# Patient Record
Sex: Male | Born: 1995 | Race: White | Hispanic: No | Marital: Single | Smoking: Never smoker
Health system: Southern US, Community
[De-identification: ages and names within clinical notes are randomized; demographics above are authoritative.]

## PROBLEM LIST (undated history)

## (undated) DIAGNOSIS — J45909 Unspecified asthma, uncomplicated: Secondary | ICD-10-CM

---
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
## 2013-03-28 ENCOUNTER — Emergency Department: Payer: Self-pay | Admitting: Emergency Medicine

## 2013-11-18 ENCOUNTER — Emergency Department: Payer: Self-pay | Admitting: Internal Medicine

## 2013-11-27 ENCOUNTER — Emergency Department: Payer: Self-pay | Admitting: Emergency Medicine

## 2014-01-03 ENCOUNTER — Emergency Department: Payer: Self-pay | Admitting: Emergency Medicine

## 2014-01-03 LAB — ETHANOL
Ethanol %: 0.29 % — ABNORMAL HIGH (ref 0.000–0.080)
Ethanol: 290 mg/dL

## 2015-01-18 ENCOUNTER — Emergency Department: Payer: Self-pay | Admitting: Emergency Medicine

## 2015-02-28 ENCOUNTER — Emergency Department: Payer: Self-pay | Admitting: Emergency Medicine

## 2015-03-19 ENCOUNTER — Emergency Department
Admit: 2015-03-19 | Disposition: A | Discharge status: Home / Self Care | Payer: Self-pay | Admitting: Emergency Medicine

## 2015-06-21 ENCOUNTER — Emergency Department: Payer: Medicaid Other

## 2015-06-21 ENCOUNTER — Emergency Department
Admission: EM | Admit: 2015-06-21 | Discharge: 2015-06-21 | Disposition: A | Discharge status: Home / Self Care | Payer: Medicaid Other | Attending: Emergency Medicine | Admitting: Emergency Medicine

## 2015-06-21 DIAGNOSIS — F10120 Alcohol abuse with intoxication, uncomplicated: Secondary | ICD-10-CM | POA: Insufficient documentation

## 2015-06-21 DIAGNOSIS — Y998 Other external cause status: Secondary | ICD-10-CM | POA: Insufficient documentation

## 2015-06-21 DIAGNOSIS — Y9389 Activity, other specified: Secondary | ICD-10-CM | POA: Diagnosis not present

## 2015-06-21 DIAGNOSIS — Y9289 Other specified places as the place of occurrence of the external cause: Secondary | ICD-10-CM | POA: Diagnosis not present

## 2015-06-21 DIAGNOSIS — Z880 Allergy status to penicillin: Secondary | ICD-10-CM | POA: Insufficient documentation

## 2015-06-21 DIAGNOSIS — S0083XA Contusion of other part of head, initial encounter: Secondary | ICD-10-CM | POA: Diagnosis not present

## 2015-06-21 DIAGNOSIS — F10129 Alcohol abuse with intoxication, unspecified: Secondary | ICD-10-CM | POA: Diagnosis present

## 2015-06-21 DIAGNOSIS — F10920 Alcohol use, unspecified with intoxication, uncomplicated: Secondary | ICD-10-CM

## 2015-06-21 HISTORY — DX: Unspecified asthma, uncomplicated: J45.909

## 2015-06-21 LAB — CBC
HEMATOCRIT: 48.1 % (ref 40.0–52.0)
HEMOGLOBIN: 16.7 g/dL (ref 13.0–18.0)
MCH: 32.2 pg (ref 26.0–34.0)
MCHC: 34.8 g/dL (ref 32.0–36.0)
MCV: 92.5 fL (ref 80.0–100.0)
Platelets: 246 10*3/uL (ref 150–440)
RBC: 5.2 MIL/uL (ref 4.40–5.90)
RDW: 12.5 % (ref 11.5–14.5)
WBC: 9.4 10*3/uL (ref 3.8–10.6)

## 2015-06-21 LAB — COMPREHENSIVE METABOLIC PANEL
ALK PHOS: 90 U/L (ref 38–126)
ALT: 58 U/L (ref 17–63)
AST: 41 U/L (ref 15–41)
Albumin: 4.7 g/dL (ref 3.5–5.0)
Anion gap: 11 (ref 5–15)
BUN: 8 mg/dL (ref 6–20)
CHLORIDE: 101 mmol/L (ref 101–111)
CO2: 26 mmol/L (ref 22–32)
Calcium: 8.7 mg/dL — ABNORMAL LOW (ref 8.9–10.3)
Creatinine, Ser: 0.85 mg/dL (ref 0.61–1.24)
GFR calc Af Amer: >60 mL/min (ref >60)
GLUCOSE: 102 mg/dL — AB (ref 65–99)
Potassium: 3.6 mmol/L (ref 3.5–5.1)
SODIUM: 138 mmol/L (ref 135–145)
Total Bilirubin: 0.5 mg/dL (ref 0.3–1.2)
Total Protein: 8.4 g/dL — ABNORMAL HIGH (ref 6.5–8.1)

## 2015-06-21 LAB — ACETAMINOPHEN LEVEL: Acetaminophen (Tylenol), Serum: <10 ug/mL — ABNORMAL LOW (ref 10–30)

## 2015-06-21 LAB — SALICYLATE LEVEL: Salicylate Lvl: <4 mg/dL (ref 2.8–30.0)

## 2015-06-21 LAB — ETHANOL: Alcohol, Ethyl (B): 381 mg/dL (ref <5)

## 2015-06-21 MED ORDER — SODIUM CHLORIDE 0.9 % IV BOLUS (SEPSIS)
1000.0000 mL | Freq: Once | INTRAVENOUS | Status: AC
  Administered 2015-06-21: 1000 mL via INTRAVENOUS

## 2015-06-21 MED ORDER — IBUPROFEN 800 MG PO TABS
800.0000 mg | ORAL_TABLET | ORAL | Status: AC
  Administered 2015-06-21: 800 mg via ORAL

## 2015-06-21 MED ORDER — IBUPROFEN 800 MG PO TABS
ORAL_TABLET | ORAL | Status: AC
  Administered 2015-06-21: 800 mg via ORAL
  Filled 2015-06-21: qty 1

## 2015-06-21 MED ORDER — SODIUM CHLORIDE 0.9 % IV BOLUS (SEPSIS): 1000.0000 mL | Freq: Once | INTRAVENOUS | Status: DC
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
## 2015-06-21 NOTE — ED Notes (Signed)
Pt's mother at bedside and was able to provide pt's medical history.

## 2015-06-21 NOTE — ED Provider Notes (Signed)
-----------------------------------------   9:01 AM on 06/21/2015 -----------------------------------------  Patient is fully awake and alert ambulating in room without difficulty or distress. Presently exhibits no signs of intoxication. He has clear speech, normal gait, and stable vital signs. I did discuss with the patient my concerns that he may be using alcohol to excess and abusing it which can be dangerous and/or deadly. Patient agrees he does travel using alcohol, and he is aware of the risks of using alcohol to excess.  He denies being homicidal or suicidal. He states he was drinking recreationally. He states he is a little sore across the bridge the nose but otherwise no distress concerns.  Patient calling his mother for ride home.  Discharge once sober ride arrives.  Sharyn CreamerMark Gera Inboden, MD 06/21/15 [phone]
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
## 2016-03-02 ENCOUNTER — Emergency Department: Payer: Medicaid Other

## 2016-03-02 ENCOUNTER — Emergency Department
Admission: EM | Admit: 2016-03-02 | Discharge: 2016-03-02 | Disposition: A | Discharge status: Home / Self Care | Payer: Medicaid Other | Attending: Emergency Medicine | Admitting: Emergency Medicine

## 2016-03-02 DIAGNOSIS — S62002A Unspecified fracture of navicular [scaphoid] bone of left wrist, initial encounter for closed fracture: Secondary | ICD-10-CM

## 2016-03-02 DIAGNOSIS — S0990XA Unspecified injury of head, initial encounter: Secondary | ICD-10-CM | POA: Diagnosis present

## 2016-03-02 DIAGNOSIS — J45909 Unspecified asthma, uncomplicated: Secondary | ICD-10-CM | POA: Insufficient documentation

## 2016-03-02 DIAGNOSIS — Y998 Other external cause status: Secondary | ICD-10-CM | POA: Insufficient documentation

## 2016-03-02 DIAGNOSIS — S0083XA Contusion of other part of head, initial encounter: Secondary | ICD-10-CM | POA: Diagnosis not present

## 2016-03-02 DIAGNOSIS — S0093XA Contusion of unspecified part of head, initial encounter: Secondary | ICD-10-CM

## 2016-03-02 DIAGNOSIS — Y929 Unspecified place or not applicable: Secondary | ICD-10-CM | POA: Diagnosis not present

## 2016-03-02 DIAGNOSIS — Y9389 Activity, other specified: Secondary | ICD-10-CM | POA: Diagnosis not present

## 2016-03-02 DIAGNOSIS — S62032A Displaced fracture of proximal third of navicular [scaphoid] bone of left wrist, initial encounter for closed fracture: Secondary | ICD-10-CM | POA: Insufficient documentation

## 2016-03-02 MED ORDER — IBUPROFEN 800 MG PO TABS: 800.0000 mg | ORAL_TABLET | Freq: Three times a day (TID) | ORAL | Status: DC | PRN
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
## 2016-03-02 NOTE — Discharge Instructions (Signed)
Contusion A contusion is a deep bruise. Contusions are the result of a blunt injury to tissues and muscle fibers under the skin. The injury causes bleeding under the skin. The skin overlying the contusion may turn blue, purple, or yellow. Minor injuries will give you a painless contusion, but more severe contusions may stay painful and swollen for a few weeks.  CAUSES  This condition is usually caused by a blow, trauma, or direct force to an area of the body. SYMPTOMS  Symptoms of this condition include:  Swelling of the injured area.  Pain and tenderness in the injured area.  Discoloration. The area may have redness and then turn blue, purple, or yellow. DIAGNOSIS  This condition is diagnosed based on a physical exam and medical history. An X-ray, CT scan, or MRI may be needed to determine if there are any associated injuries, such as broken bones (fractures). TREATMENT  Specific treatment for this condition depends on what area of the body was injured. In general, the best treatment for a contusion is resting, icing, applying pressure to (compression), and elevating the injured area. This is often called the RICE strategy. Over-the-counter anti-inflammatory medicines may also be recommended for pain control.  HOME CARE INSTRUCTIONS   Rest the injured area.  If directed, apply ice to the injured area:  Put ice in a plastic bag.  Place a towel between your skin and the bag.  Leave the ice on for 20 minutes, 2-3 times per day.  If directed, apply light compression to the injured area using an elastic bandage. Make sure the bandage is not wrapped too tightly. Remove and reapply the bandage as directed by your health care provider.  If possible, raise (elevate) the injured area above the level of your heart while you are sitting or lying down.  Take over-the-counter and prescription medicines only as told by your health care provider. SEEK MEDICAL CARE IF:  Your symptoms do not  improve after several days of treatment.  Your symptoms get worse.  You have difficulty moving the injured area. SEEK IMMEDIATE MEDICAL CARE IF:   You have severe pain.  You have numbness in a hand or foot.  Your hand or foot turns pale or cold.   This information is not intended to replace advice given to you by your health care provider. Make sure you discuss any questions you have with your health care provider.   Document Released: 09/08/2005 Document Revised: 08/20/2015 Document Reviewed: 04/16/2015 Elsevier Interactive Patient Education 2016 [employer]  Cast or Splint Care Casts and splints support injured limbs and keep bones from moving while they heal.  HOME CARE  Keep the cast or splint uncovered during the drying period.  A plaster cast can take 24 to 48 hours to dry.  A fiberglass cast will dry in less than 1 hour.  Do not rest the cast on anything harder than a pillow for 24 hours.  Do not put weight on your injured limb. Do not put pressure on the cast. Wait for your doctor's approval.  Keep the cast or splint dry.  Cover the cast or splint with a plastic bag during baths or wet weather.  If you have a cast over your chest and belly (trunk), take sponge baths until the cast is taken off.  If your cast gets wet, dry it with a towel or blow dryer. Use the cool setting on the blow dryer.  Keep your cast or splint clean. Wash a dirty cast  with a damp cloth.  Do not put any objects under your cast or splint.  Do not scratch the skin under the cast with an object. If itching is a problem, use a blow dryer on a cool setting over the itchy area.  Do not trim or cut your cast.  Do not take out the padding from inside your cast.  Exercise your joints near the cast as told by your doctor.  Raise (elevate) your injured limb on 1 or 2 pillows for the first 1 to 3 days. GET HELP IF:  Your cast or splint cracks.  Your cast or splint is too tight or too  loose.  You itch badly under the cast.  Your cast gets wet or has a soft spot.  You have a bad smell coming from the cast.  You get an object stuck under the cast.  Your skin around the cast becomes red or sore.  You have new or more pain after the cast is put on. GET HELP RIGHT AWAY IF:  You have fluid leaking through the cast.  You cannot move your fingers or toes.  Your fingers or toes turn blue or white or are cool, painful, or puffy (swollen).  You have tingling or lose feeling (numbness) around the injured area.  You have bad pain or pressure under the cast.  You have trouble breathing or have shortness of breath.  You have chest pain.   This information is not intended to replace advice given to you by your health care provider. Make sure you discuss any questions you have with your health care provider.   Document Released: 03/31/2011 Document Revised: 08/01/2013 Document Reviewed: 06/07/2013 Elsevier Interactive Patient Education 2016 [employer]  Facial or Scalp Contusion A facial or scalp contusion is a deep bruise on the face or head. Injuries to the face and head generally cause a lot of swelling, especially around the eyes. Contusions are the result of an injury that caused bleeding under the skin. The contusion may turn blue, purple, or yellow. Minor injuries will give you a painless contusion, but more severe contusions may stay painful and swollen for a few weeks.  CAUSES  A facial or scalp contusion is caused by a blunt injury or trauma to the face or head area.  SIGNS AND SYMPTOMS   Swelling of the injured area.   Discoloration of the injured area.   Tenderness, soreness, or pain in the injured area.  DIAGNOSIS  The diagnosis can be made by taking a medical history and doing a physical exam. An X-ray exam, CT scan, or MRI may be needed to determine if there are any associated injuries, such as broken bones (fractures). TREATMENT  Often, the  best treatment for a facial or scalp contusion is applying cold compresses to the injured area. Over-the-counter medicines may also be recommended for pain control.  HOME CARE INSTRUCTIONS   Only take over-the-counter or prescription medicines as directed by your health care provider.   Apply ice to the injured area.   Put ice in a plastic bag.   Place a towel between your skin and the bag.   Leave the ice on for 20 minutes, 2-3 times a day.  SEEK MEDICAL CARE IF:  You have bite problems.   You have pain with chewing.   You are concerned about facial defects. SEEK IMMEDIATE MEDICAL CARE IF:  You have severe pain or a headache that is not relieved by medicine.   You  have unusual sleepiness, confusion, or personality changes.   You throw up (vomit).   You have a persistent nosebleed.   You have double vision or blurred vision.   You have fluid drainage from your nose or ear.   You have difficulty walking or using your arms or legs.  MAKE SURE YOU:   Understand these instructions.  Will watch your condition.  Will get help right away if you are not doing well or get worse.   This information is not intended to replace advice given to you by your health care provider. Make sure you discuss any questions you have with your health care provider.   Document Released: 01/06/2005 Document Revised: 12/20/2014 Document Reviewed: 07/12/2013 Elsevier Interactive Patient Education 2016 [employer]  Cryotherapy Cryotherapy means treatment with cold. Ice or gel packs can be used to reduce both pain and swelling. Ice is the most helpful within the first 24 to 48 hours after an injury or flare-up from overusing a muscle or joint. Sprains, strains, spasms, burning pain, shooting pain, and aches can all be eased with ice. Ice can also be used when recovering from surgery. Ice is effective, has very few side effects, and is safe for most people to use. PRECAUTIONS  Ice  is not a safe treatment option for people with:  Raynaud phenomenon. This is a condition affecting small blood vessels in the extremities. Exposure to cold may cause your problems to return.  Cold hypersensitivity. There are many forms of cold hypersensitivity, including:  Cold urticaria. Red, itchy hives appear on the skin when the tissues begin to warm after being iced.  Cold erythema. This is a red, itchy rash caused by exposure to cold.  Cold hemoglobinuria. Red blood cells break down when the tissues begin to warm after being iced. The hemoglobin that carry oxygen are passed into the urine because they cannot combine with blood proteins fast enough.  Numbness or altered sensitivity in the area being iced. If you have any of the following conditions, do not use ice until you have discussed cryotherapy with your caregiver:  Heart conditions, such as arrhythmia, angina, or chronic heart disease.  High blood pressure.  Healing wounds or open skin in the area being iced.  Current infections.  Rheumatoid arthritis.  Poor circulation.  Diabetes. Ice slows the blood flow in the region it is applied. This is beneficial when trying to stop inflamed tissues from spreading irritating chemicals to surrounding tissues. However, if you expose your skin to cold temperatures for too long or without the proper protection, you can damage your skin or nerves. Watch for signs of skin damage due to cold. HOME CARE INSTRUCTIONS Follow these tips to use ice and cold packs safely.  Place a dry or damp towel between the ice and skin. A damp towel will cool the skin more quickly, so you may need to shorten the time that the ice is used.  For a more rapid response, add gentle compression to the ice.  Ice for no more than 10 to 20 minutes at a time. The bonier the area you are icing, the less time it will take to get the benefits of ice.  Check your skin after 5 minutes to make sure there are no signs  of a poor response to cold or skin damage.  Rest 20 minutes or more between uses.  Once your skin is numb, you can end your treatment. You can test numbness by very lightly touching your skin.  The touch should be so light that you do not see the skin dimple from the pressure of your fingertip. When using ice, most people will feel these normal sensations in this order: cold, burning, aching, and numbness.  Do not use ice on someone who cannot communicate their responses to pain, such as small children or people with dementia. HOW TO MAKE AN ICE PACK Ice packs are the most common way to use ice therapy. Other methods include ice massage, ice baths, and cryosprays. Muscle creams that cause a cold, tingly feeling do not offer the same benefits that ice offers and should not be used as a substitute unless recommended by your caregiver. To make an ice pack, do one of the following:  Place crushed ice or a bag of frozen vegetables in a sealable plastic bag. Squeeze out the excess air. Place this bag inside another plastic bag. Slide the bag into a pillowcase or place a damp towel between your skin and the bag.  Mix 3 parts water with 1 part rubbing alcohol. Freeze the mixture in a sealable plastic bag. When you remove the mixture from the freezer, it will be slushy. Squeeze out the excess air. Place this bag inside another plastic bag. Slide the bag into a pillowcase or place a damp towel between your skin and the bag. SEEK MEDICAL CARE IF:  You develop white spots on your skin. This may give the skin a blotchy (mottled) appearance.  Your skin turns blue or pale.  Your skin becomes waxy or hard.  Your swelling gets worse. MAKE SURE YOU:   Understand these instructions.  Will watch your condition.  Will get help right away if you are not doing well or get worse.   This information is not intended to replace advice given to you by your health care provider. Make sure you discuss any questions  you have with your health care provider.   Document Released: 07/26/2011 Document Revised: 12/20/2014 Document Reviewed: 07/26/2011 Elsevier Interactive Patient Education 2016 [employer]  Scaphoid Fracture, Wrist A fracture is a break in the bone. The bone you have broken often does not show up as a fracture on x-ray until later on in the healing phase. This bone is called the scaphoid bone. With this bone, your caregiver will often cast or splint your wrist as though it is fractured, even if a fracture is not seen on the x-ray. This is often done with wrist injuries in which there is tenderness at the base of the thumb. An x-ray at 1-3 weeks after your injury may confirm this fracture. A cast or splint is used to protect and keep your injured bone in good position for healing. The cast or splint will be on generally for about 6 to 16 weeks, depending on your health, age, the fracture location and how quickly you heal. Another name for the scaphoid bone is the navicular bone. HOME CARE INSTRUCTIONS   To lessen the swelling and pain, keep the injured part elevated above your heart while sitting or lying down.  Apply ice to the injury for 15-20 minutes, 03-04 times per day while awake, for 2 days. Put the ice in a plastic bag and place a thin towel between the bag of ice and your cast.  If you have a plaster or fiberglass cast or splint:  Do not try to scratch the skin under the cast using sharp or pointed objects.  Check the skin around the cast every day. You may  put lotion on any red or sore areas.  Keep your cast or splint dry and clean.  If you have a plaster splint:  Wear the splint as directed.  You may loosen the elastic bandage around the splint if your fingers become numb, tingle, or turn cold or blue.  If you have been put in a removable splint, wear and use as directed.  Do not put pressure on any part of your cast or splint; it may deform or break. Rest your cast or splint  only on a pillow the first 24 hours until it is fully hardened.  Your cast or splint can be protected during bathing with a plastic bag. Do not lower the cast or splint into water.  Only take over-the-counter or prescription medicines for pain, discomfort, or fever as directed by your caregiver.  If your caregiver has given you a follow up appointment, it is very important to keep that appointment. Not keeping the appointment could result in chronic pain and decreased function. If there is any problem keeping the appointment, you must call back to this facility for assistance. SEEK IMMEDIATE MEDICAL CARE IF:   Your cast gets damaged, wet or breaks.  You have continued severe pain or more swelling than you did before the cast or splint was put on.  Your skin or nails below the injury turn blue or gray, or feel cold or numb.  You have tingling or burning pain in your fingers or increasing pain with movement of your fingers   This information is not intended to replace advice given to you by your health care provider. Make sure you discuss any questions you have with your health care provider.   Document Released: 11/19/2002 Document Revised: 02/21/2012 Document Reviewed: 06/11/2015 Elsevier Interactive Patient Education [employer]
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
## 2016-06-27 ENCOUNTER — Encounter: Payer: Self-pay | Admitting: Emergency Medicine

## 2016-06-27 DIAGNOSIS — R1110 Vomiting, unspecified: Secondary | ICD-10-CM | POA: Diagnosis not present

## 2016-06-27 DIAGNOSIS — F17290 Nicotine dependence, other tobacco product, uncomplicated: Secondary | ICD-10-CM | POA: Insufficient documentation

## 2016-06-27 DIAGNOSIS — E860 Dehydration: Secondary | ICD-10-CM | POA: Insufficient documentation

## 2016-06-27 DIAGNOSIS — J45909 Unspecified asthma, uncomplicated: Secondary | ICD-10-CM | POA: Diagnosis not present

## 2016-06-27 DIAGNOSIS — R1084 Generalized abdominal pain: Secondary | ICD-10-CM | POA: Diagnosis present

## 2016-06-27 DIAGNOSIS — M6282 Rhabdomyolysis: Secondary | ICD-10-CM | POA: Insufficient documentation

## 2016-06-27 LAB — CBC
HCT: 53.2 % — ABNORMAL HIGH (ref 40.0–52.0)
Hemoglobin: 18.7 g/dL — ABNORMAL HIGH (ref 13.0–18.0)
MCH: 31.9 pg (ref 26.0–34.0)
MCHC: 35.2 g/dL (ref 32.0–36.0)
MCV: 90.5 fL (ref 80.0–100.0)
PLATELETS: 296 10*3/uL (ref 150–440)
RBC: 5.88 MIL/uL (ref 4.40–5.90)
RDW: 12.7 % (ref 11.5–14.5)
WBC: 14.8 10*3/uL — AB (ref 3.8–10.6)

## 2016-06-27 LAB — COMPREHENSIVE METABOLIC PANEL
ALT: 80 U/L — AB (ref 17–63)
AST: 60 U/L — ABNORMAL HIGH (ref 15–41)
Albumin: 5.9 g/dL — ABNORMAL HIGH (ref 3.5–5.0)
Alkaline Phosphatase: 98 U/L (ref 38–126)
Anion gap: 15 (ref 5–15)
BILIRUBIN TOTAL: 1.2 mg/dL (ref 0.3–1.2)
BUN: 35 mg/dL — ABNORMAL HIGH (ref 6–20)
CHLORIDE: 92 mmol/L — AB (ref 101–111)
CO2: 26 mmol/L (ref 22–32)
CREATININE: 2.11 mg/dL — AB (ref 0.61–1.24)
Calcium: 10.3 mg/dL (ref 8.9–10.3)
GFR, EST AFRICAN AMERICAN: 50 mL/min — AB (ref >60)
GFR, EST NON AFRICAN AMERICAN: 43 mL/min — AB (ref >60)
Glucose, Bld: 144 mg/dL — ABNORMAL HIGH (ref 65–99)
Potassium: 4.3 mmol/L (ref 3.5–5.1)
Sodium: 133 mmol/L — ABNORMAL LOW (ref 135–145)
TOTAL PROTEIN: 9.6 g/dL — AB (ref 6.5–8.1)

## 2016-06-27 LAB — TROPONIN I

## 2016-06-27 NOTE — ED Notes (Signed)
Pt states vomiting since wendsday. Pt states has generalized abdominal pain. Pt states yesterday had "purple blood in my vomit". Pt with history of etoh abuse, has not had etoh in one week. Pt denies diarrhea, last bowel movement per pt was brown.

## 2016-06-28 ENCOUNTER — Emergency Department
Admission: EM | Admit: 2016-06-28 | Discharge: 2016-06-28 | Disposition: A | Discharge status: Home / Self Care | Payer: Medicaid Other | Attending: Emergency Medicine | Admitting: Emergency Medicine

## 2016-06-28 ENCOUNTER — Emergency Department: Payer: Medicaid Other

## 2016-06-28 DIAGNOSIS — M6282 Rhabdomyolysis: Secondary | ICD-10-CM

## 2016-06-28 DIAGNOSIS — E860 Dehydration: Secondary | ICD-10-CM

## 2016-06-28 LAB — LIPASE, BLOOD: LIPASE: 14 U/L (ref 11–51)

## 2016-06-28 LAB — BASIC METABOLIC PANEL
Anion gap: 7 (ref 5–15)
BUN: 28 mg/dL — AB (ref 6–20)
CALCIUM: 8.7 mg/dL — AB (ref 8.9–10.3)
CHLORIDE: 96 mmol/L — AB (ref 101–111)
CO2: 30 mmol/L (ref 22–32)
CREATININE: 1.33 mg/dL — AB (ref 0.61–1.24)
Glucose, Bld: 99 mg/dL (ref 65–99)
Potassium: 3.7 mmol/L (ref 3.5–5.1)
SODIUM: 133 mmol/L — AB (ref 135–145)

## 2016-06-28 LAB — TYPE AND SCREEN
ABO/RH(D): A POS
ANTIBODY SCREEN: NEGATIVE

## 2016-06-28 LAB — CK
CK TOTAL: 748 U/L — AB (ref 49–397)
CK TOTAL: 544 U/L — AB (ref 49–397)

## 2016-06-28 LAB — ETHANOL

## 2016-06-28 MED ORDER — DIATRIZOATE MEGLUMINE & SODIUM 66-10 % PO SOLN
15.0000 mL | ORAL | Status: AC
  Administered 2016-06-28 (×2): 15 mL via ORAL

## 2016-06-28 MED ORDER — SODIUM CHLORIDE 0.9 % IV BOLUS (SEPSIS)
1000.0000 mL | Freq: Once | INTRAVENOUS | Status: AC
  Administered 2016-06-28: 1000 mL via INTRAVENOUS

## 2016-06-28 MED ORDER — ONDANSETRON HCL 4 MG/2ML IJ SOLN
4.0000 mg | Freq: Once | INTRAMUSCULAR | Status: AC
  Administered 2016-06-28: 4 mg via INTRAVENOUS
  Filled 2016-06-28: qty 2

## 2016-06-28 NOTE — ED Notes (Signed)
Patient transported to CT 

## 2016-06-28 NOTE — ED Provider Notes (Signed)
Patient signed out to me by Dr. Manson PasseyBrown, he asked me to follow-up on lipase and discharge if normal. Lipase is normal. Vitals normal okay for discharge  Jene Everyobert Marika Mahaffy, MD 06/28/16 [phone]

## 2016-06-28 NOTE — Discharge Instructions (Signed)

## 2016-06-28 NOTE — ED Notes (Signed)
Pt is alert and oriented. NAD. Mother at bedside. Respirations unlabored. Skin warm dry and pink.

## 2016-06-28 NOTE — ED Notes (Signed)
Given water, ok per dr Cyril Loosenkinner

## 2016-06-28 NOTE — ED Provider Notes (Signed)
[facility] Emergency Department Provider Note  ____________________________________________  Time seen: 2:00 AM  I have reviewed the triage vital signs and the nursing notes.   HISTORY  Chief Complaint GI Bleeding     HPI Lawrence A Wyvonnia DuskyDuckworth Jr. is a 20 y.o. male presents with generalized abdominal discomfortcurrently 5 out of 10 accompanied by vomiting 4 days. Patient admits to history of heavy alcohol intake daily however states that he has not drank in approximately one week. 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     Past Medical History  Diagnosis Date  . Asthma     There are no active problems to display for this patient.   No past surgical history on file.  Current Outpatient Rx  Name  Route  Sig  Dispense  Refill  . ibuprofen (ADVIL,MOTRIN) 800 MG tablet   Oral   Take 1 tablet (800 mg total) by mouth every 8 (eight) hours as needed. Patient not taking: Reported on 06/28/2016   30 tablet   0     Allergies Penicillins  No family history on file.  Social History Social History  Substance Use Topics  . Smoking status: Never Smoker   . Smokeless tobacco: Current User  . Alcohol Use: Yes    Review of Systems  Constitutional: Negative for fever. Eyes: Negative for visual changes. ENT: Negative for sore throat. Cardiovascular: Negative for chest pain. Respiratory: Negative for shortness of breath. Gastrointestinal: Positive for abdominal pain and vomiting Genitourinary: Negative for dysuria. Musculoskeletal: Negative for back pain. Skin: Negative for rash. Neurological: Negative for headaches, focal weakness or numbness.   10-point ROS otherwise negative.  ____________________________________________   PHYSICAL EXAM:  VITAL SIGNS: ED Triage Vitals  Enc Vitals Group     BP 06/27/16 2135 128/77 mmHg     Pulse Rate 06/27/16 2135 100     Resp 06/27/16 2135  16     Temp 06/27/16 2135 98.7 F (37.1 C)     Temp src --      SpO2 06/27/16 2135 100 %     Weight 06/27/16 2135 200 lb (90.719 kg)     Height 06/27/16 2135 5\' 7"  (1.702 m)     Head Cir --      Peak Flow --      Pain Score 06/27/16 2136 6     Pain Loc --      Pain Edu? --      Excl. in GC? --     Constitutional: Alert and oriented. Well appearing and in no distress. Eyes: Conjunctivae are normal. PERRL. Normal extraocular movements. ENT   Head: Normocephalic and atraumatic.   Nose: No congestion/rhinnorhea.   Mouth/Throat: Mucous membranes are moist.   Neck: No stridor. Hematological/Lymphatic/Immunilogical: No cervical lymphadenopathy. Cardiovascular: Normal rate, regular rhythm. Normal and symmetric distal pulses are present in all extremities. No murmurs, rubs, or gallops. Respiratory: Normal respiratory effort without tachypnea nor retractions. Breath sounds are clear and equal bilaterally. No wheezes/rales/rhonchi. Gastrointestinal: Soft and nontender. No distention. There is no CVA tenderness. Genitourinary: deferred Musculoskeletal: Nontender with normal range of motion in all extremities. No joint effusions.  No lower extremity tenderness nor edema. Neurologic:  Normal speech and language. No gross focal neurologic deficits are appreciated. Speech is normal.  Skin:  Skin is warm, dry and intact. No rash noted. Psychiatric: Mood and affect are normal. Speech and behavior are normal. Patient exhibits appropriate insight  and judgment.  ____________________________________________    LABS (pertinent positives/negatives)  Labs Reviewed  COMPREHENSIVE METABOLIC PANEL - Abnormal; Notable for the following:    Sodium 133 (*)    Chloride 92 (*)    Glucose, Bld 144 (*)    BUN 35 (*)    Creatinine, Ser 2.11 (*)    Total Protein 9.6 (*)    Albumin 5.9 (*)    AST 60 (*)    ALT 80 (*)    GFR calc non Af Amer 43 (*)    GFR calc Af Amer 50 (*)    All other  components within normal limits  CBC - Abnormal; Notable for the following:    WBC 14.8 (*)    Hemoglobin 18.7 (*)    HCT 53.2 (*)    All other components within normal limits  CK - Abnormal; Notable for the following:    Total CK 748 (*)    All other components within normal limits  TROPONIN I  ETHANOL  BASIC METABOLIC PANEL  CK  LIPASE, BLOOD  POC OCCULT BLOOD, ED  TYPE AND SCREEN     ____________________________________________   EKG  ED ECG REPORT I, Lawrence Griffith, the attending physician, personally viewed and interpreted this ECG.   Date: 06/28/2016  EKG Time: 9:42PM  Rate: 112  Rhythm: Sinus tachycardia  Axis: Normal  Intervals: Normal  ST&T Change: None   ____________________________________________    RADIOLOGY   CT Abdomen Pelvis Wo Contrast (Final result) Result time: 06/28/16 05:08:04   Final result by Rad Results In Interface (06/28/16 05:08:04)   Narrative:   CLINICAL DATA: Right lower quadrant and left lower quadrant abdominal pain.  EXAM: CT ABDOMEN AND PELVIS WITHOUT CONTRAST  TECHNIQUE: Multidetector CT imaging of the abdomen and pelvis was performed following the standard protocol without IV contrast.  COMPARISON: None.  FINDINGS: Lower chest and abdominal wall: No contributory findings.  Hepatobiliary: No focal liver abnormality.No evidence of biliary obstruction or stone.  Pancreas: Unremarkable.  Spleen: Unremarkable.  Adrenals/Urinary Tract: Negative adrenals. No hydronephrosis or stone. Unremarkable bladder.  Stomach/Bowel: No obstruction or inflammation. No appendicitis.  Reproductive:No pathologic findings.  Vascular/Lymphatic: No acute vascular abnormality. No mass or adenopathy.  Other: No ascites or pneumoperitoneum.  Musculoskeletal: Ossification contiguous with the anterior subtrochanteric left femur which does not have medullary continuity and is new since 2007 radiography. This has the  appearance of heterotopic ossification. No associated mass lesion or aggressive periosteal change.  IMPRESSION: 1. No acute finding. No explanation for abdominal pain. 2. 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   Electronically Signed By: Marnee Spring M.D. On: 06/28/2016 05:08            Procedures     INITIAL IMPRESSION / ASSESSMENT AND PLAN / ED COURSE  Pertinent labs & imaging results that were available during my care of the patient were reviewed by me and considered in my medical decision making (see chart for details).  Patient received 2 L IV normal saline with improvement of creatinine from 2.1 1.33 CK total improved from 740 07/18/1945. Lipase pending patient's care transferred to Dr. Cyril Loosen  ____________________________________________   FINAL CLINICAL IMPRESSION(S) / ED DIAGNOSES  Final diagnoses:  Non-traumatic rhabdomyolysis      Darci Current, MD 06/28/16 [phone]

## 2017-05-25 ENCOUNTER — Emergency Department: Payer: Medicaid Other

## 2017-05-25 ENCOUNTER — Emergency Department
Admission: EM | Admit: 2017-05-25 | Discharge: 2017-05-25 | Disposition: A | Discharge status: Home / Self Care | Payer: Medicaid Other | Attending: Emergency Medicine | Admitting: Emergency Medicine

## 2017-05-25 ENCOUNTER — Encounter: Payer: Self-pay | Admitting: Emergency Medicine

## 2017-05-25 DIAGNOSIS — Y9269 Other specified industrial and construction area as the place of occurrence of the external cause: Secondary | ICD-10-CM | POA: Diagnosis not present

## 2017-05-25 DIAGNOSIS — Y93H3 Activity, building and construction: Secondary | ICD-10-CM | POA: Insufficient documentation

## 2017-05-25 DIAGNOSIS — J45909 Unspecified asthma, uncomplicated: Secondary | ICD-10-CM | POA: Diagnosis not present

## 2017-05-25 DIAGNOSIS — Y33XXXA Other specified events, undetermined intent, initial encounter: Secondary | ICD-10-CM | POA: Insufficient documentation

## 2017-05-25 DIAGNOSIS — S62032K Displaced fracture of proximal third of navicular [scaphoid] bone of left wrist, subsequent encounter for fracture with nonunion: Secondary | ICD-10-CM | POA: Insufficient documentation

## 2017-05-25 DIAGNOSIS — S6992XA Unspecified injury of left wrist, hand and finger(s), initial encounter: Secondary | ICD-10-CM | POA: Diagnosis present

## 2017-05-25 DIAGNOSIS — Y990 Civilian activity done for income or pay: Secondary | ICD-10-CM | POA: Insufficient documentation

## 2017-05-25 MED ORDER — PREDNISONE 10 MG PO TABS: ORAL_TABLET | ORAL | 0 refills | Status: DC

## 2017-05-25 NOTE — Discharge Instructions (Signed)
Wear a wrist brace for added support. Begin taking prednisone as directed beginning with 6 tablets today and tapering down. You  may use ice to the wrist as needed for pain. You will need to follow up with Dr. Rosita KeaMenz for your nonhealing fracture

## 2017-05-25 NOTE — ED Triage Notes (Addendum)
Pt reports left wrist pain x1 week. Reports he works in [employer]. Denies known injury.

## 2017-05-25 NOTE — ED Provider Notes (Signed)
[facility] Emergency Department Provider Note ____________________________________________  Time seen: 10:32 AM  I have reviewed the triage vital signs and the nursing notes.  HISTORY  Chief Complaint  Wrist Pain   HPI Lawrence Griffith. is a 21 y.o. male is here complaining of left wrist pain for 1 week. Patient states that he is in [employer] work and does not recall an injury. He has been taking over-the-counter medication without any relief of his pain. He states that he continues to have pain even when he is trying to sleep. He rates his pain as 9/10.    Past Medical History:  Diagnosis Date  . Asthma     There are no active problems to display for this patient.   No past surgical history on file.  Prior to Admission medications   Medication Sig Start Date End Date Taking? Authorizing Provider  predniSONE (DELTASONE) 10 MG tablet Take 6 tablets  today, on day 2 take 5 tablets, day 3 take 4 tablets, day 4 take 3 tablets, day 5 take  2 tablets and 1 tablet the last day 05/25/17   Tommi Rumps, PA-C    Allergies Penicillins  No family history on file.  Social History Social History  Substance Use Topics  . Smoking status: Never Smoker  . Smokeless tobacco: Current User  . Alcohol use Yes    Review of Systems  Constitutional: Negative for fever. Cardiovascular: Negative for chest pain. Respiratory: Negative for shortness of breath. Musculoskeletal: Positive for left wrist pain. Skin: Negative for rash. Neurological: Negative for  focal weakness or numbness. ____________________________________________  PHYSICAL EXAM:  VITAL SIGNS: ED Triage Vitals [05/25/17 1027]  Enc Vitals Group     BP      Pulse      Resp      Temp      Temp src      SpO2      Weight      Height      Head Circumference      Peak Flow      Pain Score 9     Pain Loc      Pain Edu?      Excl. in GC?     Constitutional: Alert and oriented.  Well appearing and in no distress. Head: Normocephalic and atraumatic. Eyes: Conjunctivae are normal.  Neck: No stridor Cardiovascular: Normal rate, regular rhythm. Normal distal pulses. Respiratory: Normal respiratory effort. No wheezes/rales/rhonchi. Musculoskeletal: Examination of the left wrist there is no gross deformity and no soft tissue swelling present. There is no obvious signs of injury. Patient is able to flex and extend without restriction. He states it is point tender to palpation especially on the distal radial aspect. Motor sensory function to all digits is within normal limits. Good muscle strength and tone. Neurologic:  Normal gait without ataxia. Normal speech and language. No gross focal neurologic deficits are appreciated. Skin:  Skin is warm, dry and intact. No erythema, ecchymosis or abrasions seen. Psychiatric: Mood and affect are normal. Patient exhibits appropriate insight and judgment.    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 No acute abnormality identified.   I, Tommi Rumps, personally viewed and evaluated these images (plain radiographs) as part of my medical decision making, as well as reviewing the written report by the radiologist.   INITIAL IMPRESSION / ASSESSMENT AND PLAN / ED COURSE  Patient states he is unaware  of any past history of injury to his left ribs which would've caused a fracture. Patient was given a prescription for prednisone 60 mg 6 day taper for his inflammation and pain. He is also placed in a Velcro cockup wrist splint. We discussed following up with Dr. Rosita KeaMenz who is on-call for orthopedics. He is encouraged to call make an appointment. He is also wetting take Tylenol with this medication.    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

## 2017-05-25 NOTE — ED Notes (Signed)
See triage note  States he works [employer]  Developed pain to left wrist several weeks ago  But pain has increased this week  Denies any specific injury  Positive pulses

## 2017-09-07 ENCOUNTER — Emergency Department: Payer: Medicaid Other

## 2017-09-07 ENCOUNTER — Encounter: Payer: Self-pay | Admitting: Emergency Medicine

## 2017-09-07 ENCOUNTER — Emergency Department
Admission: EM | Admit: 2017-09-07 | Discharge: 2017-09-07 | Disposition: A | Discharge status: Home / Self Care | Payer: Medicaid Other | Attending: Emergency Medicine | Admitting: Emergency Medicine

## 2017-09-07 DIAGNOSIS — M25511 Pain in right shoulder: Secondary | ICD-10-CM

## 2017-09-07 DIAGNOSIS — J45909 Unspecified asthma, uncomplicated: Secondary | ICD-10-CM | POA: Diagnosis not present

## 2017-09-07 MED ORDER — KETOROLAC TROMETHAMINE 60 MG/2ML IM SOLN
60.0000 mg | Freq: Once | INTRAMUSCULAR | Status: AC
  Administered 2017-09-07: 60 mg via INTRAMUSCULAR
  Filled 2017-09-07: qty 2
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
## 2017-09-07 NOTE — Discharge Instructions (Signed)
Please follow up with your primary care physician.

## 2017-09-28 ENCOUNTER — Emergency Department
Admission: EM | Admit: 2017-09-28 | Discharge: 2017-09-29 | Disposition: A | Discharge status: Home / Self Care | Payer: Medicaid Other | Attending: Emergency Medicine | Admitting: Emergency Medicine

## 2017-09-28 ENCOUNTER — Emergency Department: Payer: Medicaid Other

## 2017-09-28 ENCOUNTER — Encounter: Payer: Self-pay | Admitting: *Deleted

## 2017-09-28 DIAGNOSIS — M25531 Pain in right wrist: Secondary | ICD-10-CM | POA: Diagnosis not present

## 2017-09-28 DIAGNOSIS — M25511 Pain in right shoulder: Secondary | ICD-10-CM | POA: Insufficient documentation

## 2017-09-28 DIAGNOSIS — J45909 Unspecified asthma, uncomplicated: Secondary | ICD-10-CM | POA: Diagnosis not present

## 2017-09-28 MED ORDER — KETOROLAC TROMETHAMINE 30 MG/ML IJ SOLN
INTRAMUSCULAR | Status: AC
  Filled 2017-09-28: qty 1

## 2017-09-28 MED ORDER — KETOROLAC TROMETHAMINE 30 MG/ML IJ SOLN
60.0000 mg | Freq: Once | INTRAMUSCULAR | Status: AC
Start: 2017-09-28 — End: 2017-09-28
  Administered 2017-09-28: 60 mg via INTRAMUSCULAR
  Filled 2017-09-28: qty 2
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
## 2017-09-28 NOTE — ED Notes (Signed)
ED Provider at bedside. 

## 2017-09-29 ENCOUNTER — Emergency Department: Payer: Medicaid Other

## 2017-09-29 MED ORDER — IBUPROFEN 800 MG PO TABS: 800.0000 mg | ORAL_TABLET | Freq: Three times a day (TID) | ORAL | 0 refills | Status: DC | PRN

## 2017-09-29 NOTE — Discharge Instructions (Signed)
You may continue to use the sling that you already have. You may take Motrin as needed for pain. Return to the ER for worsening symptoms, persistent vomiting, difficulty breathing or other concerns.

## 2017-10-20 ENCOUNTER — Emergency Department
Admission: EM | Admit: 2017-10-20 | Discharge: 2017-10-20 | Disposition: A | Discharge status: Home / Self Care | Payer: Medicaid Other | Attending: Emergency Medicine | Admitting: Emergency Medicine

## 2017-10-20 ENCOUNTER — Encounter: Payer: Self-pay | Admitting: Emergency Medicine

## 2017-10-20 DIAGNOSIS — F17220 Nicotine dependence, chewing tobacco, uncomplicated: Secondary | ICD-10-CM | POA: Insufficient documentation

## 2017-10-20 DIAGNOSIS — Y929 Unspecified place or not applicable: Secondary | ICD-10-CM | POA: Diagnosis not present

## 2017-10-20 DIAGNOSIS — Y939 Activity, unspecified: Secondary | ICD-10-CM | POA: Diagnosis not present

## 2017-10-20 DIAGNOSIS — S0181XA Laceration without foreign body of other part of head, initial encounter: Secondary | ICD-10-CM

## 2017-10-20 DIAGNOSIS — S01112A Laceration without foreign body of left eyelid and periocular area, initial encounter: Secondary | ICD-10-CM | POA: Diagnosis not present

## 2017-10-20 DIAGNOSIS — J45909 Unspecified asthma, uncomplicated: Secondary | ICD-10-CM | POA: Diagnosis not present

## 2017-10-20 DIAGNOSIS — Y999 Unspecified external cause status: Secondary | ICD-10-CM | POA: Diagnosis not present

## 2017-10-20 DIAGNOSIS — S0993XA Unspecified injury of face, initial encounter: Secondary | ICD-10-CM | POA: Diagnosis present

## 2017-10-20 MED ORDER — LIDOCAINE HCL (PF) 1 % IJ SOLN
INTRAMUSCULAR | Status: AC
  Administered 2017-10-20: 06:00:00
  Filled 2017-10-20: qty 5

## 2017-10-20 MED ORDER — HYDROCODONE-ACETAMINOPHEN 5-325 MG PO TABS
1.0000 | ORAL_TABLET | Freq: Once | ORAL | Status: AC
  Administered 2017-10-20: 1 via ORAL
  Filled 2017-10-20: qty 1
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
## 2017-12-15 ENCOUNTER — Emergency Department
Admission: EM | Admit: 2017-12-15 | Discharge: 2017-12-15 | Disposition: A | Discharge status: Home / Self Care | Payer: Medicaid Other | Attending: Emergency Medicine | Admitting: Emergency Medicine

## 2017-12-15 ENCOUNTER — Other Ambulatory Visit: Payer: Self-pay

## 2017-12-15 ENCOUNTER — Encounter: Payer: Self-pay | Admitting: Emergency Medicine

## 2017-12-15 DIAGNOSIS — Y33XXXA Other specified events, undetermined intent, initial encounter: Secondary | ICD-10-CM | POA: Insufficient documentation

## 2017-12-15 DIAGNOSIS — Y939 Activity, unspecified: Secondary | ICD-10-CM | POA: Diagnosis not present

## 2017-12-15 DIAGNOSIS — J45909 Unspecified asthma, uncomplicated: Secondary | ICD-10-CM | POA: Diagnosis not present

## 2017-12-15 DIAGNOSIS — Y929 Unspecified place or not applicable: Secondary | ICD-10-CM | POA: Insufficient documentation

## 2017-12-15 DIAGNOSIS — Y999 Unspecified external cause status: Secondary | ICD-10-CM | POA: Insufficient documentation

## 2017-12-15 DIAGNOSIS — S0501XA Injury of conjunctiva and corneal abrasion without foreign body, right eye, initial encounter: Secondary | ICD-10-CM | POA: Insufficient documentation

## 2017-12-15 DIAGNOSIS — S0591XA Unspecified injury of right eye and orbit, initial encounter: Secondary | ICD-10-CM | POA: Diagnosis present

## 2017-12-15 DIAGNOSIS — H1131 Conjunctival hemorrhage, right eye: Secondary | ICD-10-CM

## 2017-12-15 MED ORDER — FLUORESCEIN SODIUM 1 MG OP STRP
1.0000 | ORAL_STRIP | Freq: Once | OPHTHALMIC | Status: DC
  Filled 2017-12-15: qty 1

## 2017-12-15 MED ORDER — TETRACAINE HCL 0.5 % OP SOLN
2.0000 [drp] | Freq: Once | OPHTHALMIC | Status: DC
  Filled 2017-12-15: qty 4

## 2017-12-15 MED ORDER — ERYTHROMYCIN 5 MG/GM OP OINT: TOPICAL_OINTMENT | Freq: Three times a day (TID) | OPHTHALMIC | 0 refills | Status: AC
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
## 2018-01-13 ENCOUNTER — Other Ambulatory Visit: Payer: Self-pay

## 2018-01-13 DIAGNOSIS — R51 Headache: Secondary | ICD-10-CM | POA: Diagnosis present

## 2018-01-13 DIAGNOSIS — F17220 Nicotine dependence, chewing tobacco, uncomplicated: Secondary | ICD-10-CM | POA: Insufficient documentation

## 2018-01-13 DIAGNOSIS — J45909 Unspecified asthma, uncomplicated: Secondary | ICD-10-CM | POA: Diagnosis not present

## 2018-01-13 DIAGNOSIS — Z23 Encounter for immunization: Secondary | ICD-10-CM | POA: Diagnosis not present

## 2018-01-13 DIAGNOSIS — H1132 Conjunctival hemorrhage, left eye: Secondary | ICD-10-CM | POA: Insufficient documentation

## 2018-01-13 DIAGNOSIS — F10920 Alcohol use, unspecified with intoxication, uncomplicated: Secondary | ICD-10-CM | POA: Diagnosis not present
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
## 2018-01-14 ENCOUNTER — Emergency Department
Admission: EM | Admit: 2018-01-14 | Discharge: 2018-01-14 | Disposition: A | Discharge status: Home / Self Care | Payer: Medicaid Other | Attending: Emergency Medicine | Admitting: Emergency Medicine

## 2018-01-14 ENCOUNTER — Emergency Department: Payer: Medicaid Other

## 2018-01-14 DIAGNOSIS — H1132 Conjunctival hemorrhage, left eye: Secondary | ICD-10-CM

## 2018-01-14 DIAGNOSIS — F10920 Alcohol use, unspecified with intoxication, uncomplicated: Secondary | ICD-10-CM

## 2018-01-14 MED ORDER — TETANUS-DIPHTH-ACELL PERTUSSIS 5-2.5-18.5 LF-MCG/0.5 IM SUSP
0.5000 mL | Freq: Once | INTRAMUSCULAR | Status: AC
  Administered 2018-01-14: 0.5 mL via INTRAMUSCULAR
  Filled 2018-01-14: qty 0.5
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
## 2018-01-14 NOTE — ED Notes (Signed)
Patient transported to CT 
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
## 2018-01-14 NOTE — ED Notes (Signed)
ED Provider at bedside. 

## 2018-01-14 NOTE — Discharge Instructions (Signed)
Fortunately today your CT scans were reassuring.  Please follow-up with primary care as needed and return to the emergency department for any concerns.  It was a pleasure to take care of you today, and thank you for coming to our emergency department.  If you have any questions or concerns before leaving please ask the nurse to grab me and I'm more than happy to go through your aftercare instructions again.  If you were prescribed any opioid pain medication today such as Norco, Vicodin, Percocet, morphine, hydrocodone, or oxycodone please make sure you do not drive when you are taking this medication as it can alter your ability to drive safely.  If you have any concerns once you are home that you are not improving or are in fact getting worse before you can make it to your follow-up appointment, please do not hesitate to call [phone] and come back for further evaluation.  Merrily Brittle, MD  Results for orders placed or performed during the hospital encounter of 06/28/16  Comprehensive metabolic panel  Result Value Ref Range   Sodium 133 (L) 135 - 145 mmol/L   Potassium 4.3 3.5 - 5.1 mmol/L   Chloride 92 (L) 101 - 111 mmol/L   CO2 26 22 - 32 mmol/L   Glucose, Bld 144 (H) 65 - 99 mg/dL   BUN 35 (H) 6 - 20 mg/dL   Creatinine, Ser 1.61 (H) 0.61 - 1.24 mg/dL   Calcium 09.6 8.9 - 04.5 mg/dL   Total Protein 9.6 (H) 6.5 - 8.1 g/dL   Albumin 5.9 (H) 3.5 - 5.0 g/dL   AST 60 (H) 15 - 41 U/L   ALT 80 (H) 17 - 63 U/L   Alkaline Phosphatase 98 38 - 126 U/L   Total Bilirubin 1.2 0.3 - 1.2 mg/dL   GFR calc non Af Amer 43 (L) >60 mL/min   GFR calc Af Amer 50 (L) >60 mL/min   Anion gap 15 5 - 15  CBC  Result Value Ref Range   WBC 14.8 (H) 3.8 - 10.6 K/uL   RBC 5.88 4.40 - 5.90 MIL/uL   Hemoglobin 18.7 (H) 13.0 - 18.0 g/dL   HCT 40.9 (H) 81.1 - 91.4 %   MCV 90.5 80.0 - 100.0 fL   MCH 31.9 26.0 - 34.0 pg   MCHC 35.2 32.0 - 36.0 g/dL   RDW 78.2 95.6 - 21.3 %   Platelets 296 150 - 440 K/uL  Troponin  I  Result Value Ref Range   Troponin I <0.03 <0.03 ng/mL  Ethanol  Result Value Ref Range   Alcohol, Ethyl (B) <5 <5 mg/dL  CK  Result Value Ref Range   Total CK 748 (H) 49 - 397 U/L  Basic metabolic panel  Result Value Ref Range   Sodium 133 (L) 135 - 145 mmol/L   Potassium 3.7 3.5 - 5.1 mmol/L   Chloride 96 (L) 101 - 111 mmol/L   CO2 30 22 - 32 mmol/L   Glucose, Bld 99 65 - 99 mg/dL   BUN 28 (H) 6 - 20 mg/dL   Creatinine, Ser 0.86 (H) 0.61 - 1.24 mg/dL   Calcium 8.7 (L) 8.9 - 10.3 mg/dL   GFR calc non Af Amer >60 >60 mL/min   GFR calc Af Amer >60 >60 mL/min   Anion gap 7 5 - 15  CK  Result Value Ref Range   Total CK 544 (H) 49 - 397 U/L  Lipase, blood  Result Value Ref Range  Lipase 14 11 - 51 U/L  Type and screen [facility]  Result Value Ref Range   ABO/RH(D) A POS    Antibody Screen NEG    Sample Expiration 06/30/2016    Ct Head Wo Contrast  Result Date: 01/14/2018 CLINICAL DATA:  Altercation, requesting alcohol detox. EXAM: CT HEAD WITHOUT CONTRAST CT MAXILLOFACIAL WITHOUT CONTRAST TECHNIQUE: Multidetector CT imaging of the head and maxillofacial structures were performed using the standard protocol without intravenous contrast. Multiplanar CT image reconstructions of the maxillofacial structures were also generated. COMPARISON:  CT HEAD March 02, 2016 FINDINGS: CT HEAD FINDINGS BRAIN: No intraparenchymal hemorrhage, mass effect nor midline shift. The ventricles and sulci are normal. No acute large vascular territory infarcts. No abnormal extra-axial fluid collections. Basal cisterns are patent. VASCULAR: Unremarkable. SKULL/SOFT TISSUES: No skull fracture. Large LEFT scalp hematoma with radiopaque linear foreign bodies. Small RIGHT frontal scalp hematoma. OTHER: Soft tissue within LEFT external auditory canal most compatible with cerumen. CT MAXILLOFACIAL FINDINGS OSSEOUS: The mandible is intact, the condyles are located. No acute facial fracture. No  destructive bony lesions. Age indeterminate fractured tooth 8. ORBITS: Ocular globes and orbital contents are normal. SINUSES: Minimal paranasal sinus mucosal thickening without air-fluid levels. Nasal septum is midline. Included mastoid aircells are well aerated. SOFT TISSUES: LEFT temporal, periorbital and premalar soft tissue swelling with subcutaneous fat stranding. No subcutaneous gas or radiopaque foreign bodies. New IMPRESSION: CT HEAD: 1. No acute intracranial process. Large LEFT scalp hematoma with radiopaque foreign bodies. No skull fracture. 2. Otherwise negative noncontrast CT HEAD. CT MAXILLOFACIAL: 1. No acute facial fracture. 2. LEFT periorbital soft tissue swelling without postseptal hematoma. Electronically Signed   By: Awilda Metroourtnay  Bloomer M.D.   On: 01/14/2018 05:31   Ct Maxillofacial Wo Contrast  Result Date: 01/14/2018 CLINICAL DATA:  Altercation, requesting alcohol detox. EXAM: CT HEAD WITHOUT CONTRAST CT MAXILLOFACIAL WITHOUT CONTRAST TECHNIQUE: Multidetector CT imaging of the head and maxillofacial structures were performed using the standard protocol without intravenous contrast. Multiplanar CT image reconstructions of the maxillofacial structures were also generated. COMPARISON:  CT HEAD March 02, 2016 FINDINGS: CT HEAD FINDINGS BRAIN: No intraparenchymal hemorrhage, mass effect nor midline shift. The ventricles and sulci are normal. No acute large vascular territory infarcts. No abnormal extra-axial fluid collections. Basal cisterns are patent. VASCULAR: Unremarkable. SKULL/SOFT TISSUES: No skull fracture. Large LEFT scalp hematoma with radiopaque linear foreign bodies. Small RIGHT frontal scalp hematoma. OTHER: Soft tissue within LEFT external auditory canal most compatible with cerumen. CT MAXILLOFACIAL FINDINGS OSSEOUS: The mandible is intact, the condyles are located. No acute facial fracture. No destructive bony lesions. Age indeterminate fractured tooth 8. ORBITS: Ocular globes and  orbital contents are normal. SINUSES: Minimal paranasal sinus mucosal thickening without air-fluid levels. Nasal septum is midline. Included mastoid aircells are well aerated. SOFT TISSUES: LEFT temporal, periorbital and premalar soft tissue swelling with subcutaneous fat stranding. No subcutaneous gas or radiopaque foreign bodies. New IMPRESSION: CT HEAD: 1. No acute intracranial process. Large LEFT scalp hematoma with radiopaque foreign bodies. No skull fracture. 2. Otherwise negative noncontrast CT HEAD. CT MAXILLOFACIAL: 1. No acute facial fracture. 2. LEFT periorbital soft tissue swelling without postseptal hematoma. Electronically Signed   By: Awilda Metroourtnay  Bloomer M.D.   On: 01/14/2018 05:31

## 2018-01-14 NOTE — ED Notes (Signed)
Patient observed walking around waiting room. 

## 2018-10-19 IMAGING — DX DG WRIST COMPLETE 3+V*L*
5 series · 5 of 5 positions shown · non-contrast
Comparison: 03/02/2016 left hand radiographs

CLINICAL DATA: Lateral left wrist pain for 1 week. No known injury.

EXAM:
LEFT WRIST - COMPLETE 3+ VIEW

[wrist ap (1 of 3)]
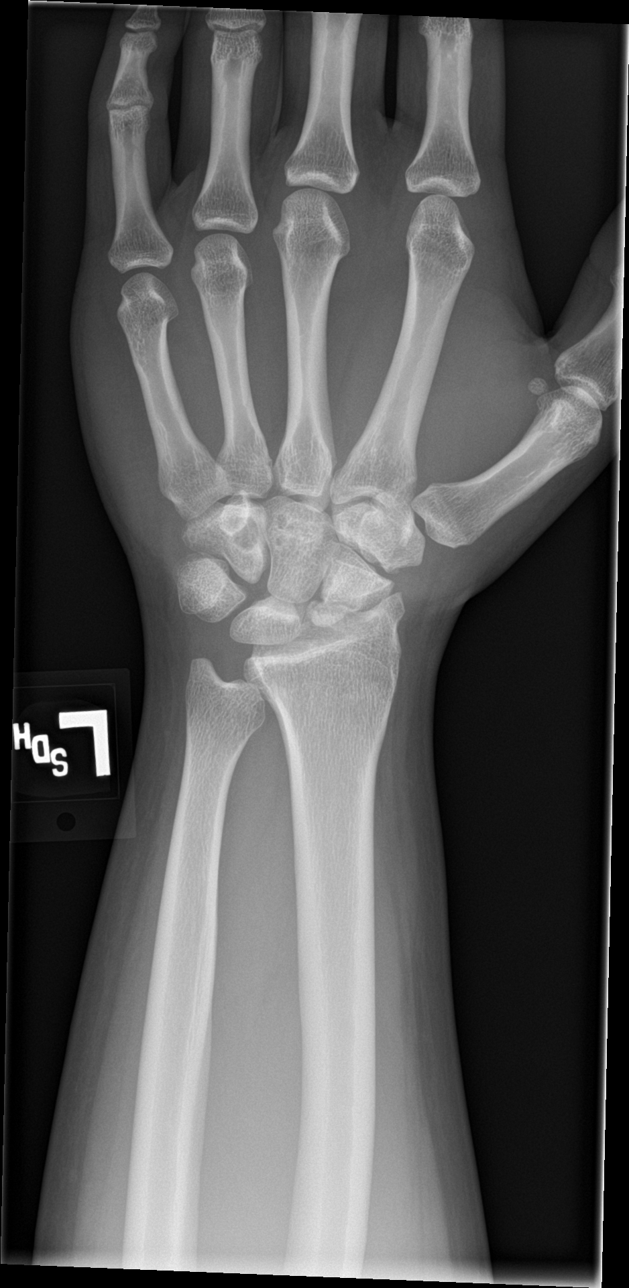

[wrist obl]
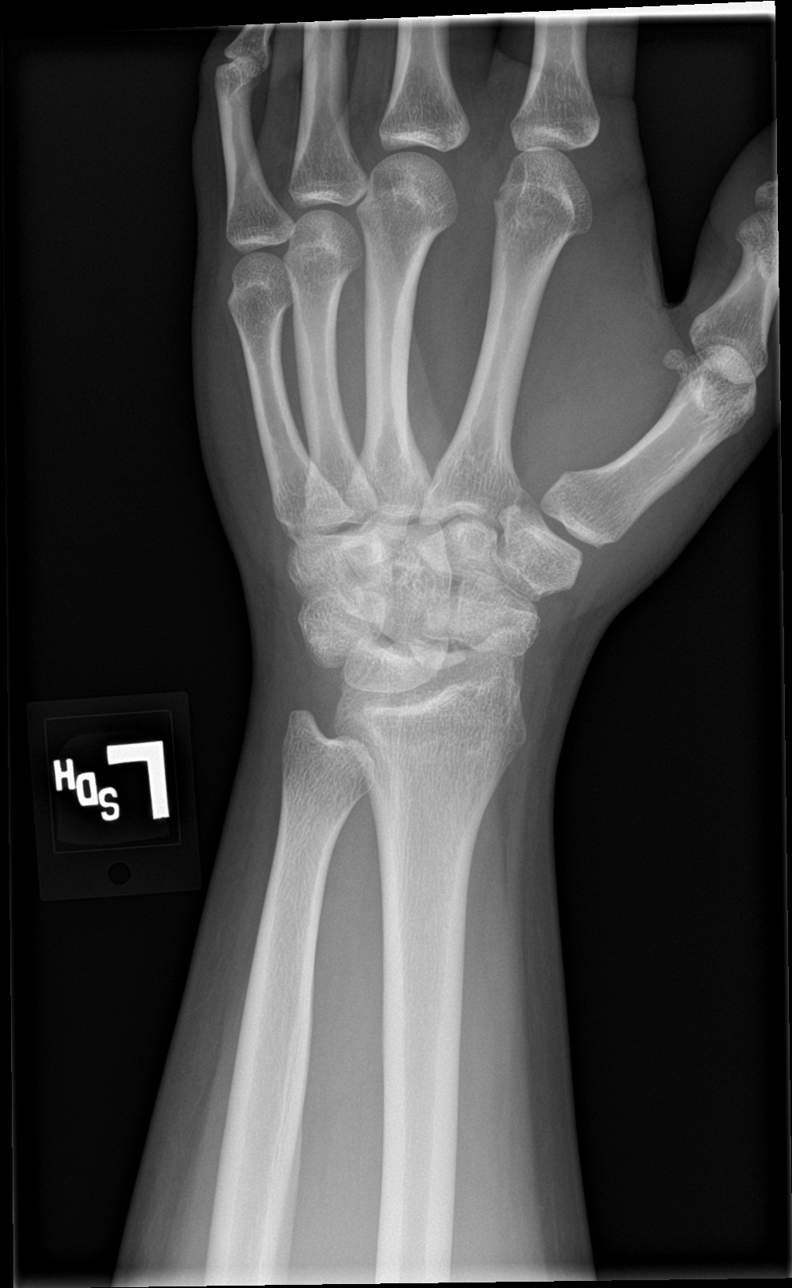

[wrist lat]
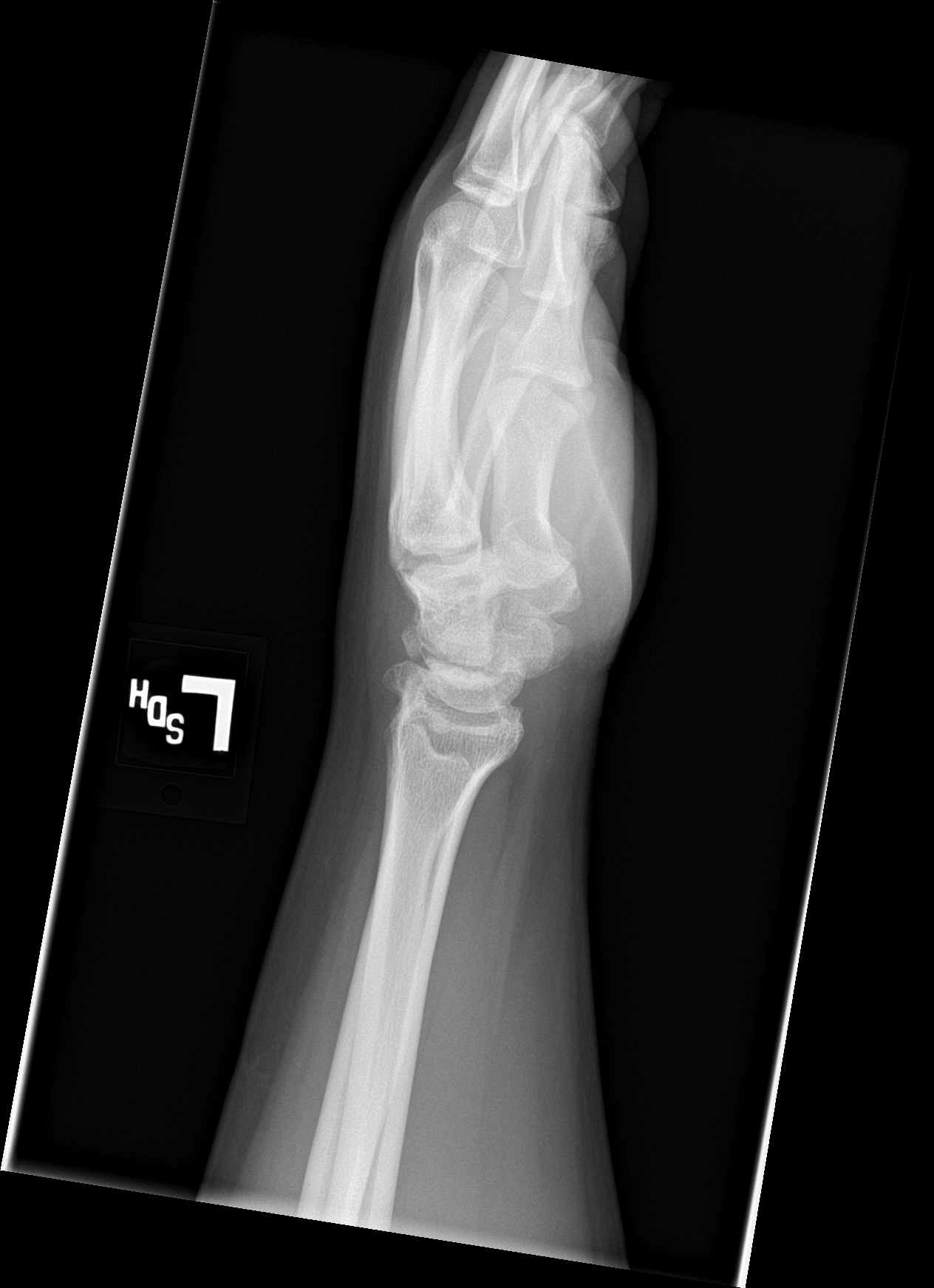

[wrist ap (2 of 3)]
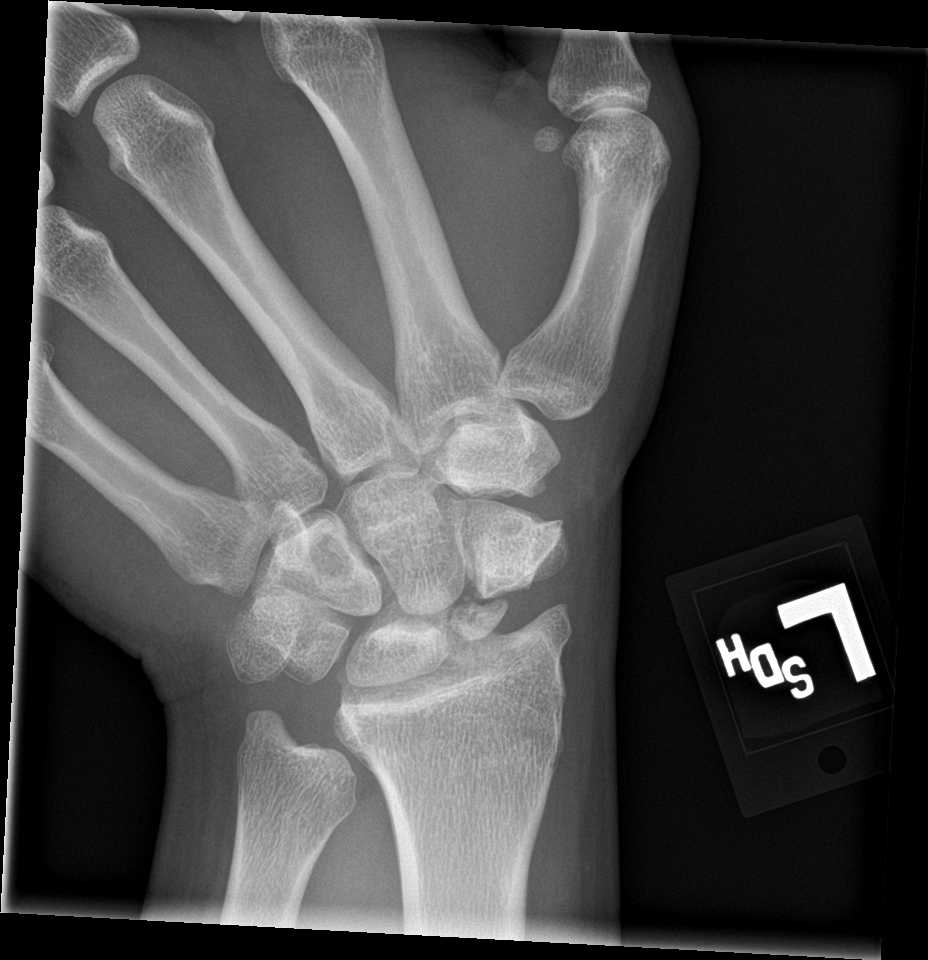

[wrist ap (3 of 3)]
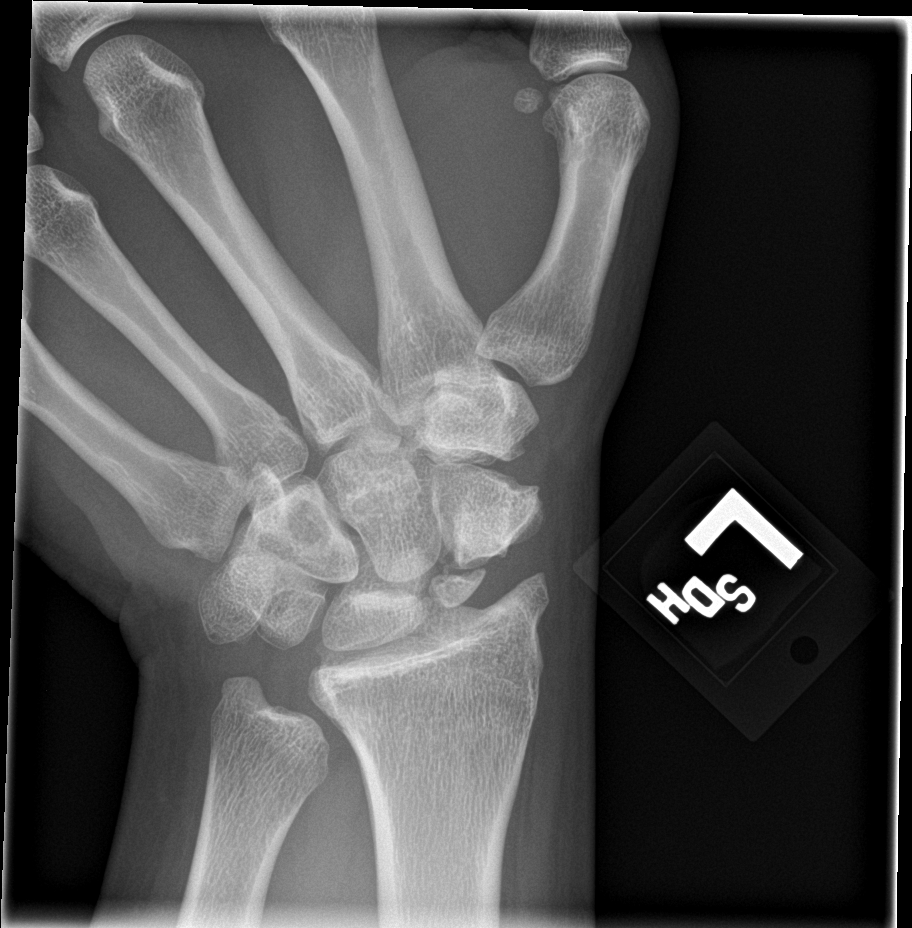

[5 of 5 positions shown; findings below may reference images not displayed]

FINDINGS: An old, nonunited scaphoid waist fracture is again noted with
osteonecrosis of the proximal pole. Mild deformity/ overgrowth of
the radial styloid is unchanged. No acute fracture or dislocation is
identified. There is negative ulnar variance. Soft tissues are
unremarkable.
IMPRESSION: Old nonunited scaphoid waist fracture with proximal pole
osteonecrosis. No acute abnormality identified.

## 2020-05-04 ENCOUNTER — Emergency Department: Payer: Medicaid Other

## 2020-05-04 ENCOUNTER — Other Ambulatory Visit: Payer: Self-pay

## 2020-05-04 ENCOUNTER — Encounter: Payer: Self-pay | Admitting: Emergency Medicine

## 2020-05-04 ENCOUNTER — Emergency Department
Admission: EM | Admit: 2020-05-04 | Discharge: 2020-05-04 | Disposition: A | Discharge status: Home / Self Care | Payer: Medicaid Other | Attending: Emergency Medicine | Admitting: Emergency Medicine

## 2020-05-04 DIAGNOSIS — F17220 Nicotine dependence, chewing tobacco, uncomplicated: Secondary | ICD-10-CM | POA: Insufficient documentation

## 2020-05-04 DIAGNOSIS — Y999 Unspecified external cause status: Secondary | ICD-10-CM | POA: Insufficient documentation

## 2020-05-04 DIAGNOSIS — Y929 Unspecified place or not applicable: Secondary | ICD-10-CM | POA: Diagnosis not present

## 2020-05-04 DIAGNOSIS — S82142A Displaced bicondylar fracture of left tibia, initial encounter for closed fracture: Secondary | ICD-10-CM | POA: Insufficient documentation

## 2020-05-04 DIAGNOSIS — W109XXA Fall (on) (from) unspecified stairs and steps, initial encounter: Secondary | ICD-10-CM | POA: Insufficient documentation

## 2020-05-04 DIAGNOSIS — Y939 Activity, unspecified: Secondary | ICD-10-CM | POA: Diagnosis not present

## 2020-05-04 DIAGNOSIS — R609 Edema, unspecified: Secondary | ICD-10-CM

## 2020-05-04 DIAGNOSIS — S8992XA Unspecified injury of left lower leg, initial encounter: Secondary | ICD-10-CM | POA: Diagnosis present

## 2020-05-04 MED ORDER — OXYCODONE-ACETAMINOPHEN 5-325 MG PO TABS
1.0000 | ORAL_TABLET | Freq: Once | ORAL | Status: AC
  Administered 2020-05-04: 1 via ORAL
  Filled 2020-05-04: qty 1

## 2020-05-04 MED ORDER — ONDANSETRON HCL 4 MG PO TABS: 4.0000 mg | ORAL_TABLET | Freq: Three times a day (TID) | ORAL | 0 refills | Status: AC | PRN

## 2020-05-04 MED ORDER — OXYCODONE-ACETAMINOPHEN 5-325 MG PO TABS: 1.0000 | ORAL_TABLET | Freq: Four times a day (QID) | ORAL | 0 refills | Status: AC | PRN

## 2020-05-04 MED ORDER — ONDANSETRON 4 MG PO TBDP
4.0000 mg | ORAL_TABLET | Freq: Once | ORAL | Status: AC
  Administered 2020-05-04: 4 mg via ORAL
  Filled 2020-05-04: qty 1

## 2020-05-04 NOTE — Discharge Instructions (Addendum)
Please make follow-up appointment with Dr. Allena Katz as soon as possible.  Please call the office and tell them that you have been diagnosed with a tibial plateau fracture and need an appointment because you will need surgery. You can pick up your pain medication from your pharmacy. You can take Percocet every 6 hours as needed for pain.  Please do not take any ibuprofen or anti-inflammatories until you see orthopedics in anticipation of surgery. Return to the ED with new or worsening symptoms.

## 2020-05-04 NOTE — ED Provider Notes (Signed)
Emergency Department Provider Note  ____________________________________________  Time seen: Approximately 4:01 PM  I have reviewed the triage vital signs and the nursing notes.   HISTORY  Chief Complaint Fall   Historian Patient     HPI Lawrence Griffith. is a 24 y.o. male presents to the emergency department with acute left knee pain.  Patient states that he was intoxicated last night and fell down approximately 10 steps.  Patient states that he has had pain and swelling since injury occurred.  Patient states that he is unsure why he did not come last night as he cannot recall many of the events that have been.  He denies neck pain or numbness or tingling in upper and lower extremities.  No chest pain, chest tightness or abdominal pain.  No abrasions or lacerations.  No other alleviating measures have been attempted.   Past Medical History:  Diagnosis Date  . Asthma      Immunizations up to date:  Yes.     Past Medical History:  Diagnosis Date  . Asthma     There are no problems to display for this patient.   History reviewed. No pertinent surgical history.  Prior to Admission medications   Medication Sig Start Date End Date Taking? Authorizing Provider  ibuprofen (ADVIL,MOTRIN) 800 MG tablet Take 1 tablet (800 mg total) by mouth every 8 (eight) hours as needed for moderate pain. 09/29/17   [name], MD  ondansetron (ZOFRAN) 4 MG tablet Take 1 tablet (4 mg total) by mouth every 8 (eight) hours as needed for up to 5 days for nausea or vomiting. 05/04/20 05/09/20  Orvil Feil, PA-C  oxyCODONE-acetaminophen (PERCOCET/ROXICET) 5-325 MG tablet Take 1 tablet by mouth every 6 (six) hours as needed for up to 3 days. 05/04/20 05/07/20  Orvil Feil, PA-C  predniSONE (DELTASONE) 10 MG tablet Take 6 tablets  today, on day 2 take 5 tablets, day 3 take 4 tablets, day 4 take 3 tablets, day 5 take  2 tablets and 1 tablet the last day 05/25/17   Tommi Rumps, PA-C     Allergies Penicillins  History reviewed. No pertinent family history.  Social History Social History   Tobacco Use  . Smoking status: Never Smoker  . Smokeless tobacco: Current User    Types: Chew  Substance Use Topics  . Alcohol use: Yes    Alcohol/week: 6.0 standard drinks    Types: 6 Cans of beer per week  . Drug use: Yes    Frequency: 7.0 times per week    Types: Marijuana     Review of Systems  Constitutional: No fever/chills Eyes:  No discharge ENT: No upper respiratory complaints. Respiratory: no cough. No SOB/ use of accessory muscles to breath Gastrointestinal:   No nausea, no vomiting.  No diarrhea.  No constipation. Musculoskeletal: Patient has left knee pain.  Skin: Negative for rash, abrasions, lacerations, ecchymosis.    ____________________________________________   PHYSICAL EXAM:  VITAL SIGNS: ED Triage Vitals  Enc Vitals Group     BP 05/04/20 1447 (!) 142/97     Pulse Rate 05/04/20 1446 (!) 105     Resp 05/04/20 1446 18     Temp 05/04/20 1446 98.7 F (37.1 C)     Temp Source 05/04/20 1446 Oral     SpO2 05/04/20 1446 100 %     Weight 05/04/20 1447 180 lb (81.6 kg)     Height 05/04/20 1447 5\' 7"  (1.702 m)  Head Circumference --      Peak Flow --      Pain Score 05/04/20 1447 10     Pain Loc --      Pain Edu? --      Excl. in [place]? --      Constitutional: Alert and oriented. Well appearing and in no acute distress. Eyes: Conjunctivae are normal. PERRL. EOMI. Head: Atraumatic. ENT:      Ears: TMs are pearly.       Nose: No congestion/rhinnorhea.      Mouth/Throat: Mucous membranes are moist.  Neck: No stridor.  No cervical spine tenderness to palpation.  Cardiovascular: Normal rate, regular rhythm. Normal S1 and S2.  Good peripheral circulation. Respiratory: Normal respiratory effort without tachypnea or retractions. Lungs CTAB. Good air entry to the bases with no decreased or absent breath sounds Gastrointestinal: Bowel sounds  x 4 quadrants. Soft and nontender to palpation. No guarding or rigidity. No distention. Musculoskeletal: Patient is unable to perform full range of motion of the left knee.  Compartments along the left upper and left lower extremity are soft to palpation.  Left knee appears effused.  Patient is able to move all 5 left toes.  Palpable dorsalis pedis pulse bilaterally and symmetrically.  Capillary refill less than 2 seconds on the left. Neurologic:  Normal for age. No gross focal neurologic deficits are appreciated.  Skin:  Skin is warm, dry and intact. No rash noted. Psychiatric: Mood and affect are normal for age. Speech and behavior are normal.   ____________________________________________   LABS (all labs ordered are listed, but only abnormal results are displayed)  Labs Reviewed - No data to display ____________________________________________  EKG   ____________________________________________  RADIOLOGY Unk Pinto, personally viewed and evaluated these images (plain radiographs) as part of my medical decision making, as well as reviewing the written report by the radiologist.  CT Head Wo Contrast  Result Date: 05/04/2020 CLINICAL DATA:  Intoxicated, fell down 10 stairs, nausea, headache EXAM: CT HEAD WITHOUT CONTRAST CT CERVICAL SPINE WITHOUT CONTRAST TECHNIQUE: Multidetector CT imaging of the head and cervical spine was performed following the standard protocol without intravenous contrast. Multiplanar CT image reconstructions of the cervical spine were also generated. COMPARISON:  06/21/2015 FINDINGS: CT HEAD FINDINGS Brain: No acute infarct or hemorrhage. Lateral ventricles and midline structures are unremarkable. No acute extra-axial fluid collections. No mass effect. Vascular: No hyperdense vessel or unexpected calcification. Skull: Normal. Negative for fracture or focal lesion. Sinuses/Orbits: No acute finding. Other: None. CT CERVICAL SPINE FINDINGS Alignment: Alignment is  grossly anatomic. Skull base and vertebrae: No acute displaced fractures. Soft tissues and spinal canal: No prevertebral fluid or swelling. No visible canal hematoma. Disc levels: Mild spondylosis at C3/C4 results in right-sided neural foraminal encroachment. Upper chest: Airway is patent. Lung apices are clear. Other: Reconstructed images demonstrate no additional findings. IMPRESSION: 1. Unremarkable head CT. 2. No acute cervical spine fracture. 3. Mild spondylosis at C3/C4 results in right-sided neural foraminal encroachment. Electronically Signed   By: Randa Ngo M.D.   On: 05/04/2020 15:48   CT Cervical Spine Wo Contrast  Result Date: 05/04/2020 CLINICAL DATA:  Intoxicated, fell down 10 stairs, nausea, headache EXAM: CT HEAD WITHOUT CONTRAST CT CERVICAL SPINE WITHOUT CONTRAST TECHNIQUE: Multidetector CT imaging of the head and cervical spine was performed following the standard protocol without intravenous contrast. Multiplanar CT image reconstructions of the cervical spine were also generated. COMPARISON:  06/21/2015 FINDINGS: CT HEAD FINDINGS Brain: No acute infarct  or hemorrhage. Lateral ventricles and midline structures are unremarkable. No acute extra-axial fluid collections. No mass effect. Vascular: No hyperdense vessel or unexpected calcification. Skull: Normal. Negative for fracture or focal lesion. Sinuses/Orbits: No acute finding. Other: None. CT CERVICAL SPINE FINDINGS Alignment: Alignment is grossly anatomic. Skull base and vertebrae: No acute displaced fractures. Soft tissues and spinal canal: No prevertebral fluid or swelling. No visible canal hematoma. Disc levels: Mild spondylosis at C3/C4 results in right-sided neural foraminal encroachment. Upper chest: Airway is patent. Lung apices are clear. Other: Reconstructed images demonstrate no additional findings. IMPRESSION: 1. Unremarkable head CT. 2. No acute cervical spine fracture. 3. Mild spondylosis at C3/C4 results in right-sided  neural foraminal encroachment. Electronically Signed   By: Sharlet Salina M.D.   On: 05/04/2020 15:48   DG Knee Complete 4 Views Left  Result Date: 05/04/2020 CLINICAL DATA:  Recent fall down steps with knee pain, initial encounter EXAM: LEFT KNEE - COMPLETE 4+ VIEW COMPARISON:  None. FINDINGS: Large joint effusion is noted. Degenerative changes are noted in the medial and lateral joint space. There is a mildly displaced lateral tibial plateau fracture identified. IMPRESSION: Lateral tibial plateau fracture with mild depression and large joint effusion. Electronically Signed   By: Alcide Clever M.D.   On: 05/04/2020 15:36    ____________________________________________    PROCEDURES  Procedure(s) performed:     Procedures     Medications  oxyCODONE-acetaminophen (PERCOCET/ROXICET) 5-325 MG per tablet 1 tablet (1 tablet Oral Given 05/04/20 1623)  ondansetron (ZOFRAN-ODT) disintegrating tablet 4 mg (4 mg Oral Given 05/04/20 1622)     ____________________________________________   INITIAL IMPRESSION / ASSESSMENT AND PLAN / ED COURSE  Pertinent labs & imaging results that were available during my care of the patient were reviewed by me and considered in my medical decision making (see chart for details).      Assessment and plan Left knee pain Tibial Plateau fracture 24 year old male presents to the emergency department with acute left knee pain after patient had a mechanical fall.  Patient was mildly hypertensive and tachycardic at triage but vital signs were otherwise reassuring.  Left knee appeared grossly effused with soft compartments along the left upper and left lower extremity.  Patient had a palpable dorsalis pedis pulse and capillary refill was less than 2 seconds on the left.  I consulted Dr. Allena Katz regarding patient's case.  Very much appreciate consult.  After reviewing patient's x-rays, Dr. Allena Katz recommended knee immobilization and follow-up with him in the  office.  I had a conversation with my attending physician, Dr. Larinda Buttery regarding patient's case and anticipated plan for discharge.  Patient was given Percocet in the emergency department for pain.  He was discharged with Percocet.  Return precautions were given to return with worsening pain or changes in sensation in the left lower extremity.  All patient questions were answered.    ____________________________________________  FINAL CLINICAL IMPRESSION(S) / ED DIAGNOSES  Final diagnoses:  Swelling  Closed fracture of left tibial plateau, initial encounter      NEW MEDICATIONS STARTED DURING THIS VISIT:  ED Discharge Orders         Ordered    oxyCODONE-acetaminophen (PERCOCET/ROXICET) 5-325 MG tablet  Every 6 hours PRN     05/04/20 1637    ondansetron (ZOFRAN) 4 MG tablet  Every 8 hours PRN     05/04/20 1637              This chart was dictated using voice recognition software/Dragon. Despite  best efforts to proofread, errors can occur which can change the meaning. Any change was purely unintentional.     Orvil Feil, PA-C 05/04/20 1641    Chesley Noon, MD 05/05/20 [phone]

## 2020-05-04 NOTE — ED Triage Notes (Signed)
Pt was drunk last night and fell down 10 concrete steps.  Swelling to left knee with severe pain; unable to ambulate on it.  Cannot flex knee on left.  When asked pt does not remember events after fall, unsure LOC.  + nausea.  No neck pain.

## 2020-05-04 NOTE — ED Notes (Signed)
Pt placed in knee immobilizer. Tolerated well. Pt given crutches and instructed on use.

## 2020-05-06 ENCOUNTER — Other Ambulatory Visit: Payer: Self-pay

## 2020-05-06 ENCOUNTER — Ambulatory Visit
Admission: RE | Admit: 2020-05-06 | Discharge: 2020-05-06 | Disposition: A | Discharge status: Home / Self Care | Payer: Medicaid Other | Source: Ambulatory Visit | Attending: Surgery | Admitting: Surgery

## 2020-05-06 ENCOUNTER — Other Ambulatory Visit: Payer: Self-pay | Admitting: Surgery

## 2020-05-06 DIAGNOSIS — S82122A Displaced fracture of lateral condyle of left tibia, initial encounter for closed fracture: Secondary | ICD-10-CM | POA: Diagnosis present

## 2020-05-08 ENCOUNTER — Other Ambulatory Visit: Payer: Self-pay | Admitting: Orthopedic Surgery

## 2020-05-09 ENCOUNTER — Other Ambulatory Visit
Admission: RE | Admit: 2020-05-09 | Discharge: 2020-05-09 | Disposition: A | Discharge status: Home / Self Care | Payer: Medicaid Other | Source: Ambulatory Visit | Attending: Orthopedic Surgery | Admitting: Orthopedic Surgery

## 2020-05-09 ENCOUNTER — Other Ambulatory Visit: Payer: Self-pay

## 2020-05-09 DIAGNOSIS — Z20822 Contact with and (suspected) exposure to covid-19: Secondary | ICD-10-CM | POA: Diagnosis not present

## 2020-05-09 DIAGNOSIS — Z01812 Encounter for preprocedural laboratory examination: Secondary | ICD-10-CM | POA: Insufficient documentation

## 2020-05-10 LAB — SARS CORONAVIRUS 2 (TAT 6-24 HRS): SARS Coronavirus 2: NEGATIVE

## 2020-05-13 ENCOUNTER — Ambulatory Visit
Admission: RE | Admit: 2020-05-13 | Discharge: 2020-05-13 | Disposition: A | Discharge status: Home / Self Care | Payer: Medicaid Other | Attending: Orthopedic Surgery | Admitting: Orthopedic Surgery

## 2020-05-13 ENCOUNTER — Other Ambulatory Visit: Payer: Self-pay

## 2020-05-13 ENCOUNTER — Ambulatory Visit: Payer: Medicaid Other | Admitting: Anesthesiology

## 2020-05-13 ENCOUNTER — Encounter
Admission: RE | Disposition: A | Discharge status: Home / Self Care | Payer: Self-pay | Source: Home / Self Care | Attending: Orthopedic Surgery

## 2020-05-13 ENCOUNTER — Encounter: Payer: Self-pay | Admitting: Orthopedic Surgery

## 2020-05-13 ENCOUNTER — Ambulatory Visit: Payer: Medicaid Other

## 2020-05-13 DIAGNOSIS — W109XXA Fall (on) (from) unspecified stairs and steps, initial encounter: Secondary | ICD-10-CM | POA: Diagnosis not present

## 2020-05-13 DIAGNOSIS — Z880 Allergy status to penicillin: Secondary | ICD-10-CM | POA: Diagnosis not present

## 2020-05-13 DIAGNOSIS — Z79899 Other long term (current) drug therapy: Secondary | ICD-10-CM | POA: Insufficient documentation

## 2020-05-13 DIAGNOSIS — S82142A Displaced bicondylar fracture of left tibia, initial encounter for closed fracture: Secondary | ICD-10-CM | POA: Insufficient documentation

## 2020-05-13 DIAGNOSIS — Y939 Activity, unspecified: Secondary | ICD-10-CM | POA: Diagnosis not present

## 2020-05-13 DIAGNOSIS — Z419 Encounter for procedure for purposes other than remedying health state, unspecified: Secondary | ICD-10-CM

## 2020-05-13 HISTORY — PX: ORIF TIBIA PLATEAU: SHX2132

## 2020-05-13 LAB — URINE DRUG SCREEN, QUALITATIVE (ARMC ONLY)
Amphetamines, Ur Screen: NOT DETECTED
Barbiturates, Ur Screen: NOT DETECTED
Benzodiazepine, Ur Scrn: NOT DETECTED
Cannabinoid 50 Ng, Ur ~~LOC~~: POSITIVE — AB
Cocaine Metabolite,Ur ~~LOC~~: NOT DETECTED
MDMA (Ecstasy)Ur Screen: NOT DETECTED
Methadone Scn, Ur: NOT DETECTED
Opiate, Ur Screen: NOT DETECTED
Phencyclidine (PCP) Ur S: NOT DETECTED
Tricyclic, Ur Screen: NOT DETECTED

## 2020-05-13 SURGERY — OPEN REDUCTION INTERNAL FIXATION (ORIF) TIBIAL PLATEAU
Anesthesia: General | Site: Knee | Laterality: Left

## 2020-05-13 MED ORDER — LIDOCAINE HCL (CARDIAC) PF 100 MG/5ML IV SOSY
PREFILLED_SYRINGE | INTRAVENOUS | Status: DC | PRN
  Administered 2020-05-13: 100 mg via INTRAVENOUS

## 2020-05-13 MED ORDER — FENTANYL CITRATE (PF) 100 MCG/2ML IJ SOLN
25.0000 ug | INTRAMUSCULAR | Status: AC | PRN
  Administered 2020-05-13 (×8): 25 ug via INTRAVENOUS

## 2020-05-13 MED ORDER — CHLORHEXIDINE GLUCONATE 0.12 % MT SOLN
OROMUCOSAL | Status: AC
  Filled 2020-05-13: qty 15

## 2020-05-13 MED ORDER — ORAL CARE MOUTH RINSE: 15.0000 mL | Freq: Once | OROMUCOSAL | Status: AC

## 2020-05-13 MED ORDER — ONDANSETRON HCL 4 MG/2ML IJ SOLN
INTRAMUSCULAR | Status: AC
  Filled 2020-05-13: qty 2

## 2020-05-13 MED ORDER — FAMOTIDINE 20 MG PO TABS
ORAL_TABLET | ORAL | Status: AC
  Filled 2020-05-13: qty 1

## 2020-05-13 MED ORDER — DEXMEDETOMIDINE HCL IN NACL 400 MCG/100ML IV SOLN
INTRAVENOUS | Status: DC | PRN
  Administered 2020-05-13: 8 ug via INTRAVENOUS
  Administered 2020-05-13: 12 ug via INTRAVENOUS

## 2020-05-13 MED ORDER — ONDANSETRON 4 MG PO TBDP
4.0000 mg | ORAL_TABLET | Freq: Three times a day (TID) | ORAL | 0 refills | Status: DC | PRN
Start: 2020-05-13 — End: 2023-11-18

## 2020-05-13 MED ORDER — NEOMYCIN-POLYMYXIN B GU 40-200000 IR SOLN
Status: DC | PRN
  Administered 2020-05-13: 4 mL

## 2020-05-13 MED ORDER — HYDROMORPHONE HCL 1 MG/ML IJ SOLN
INTRAMUSCULAR | Status: AC
  Administered 2020-05-13 (×2): 0.25 mg
  Filled 2020-05-13: qty 1

## 2020-05-13 MED ORDER — ACETAMINOPHEN 10 MG/ML IV SOLN
INTRAVENOUS | Status: AC
  Filled 2020-05-13: qty 100

## 2020-05-13 MED ORDER — PROPOFOL 10 MG/ML IV BOLUS
INTRAVENOUS | Status: AC
  Filled 2020-05-13: qty 20

## 2020-05-13 MED ORDER — FAMOTIDINE 20 MG PO TABS
20.0000 mg | ORAL_TABLET | Freq: Once | ORAL | Status: AC
  Administered 2020-05-13: 20 mg via ORAL

## 2020-05-13 MED ORDER — FENTANYL CITRATE (PF) 100 MCG/2ML IJ SOLN
INTRAMUSCULAR | Status: AC
  Filled 2020-05-13: qty 2

## 2020-05-13 MED ORDER — OXYCODONE HCL 5 MG PO TABS
ORAL_TABLET | ORAL | Status: AC
  Filled 2020-05-13: qty 1

## 2020-05-13 MED ORDER — ONDANSETRON HCL 4 MG/2ML IJ SOLN
INTRAMUSCULAR | Status: DC | PRN
  Administered 2020-05-13: 4 mg via INTRAVENOUS

## 2020-05-13 MED ORDER — PROPOFOL 10 MG/ML IV BOLUS
INTRAVENOUS | Status: DC | PRN
  Administered 2020-05-13: 200 mg via INTRAVENOUS

## 2020-05-13 MED ORDER — ACETAMINOPHEN 10 MG/ML IV SOLN
INTRAVENOUS | Status: DC | PRN
  Administered 2020-05-13: 1000 mg via INTRAVENOUS

## 2020-05-13 MED ORDER — LIDOCAINE-EPINEPHRINE 1 %-1:100000 IJ SOLN
INTRAMUSCULAR | Status: AC
  Filled 2020-05-13: qty 1

## 2020-05-13 MED ORDER — ASPIRIN EC 325 MG PO TBEC
325.0000 mg | DELAYED_RELEASE_TABLET | Freq: Every day | ORAL | 0 refills | Status: AC
Start: 2020-05-13 — End: 2020-06-10

## 2020-05-13 MED ORDER — MIDAZOLAM HCL 2 MG/2ML IJ SOLN
INTRAMUSCULAR | Status: AC
  Filled 2020-05-13: qty 2

## 2020-05-13 MED ORDER — CHLORHEXIDINE GLUCONATE 0.12 % MT SOLN
15.0000 mL | Freq: Once | OROMUCOSAL | Status: AC
  Administered 2020-05-13: 15 mL via OROMUCOSAL

## 2020-05-13 MED ORDER — ACETAMINOPHEN 500 MG PO TABS: 1000.0000 mg | ORAL_TABLET | Freq: Three times a day (TID) | ORAL | 2 refills | Status: AC

## 2020-05-13 MED ORDER — ONDANSETRON HCL 4 MG/2ML IJ SOLN: 4.0000 mg | Freq: Once | INTRAMUSCULAR | Status: DC | PRN

## 2020-05-13 MED ORDER — NEOMYCIN-POLYMYXIN B GU 40-200000 IR SOLN
Status: AC
  Filled 2020-05-13: qty 4

## 2020-05-13 MED ORDER — HYDROMORPHONE HCL 1 MG/ML IJ SOLN
0.2500 mg | INTRAMUSCULAR | Status: AC | PRN
  Administered 2020-05-13 (×4): 0.25 mg via INTRAVENOUS

## 2020-05-13 MED ORDER — CLINDAMYCIN PHOSPHATE 900 MG/50ML IV SOLN
900.0000 mg | INTRAVENOUS | Status: AC
  Administered 2020-05-13: 900 mg via INTRAVENOUS

## 2020-05-13 MED ORDER — BUPIVACAINE HCL (PF) 0.5 % IJ SOLN
INTRAMUSCULAR | Status: AC
  Filled 2020-05-13: qty 30

## 2020-05-13 MED ORDER — OXYCODONE HCL 5 MG PO TABS
5.0000 mg | ORAL_TABLET | Freq: Once | ORAL | Status: AC
  Administered 2020-05-13: 5 mg via ORAL

## 2020-05-13 MED ORDER — CLINDAMYCIN PHOSPHATE 900 MG/50ML IV SOLN
INTRAVENOUS | Status: AC
  Filled 2020-05-13: qty 50

## 2020-05-13 MED ORDER — MIDAZOLAM HCL 2 MG/2ML IJ SOLN
INTRAMUSCULAR | Status: DC | PRN
  Administered 2020-05-13: 2 mg via INTRAVENOUS

## 2020-05-13 MED ORDER — SEVOFLURANE IN SOLN
RESPIRATORY_TRACT | Status: AC
  Filled 2020-05-13: qty 250

## 2020-05-13 MED ORDER — LIDOCAINE-EPINEPHRINE (PF) 1 %-1:200000 IJ SOLN
INTRAMUSCULAR | Status: DC | PRN
  Administered 2020-05-13: 19 mL via INTRAMUSCULAR

## 2020-05-13 MED ORDER — OXYCODONE HCL 5 MG PO TABS: 5.0000 mg | ORAL_TABLET | ORAL | 0 refills | Status: AC | PRN

## 2020-05-13 MED ORDER — LACTATED RINGERS IV SOLN: INTRAVENOUS | Status: DC

## 2020-05-13 MED ORDER — LACTATED RINGERS IV SOLN: INTRAVENOUS | Status: DC | PRN

## 2020-05-13 MED ORDER — FENTANYL CITRATE (PF) 100 MCG/2ML IJ SOLN
INTRAMUSCULAR | Status: DC | PRN
  Administered 2020-05-13 (×2): 25 ug via INTRAVENOUS
  Administered 2020-05-13: 50 ug via INTRAVENOUS
  Administered 2020-05-13: 100 ug via INTRAVENOUS

## 2020-05-13 SURGICAL SUPPLY — 53 items
BIT DRILL 4.9 CANNULATED (BIT) ×1
BIT DRILL CANN QC 4.9 LRG (BIT) IMPLANT
BNDG ELASTIC 6X5.8 VLCR STR LF (GAUZE/BANDAGES/DRESSINGS) ×3 IMPLANT
BRACE KNEE POST OP SHORT (BRACE) ×2 IMPLANT
CANISTER SUCT 1200ML W/VALVE (MISCELLANEOUS) ×3 IMPLANT
CHLORAPREP W/TINT 26 (MISCELLANEOUS) ×3 IMPLANT
COOLER POLAR GLACIER W/PUMP (MISCELLANEOUS) ×3 IMPLANT
COVER WAND RF STERILE (DRAPES) ×3 IMPLANT
CUFF TOURN SGL QUICK 24 (TOURNIQUET CUFF)
CUFF TOURN SGL QUICK 30 (TOURNIQUET CUFF) ×2
CUFF TRNQT CYL 24X4X16.5-23 (TOURNIQUET CUFF) IMPLANT
CUFF TRNQT CYL 30X4X21-28X (TOURNIQUET CUFF) IMPLANT
DRAPE 3/4 80X56 (DRAPES) ×3 IMPLANT
DRAPE C-ARM XRAY 36X54 (DRAPES) ×3 IMPLANT
DRAPE C-ARMOR (DRAPES) ×3 IMPLANT
DRILL BIT CANNULATED 4.9 (BIT) ×2
ELECT CAUTERY BLADE 6.4 (BLADE) ×3 IMPLANT
ELECT REM PT RETURN 9FT ADLT (ELECTROSURGICAL) ×3
ELECTRODE REM PT RTRN 9FT ADLT (ELECTROSURGICAL) ×1 IMPLANT
GAUZE SPONGE 4X4 12PLY STRL (GAUZE/BANDAGES/DRESSINGS) ×3 IMPLANT
GAUZE XEROFORM 1X8 LF (GAUZE/BANDAGES/DRESSINGS) ×3 IMPLANT
GLOVE BIOGEL PI IND STRL 8 (GLOVE) ×1 IMPLANT
GLOVE BIOGEL PI INDICATOR 8 (GLOVE) ×2
GLOVE SURG SYN 7.5  E (GLOVE) ×4
GLOVE SURG SYN 7.5 E (GLOVE) ×2 IMPLANT
GLOVE SURG SYN 7.5 PF PI (GLOVE) ×2 IMPLANT
GOWN STRL REUS W/ TWL LRG LVL3 (GOWN DISPOSABLE) ×1 IMPLANT
GOWN STRL REUS W/ TWL XL LVL3 (GOWN DISPOSABLE) ×1 IMPLANT
GOWN STRL REUS W/TWL LRG LVL3 (GOWN DISPOSABLE) ×2
GOWN STRL REUS W/TWL XL LVL3 (GOWN DISPOSABLE) ×2
GUIDEWIRE THRD ASNIS 3.2X300 (WIRE) ×4 IMPLANT
KIT TURNOVER KIT A (KITS) ×3 IMPLANT
LABEL OR SOLS (LABEL) ×3 IMPLANT
MAT ABSORB  FLUID 56X50 GRAY (MISCELLANEOUS) ×2
MAT ABSORB FLUID 56X50 GRAY (MISCELLANEOUS) ×1 IMPLANT
NDL FILTER BLUNT 18X1 1/2 (NEEDLE) ×1 IMPLANT
NDL HPO THNWL 1X22GA REG BVL (NEEDLE) IMPLANT
NEEDLE FILTER BLUNT 18X 1/2SAF (NEEDLE) ×2
NEEDLE FILTER BLUNT 18X1 1/2 (NEEDLE) ×1 IMPLANT
NEEDLE SAFETY 22GX1 (NEEDLE) ×2
NS IRRIG 1000ML POUR BTL (IV SOLUTION) ×3 IMPLANT
PACK TOTAL KNEE (MISCELLANEOUS) ×3 IMPLANT
PAD WRAPON POLAR KNEE (MISCELLANEOUS) ×1 IMPLANT
SCREW ASNIS 65MM 326065S (Screw) ×4 IMPLANT
STAPLER SKIN PROX 35W (STAPLE) ×3 IMPLANT
SUT VIC AB 0 CT1 36 (SUTURE) ×6 IMPLANT
SUT VIC AB 2-0 CT1 27 (SUTURE) ×4
SUT VIC AB 2-0 CT1 TAPERPNT 27 (SUTURE) ×2 IMPLANT
SYR 10ML LL (SYRINGE) ×2 IMPLANT
SYR 5ML LL (SYRINGE) ×3 IMPLANT
TRAY FOLEY MTR SLVR 16FR STAT (SET/KITS/TRAYS/PACK) IMPLANT
WASHER SCREW MATTA SS 13.0X1.5 (Washer) ×4 IMPLANT
WRAPON POLAR PAD KNEE (MISCELLANEOUS) ×3

## 2020-05-13 NOTE — Op Note (Signed)
Operative Note    SURGERY DATE: 05/13/2020   PRE-OP DIAGNOSIS:  1. Left tibial plateau fracture   POST-OP DIAGNOSIS:  1. Left tibial plateau fracture   PROCEDURES:  1. ORIF left tibial plateau    SURGEON: Rosealee Albee, MD   ANESTHESIA: Gen   ESTIMATED BLOOD LOSS: 5cc   TOTAL IV FLUIDS: see anesthesia record  IMPLANTS: Stryker 6.5 x 11mm partially-threaded cannulated screw x 2 with washers   INDICATION(S):  Lawrence Griffith. is a 24 y.o. male who sustained a fall down stairs approximately 1 week ago.  Radiographs and CT scan demonstrated a split lateral tibial plateau without significant joint depression. We discussed both operative and non-operative management of this fracture.  After discussion of risks, benefits, and alternatives to surgery, the patient elected to proceed with the above procedure.   OPERATIVE FINDINGS: lateral tibial plateau fracture with split with minimal joint depression   OPERATIVE REPORT:   I identified Ryoma A Paulette Lynch. in the pre-operative holding area. Informed consent was obtained and the surgical site was marked. I reviewed the risks and benefits of the proposed surgical intervention and the patient wished to proceed. The patient was transferred to the operative suite and anesthesia was administered. The patient was transferred to the operating room table and placed in a supine position. All down side pressure points were appropriately padded. Appropriate IV antibiotics were administered within 30 minutes before incision. The extremity was then prepped and draped in standard fashion. A time out was performed confirming the correct extremity, correct patient, and correct procedure.   Using fluoroscopic guidance, 2 stab incisions were made on either side of the tibial plateau.  Hemostat was used to dissect through the soft tissue down to bone.  A periarticular reduction clamp was used to the lateral split fracture.  This allowed for excellent  reduction with minimal residual widening of the tibial plateau.  Two additional stab incisions were made along the lateral aspect of the proximal tibial plateau, one anteriorly and one posteriorly.  Guidewires were placed across the fracture.  Appropriate position was confirmed with fluoroscopy.  The near cortex was overdrilled.  Partially-threaded cannulated screws with washers were then placed and sequentially tightened to achieve appropriate compression.  Guidewires were removed.  Fluoroscopy was utilized to confirm appropriate reduction and fixation of the lateral tibial plateau split fracture in both AP and lateral planes as well as the oblique views.  The knee was stable to varus and valgus stress.  The stab incisions were irrigated and local anesthetic was injected.  2-0 Vicryl was used to close the subdermal layer of the screw insertion incisions.  Staples were used to close the skin on all of the incisions. Xeroform and sterile dressing was applied.  A Polar Care and a hinged knee brace were applied.  The patient was awakened and transferred to a stretcher bed and to the post anesthesia care unit in stable condition.   POSTOPERATIVE PLAN: Patient will be discharged home.  ASA 325 mg x 4 weeks for DVT prophylaxis.  Nonweightbearing x at least 6 weeks.  Physical therapy within the first week for early knee range of motion.

## 2020-05-13 NOTE — H&P (Signed)
Paper H&P (by Horris Latino, PA) to be scanned into permanent record. H&P reviewed. No significant changes noted.

## 2020-05-13 NOTE — Progress Notes (Signed)
Pt pulling pillows out, will not leave leg elevated

## 2020-05-13 NOTE — Anesthesia Postprocedure Evaluation (Signed)
Anesthesia Post Note  Patient: Lawrence Griffith.  Procedure(s) Performed: OPEN REDUCTION INTERNAL FIXATION (ORIF) OF LEFT LATERAL TIBIAL PLATEAU FRACTURE (Left Knee)  Patient location during evaluation: PACU Anesthesia Type: General Level of consciousness: awake and alert Pain management: pain level controlled Vital Signs Assessment: post-procedure vital signs reviewed and stable Respiratory status: spontaneous breathing and respiratory function stable Cardiovascular status: stable Anesthetic complications: no     Last Vitals:  Vitals:   05/13/20 1530 05/13/20 1535  BP: (!) 136/94   Pulse: 93 92  Resp: 14 16  Temp:    SpO2: 96% 94%    Last Pain:  Vitals:   05/13/20 1535  TempSrc:   PainSc: 10-Worst pain ever                 Reace Breshears K

## 2020-05-13 NOTE — Discharge Instructions (Addendum)
Post-Op Instructions  1. Bracing or crutches: You will be provided with a long brace (from hip to ankle) and crutches at the surgery center.   2. Ice: You will be provided with a device [facility]) that allows you to ice the affected area effectively.   3. Showering: Incisions must remain dry for 5 days. Afterwards, you may shower and gently pat incision dry. NO submerging wound for 4 weeks. Staples will be removed at your first post-operative appointment in 2 weeks.   4. Driving: You will be given specific driving precautions at discharge. Plan on not driving for at least one week for left knee surgery, and 4 weeks for right knee surgery if you are restricted due to the brace and knee motion. Please note that you are advised NOT to drive while taking narcotic pain medications as you may be impaired and unsafe to drive.  5. Activity: Weight bearing: No weight bearing on the affected leg for 6-10 weeks. Bending the knee is limited and will be guided by the physical therapist. Elevate knee above heart level as much as possible for one week. Avoid standing more than 5 minutes (consecutively) for the first week. No exercise involving the knee until cleared by the surgeon or physical therapist.  Avoid long distance travel for 4 weeks.  6. Medications: - You have been provided a prescription for narcotic pain medicine. After surgery, take 1-2 narcotic tablets every 4 hours if needed for severe pain.  Wean off of narcotics as soon as able.  Most patients do not need a second prescription of narcotics. - A prescription for anti-nausea medication will be provided in case the narcotic medicine causes nausea - take 1 tablet every 6 hours only if nauseated.  - Take enteric coated aspirin 325 mg once daily for 4 weeks to prevent blood clots.  -Take tylenol 1000 every 8 hours for pain.  May stop tylenol when you are having minimal pain. -DO NOT TAKE IBUPROFEN, ALEVE or OTHER NSAIDs as they can interfere with  bone healing.  -Take Citracal Maximum Strength (calcium citrate + vitamin D), 2 tabs daily.  If you are taking prescription medication for anxiety, depression, insomnia, muscle spasm, chronic pain, or for attention deficit disorder, you are advised that you are at a higher risk of adverse effects with use of narcotics post-op, including narcotic addiction/dependence, depressed breathing, death. If you use non-prescribed substances: alcohol, marijuana, cocaine, heroin, methamphetamines, etc., you are at a higher risk of adverse effects with use of narcotics post-op, including narcotic addiction/dependence, depressed breathing, death. You are advised that taking > 50 morphine milligram equivalents (MME) of narcotic pain medication per day results in twice the risk of overdose or death. For your prescription provided: oxycodone 5 mg - taking more than 6 tablets per day would result in > 50 morphine milligram equivalents (MME) of narcotic pain medication. Be advised that we will prescribe narcotics short-term, for acute post-operative pain, only 3 weeks for major operations such as knee repair/reconstruction surgeries.   7. Bandages: The physical therapist should change the bandages at the first post-op appointment. If needed, the dressing supplies have been provided to you.  8. Physical Therapy: 1-2 times per week. Therapy typically starts on post operative Day 3 or 4. You have been provided an order for physical therapy today and should schedule your appointments in advance to avoid delay. The therapist will provide home exercises.  9. Work or School: For most, but not all procedures, we advise staying out  of work or school for at least 1 to 2 weeks in order to recover from the stress of surgery and to allow time for healing and swelling control. If you need a work or school note this can be provided.   10. Post-Op Appointments: Your first post-op appointment will be with Dr. Allena Katz in approximately 2  weeks time. Please double check if this will be at the [facility] facility (Tuesdays and Thursdays) or [facility] facility (Wednesdays).   If you find that they have not been scheduled please call the Orthopaedic Appointment front desk at [phone].    AMBULATORY SURGERY  DISCHARGE INSTRUCTIONS   1) The drugs that you were given will stay in your system until tomorrow so for the next 24 hours you should not:  A) Drive an automobile B) Make any legal decisions C) Drink any alcoholic beverage   2) You may resume regular meals tomorrow.  Today it is better to start with liquids and gradually work up to solid foods.  You may eat anything you prefer, but it is better to start with liquids, then soup and crackers, and gradually work up to solid foods.   3) Please notify your doctor immediately if you have any unusual bleeding, trouble breathing, redness and pain at the surgery site, drainage, fever, or pain not relieved by medication.    4) Additional Instructions:        Please contact your physician with any problems or Same Day Surgery at [phone], Monday through Friday 6 am to 4 pm, or [place] [facility] number at [phone].

## 2020-05-13 NOTE — Progress Notes (Signed)
Pt given 200cc Fentanyl for pain ,then Oxy 5 IR, Dr Noralyn Pick made aware of continued pain,  and orders noted for Dilaudid,

## 2020-05-13 NOTE — Anesthesia Procedure Notes (Signed)
Procedure Name: LMA Insertion Date/Time: 05/13/2020 1:14 PM Performed by: Jaye Beagle, CRNA Pre-anesthesia Checklist: Patient identified, Emergency Drugs available, Suction available, Patient being monitored and Timeout performed Patient Re-evaluated:Patient Re-evaluated prior to induction Oxygen Delivery Method: Circle system utilized Preoxygenation: Pre-oxygenation with 100% oxygen Induction Type: IV induction Ventilation: Mask ventilation without difficulty LMA: LMA inserted LMA Size: 4.0 Laser Tube: Laser Tube Number of attempts: 1 Placement Confirmation: positive ETCO2 and breath sounds checked- equal and bilateral Tube secured with: Tape Dental Injury: Teeth and Oropharynx as per pre-operative assessment

## 2020-05-13 NOTE — Anesthesia Preprocedure Evaluation (Signed)
Anesthesia Evaluation  Patient identified by MRN, date of birth, ID band Patient awake    Reviewed: Allergy & Precautions, NPO status , Patient's Chart, lab work & pertinent test results  History of Anesthesia Complications Negative for: history of anesthetic complications  Airway Mallampati: III       Dental  (+) Poor Dentition, Chipped, Missing, Loose   Pulmonary asthma (as a child, no inhlaers x 10 years) , neg sleep apnea, Not current smoker,           Cardiovascular (-) hypertension(-) Past MI and (-) CHF (-) dysrhythmias (-) Valvular Problems/Murmurs     Neuro/Psych neg Seizures    GI/Hepatic Neg liver ROS, neg GERD  ,  Endo/Other  neg diabetes  Renal/GU negative Renal ROS     Musculoskeletal   Abdominal   Peds  Hematology   Anesthesia Other Findings   Reproductive/Obstetrics                             Anesthesia Physical Anesthesia Plan  ASA: II  Anesthesia Plan: General   Post-op Pain Management:    Induction: Intravenous  PONV Risk Score and Plan: 2 and Ondansetron and Dexamethasone  Airway Management Planned: LMA and Oral ETT  Additional Equipment:   Intra-op Plan:   Post-operative Plan:   Informed Consent: I have reviewed the patients History and Physical, chart, labs and discussed the procedure including the risks, benefits and alternatives for the proposed anesthesia with the patient or authorized representative who has indicated his/her understanding and acceptance.       Plan Discussed with:   Anesthesia Plan Comments:         Anesthesia Quick Evaluation

## 2020-05-13 NOTE — Progress Notes (Signed)
Pt sleeping at times, when awakened states his pain is a 9, but then falls asleep

## 2020-05-13 NOTE — Transfer of Care (Signed)
Immediate Anesthesia Transfer of Care Note  Patient: Lawrence Griffith.  Procedure(s) Performed: OPEN REDUCTION INTERNAL FIXATION (ORIF) OF LEFT LATERAL TIBIAL PLATEAU FRACTURE (Left Knee)  Patient Location: PACU  Anesthesia Type:General  Level of Consciousness: awake, alert  and oriented  Airway & Oxygen Therapy: Patient Spontanous Breathing and Patient connected to face mask oxygen  Post-op Assessment: Report given to RN and Post -op Vital signs reviewed and stable  Post vital signs: Reviewed and stable  Last Vitals:  Vitals Value Taken Time  BP 144/92 05/13/20 1429  Temp    Pulse 102 05/13/20 1432  Resp 11 05/13/20 1432  SpO2 100 % 05/13/20 1432  Vitals shown include unvalidated device data.  Last Pain:  Vitals:   05/13/20 1119  TempSrc: Temporal  PainSc: 9          Complications: No apparent anesthesia complications

## 2020-08-11 ENCOUNTER — Other Ambulatory Visit: Payer: Self-pay | Admitting: Orthopedic Surgery

## 2020-08-11 DIAGNOSIS — S82122D Displaced fracture of lateral condyle of left tibia, subsequent encounter for closed fracture with routine healing: Secondary | ICD-10-CM

## 2020-08-15 ENCOUNTER — Ambulatory Visit: Payer: Medicaid Other

## 2020-09-08 ENCOUNTER — Ambulatory Visit
Admission: RE | Admit: 2020-09-08 | Discharge: 2020-09-08 | Disposition: A | Discharge status: Home / Self Care | Payer: Medicaid Other | Source: Ambulatory Visit | Attending: Orthopedic Surgery | Admitting: Orthopedic Surgery

## 2020-09-08 ENCOUNTER — Other Ambulatory Visit: Payer: Self-pay

## 2020-09-08 DIAGNOSIS — S82122D Displaced fracture of lateral condyle of left tibia, subsequent encounter for closed fracture with routine healing: Secondary | ICD-10-CM | POA: Diagnosis present

## 2021-04-30 ENCOUNTER — Emergency Department
Admission: EM | Admit: 2021-04-30 | Discharge: 2021-05-01 | Disposition: A | Discharge status: Home / Self Care | Payer: Medicaid Other | Attending: Emergency Medicine | Admitting: Emergency Medicine

## 2021-04-30 ENCOUNTER — Other Ambulatory Visit: Payer: Self-pay

## 2021-04-30 DIAGNOSIS — J45909 Unspecified asthma, uncomplicated: Secondary | ICD-10-CM | POA: Insufficient documentation

## 2021-04-30 DIAGNOSIS — S6991XA Unspecified injury of right wrist, hand and finger(s), initial encounter: Secondary | ICD-10-CM | POA: Diagnosis present

## 2021-04-30 DIAGNOSIS — Y990 Civilian activity done for income or pay: Secondary | ICD-10-CM | POA: Insufficient documentation

## 2021-04-30 DIAGNOSIS — Z23 Encounter for immunization: Secondary | ICD-10-CM | POA: Insufficient documentation

## 2021-04-30 DIAGNOSIS — S61310A Laceration without foreign body of right index finger with damage to nail, initial encounter: Secondary | ICD-10-CM | POA: Insufficient documentation

## 2021-04-30 DIAGNOSIS — F17220 Nicotine dependence, chewing tobacco, uncomplicated: Secondary | ICD-10-CM | POA: Insufficient documentation

## 2021-04-30 DIAGNOSIS — W312XXA Contact with powered woodworking and forming machines, initial encounter: Secondary | ICD-10-CM | POA: Diagnosis not present

## 2021-04-30 MED ORDER — TETANUS-DIPHTH-ACELL PERTUSSIS 5-2.5-18.5 LF-MCG/0.5 IM SUSY
0.5000 mL | PREFILLED_SYRINGE | Freq: Once | INTRAMUSCULAR | Status: AC
  Administered 2021-05-01: 0.5 mL via INTRAMUSCULAR
  Filled 2021-04-30: qty 0.5

## 2021-04-30 MED ORDER — LIDOCAINE HCL (PF) 1 % IJ SOLN
10.0000 mL | Freq: Once | INTRAMUSCULAR | Status: AC
  Administered 2021-05-01: 10 mL
  Filled 2021-04-30: qty 10

## 2021-04-30 NOTE — ED Provider Notes (Incomplete)
[facility] Emergency Department Provider Note  ____________________________________________  Time seen: Approximately 11:28 PM  I have reviewed the triage vital signs and the nursing notes.   HISTORY  Chief Complaint Laceration    HPI Lawrence Griffith. is a 25 y.o. male who presents the emergency department for a laceration to the index finger of the right hand.  Patient is severely intoxicated at this time, apparently he cut his hand on a saw roughly 530 this evening.  He reports to me that he cut it on an air conditioning unit.  Patient states that he drank " "a lot" to help with the pain.  Patient is very intoxicated, difficult to keep awake for questioning.  Patient unsure of his last tetanus shot.         Past Medical History:  Diagnosis Date  . Asthma     There are no problems to display for this patient.   Past Surgical History:  Procedure Laterality Date  . ORIF TIBIA PLATEAU Left 05/13/2020   Procedure: OPEN REDUCTION INTERNAL FIXATION (ORIF) OF LEFT LATERAL TIBIAL PLATEAU FRACTURE;  Surgeon: Signa Kell, MD;  Location: [facility] ORS;  Service: Orthopedics;  Laterality: Left;    Prior to Admission medications   Medication Sig Start Date End Date Taking? Authorizing Provider  acetaminophen (TYLENOL) 500 MG tablet Take 500 mg by mouth every 6 (six) hours as needed for mild pain or moderate pain.    [provider]  acetaminophen (TYLENOL) 500 MG tablet Take 2 tablets (1,000 mg total) by mouth every 8 (eight) hours. 05/13/20 05/13/21  Signa Kell, MD  ondansetron (ZOFRAN ODT) 4 MG disintegrating tablet Take 1 tablet (4 mg total) by mouth every 8 (eight) hours as needed for nausea or vomiting. 05/13/20   Signa Kell, MD  oxyCODONE (ROXICODONE) 5 MG immediate release tablet Take 1-2 tablets (5-10 mg total) by mouth every 4 (four) hours as needed (pain). 05/13/20 05/13/21  Signa Kell, MD    Allergies Clarithromycin, Penicillamine, Penicillins,  and Sulfa antibiotics  No family history on file.  Social History Social History   Tobacco Use  . Smoking status: Never Smoker  . Smokeless tobacco: Current User    Types: Chew  Substance Use Topics  . Alcohol use: Yes    Alcohol/week: 6.0 standard drinks    Types: 6 Cans of beer per week  . Drug use: Yes    Frequency: 7.0 times per week    Types: Marijuana     Review of Systems  Constitutional: No fever/chills Eyes: No visual changes. No discharge ENT: No upper respiratory complaints. Cardiovascular: no chest pain. Respiratory: no cough. Musculoskeletal: Laceration to the right index finger Skin: Negative for rash, abrasions, lacerations, ecchymosis. Neurological: Negative for headaches, focal weakness or numbness.  10 System ROS otherwise negative.  ____________________________________________   PHYSICAL EXAM:  VITAL SIGNS: ED Triage Vitals  Enc Vitals Group     BP 04/30/21 2140 (!) 155/109     Pulse Rate 04/30/21 2140 (!) 108     Resp 04/30/21 2140 20     Temp 04/30/21 2140 98.5 F (36.9 C)     Temp Source 04/30/21 2140 Oral     SpO2 04/30/21 2140 100 %     Weight 04/30/21 2140 200 lb (90.7 kg)     Height 04/30/21 2140 5\' 7"  (1.702 m)     Head Circumference --      Peak Flow --      Pain Score 04/30/21 2155  10     Pain Loc --      Pain Edu? --      Excl. in GC? --      Constitutional: Alert and oriented. Well appearing and in no acute distress. Eyes: Conjunctivae are normal. PERRL. EOMI. Head: Atraumatic. ENT:      Ears:       Nose: No congestion/rhinnorhea.      Mouth/Throat: Mucous membranes are moist.  Neck: No stridor.    Cardiovascular: Normal rate, regular rhythm. Normal S1 and S2.  Good peripheral circulation. Respiratory: Normal respiratory effort without tachypnea or retractions. Lungs CTAB. Good air entry to the bases with no decreased or absent breath sounds. Musculoskeletal: Full range of motion to all extremities. No gross  deformities appreciated.  Visualization of the right hand reveals a laceration to the index finger.  Overall laceration is relatively superficial.  Edges are smooth in nature.  There is extends from the finger pad along the lateral aspect of the finger to the proximal nailbed.  It does involve the nail itself.  Nailbed is firmly attached.  Patient is extending and flexing the finger at this time. Neurologic:  Normal speech and language. No gross focal neurologic deficits are appreciated.  Skin:  Skin is warm, dry and intact. No rash noted. Psychiatric: Patient is clearly intoxicated, difficult to assess mental status due to the patient's inebriation.   ____________________________________________   LABS (all labs ordered are listed, but only abnormal results are displayed)  Labs Reviewed - No data to display ____________________________________________  EKG   ____________________________________________  RADIOLOGY   No results found.  ____________________________________________    PROCEDURES  Procedure(s) performed:    Procedures    Medications - No data to display   ____________________________________________   INITIAL IMPRESSION / ASSESSMENT AND PLAN / ED COURSE  Pertinent labs & imaging results that were available during my care of the patient were reviewed by me and considered in my medical decision making (see chart for details).  Review of the  CSRS was performed in accordance of the [facility] prior to dispensing any controlled drugs.           Patient's diagnosis is consistent with ***. Patient will be discharged home with prescriptions for ***. Patient is to follow up with *** as needed or otherwise directed. Patient is given ED precautions to return to the ED for any worsening or new symptoms.     ____________________________________________  FINAL CLINICAL IMPRESSION(S) / ED DIAGNOSES  Final diagnoses:  None      NEW MEDICATIONS STARTED  DURING THIS VISIT:  ED Discharge Orders    None          This chart was dictated using voice recognition software/Dragon. Despite best efforts to proofread, errors can occur which can change the meaning. Any change was purely unintentional.

## 2021-04-30 NOTE — ED Triage Notes (Signed)
Pt was working with a saw and has lac to right index finger. Positive etoh.

## 2021-04-30 NOTE — ED Provider Notes (Signed)
[facility] Emergency Department Provider Note  ____________________________________________  Time seen: Approximately 11:28 PM  I have reviewed the triage vital signs and the nursing notes.   HISTORY  Chief Complaint Laceration    HPI Lawrence Griffith. is a 25 y.o. male who presents the emergency department for a laceration to the index finger of the right hand.  Patient is severely intoxicated at this time, apparently he cut his hand on a saw roughly 530 this evening.  He reports to me that he cut it on an air conditioning unit.  Patient states that he drank " "a lot" to help with the pain.  Patient is very intoxicated, difficult to keep awake for questioning.  Patient unsure of his last tetanus shot.         Past Medical History:  Diagnosis Date  . Asthma     There are no problems to display for this patient.   Past Surgical History:  Procedure Laterality Date  . ORIF TIBIA PLATEAU Left 05/13/2020   Procedure: OPEN REDUCTION INTERNAL FIXATION (ORIF) OF LEFT LATERAL TIBIAL PLATEAU FRACTURE;  Surgeon: Signa Kell, MD;  Location: [facility] ORS;  Service: Orthopedics;  Laterality: Left;    Prior to Admission medications   Medication Sig Start Date End Date Taking? Authorizing Provider  cephALEXin (KEFLEX) 500 MG capsule Take 2 capsules (1,000 mg total) by mouth 2 (two) times daily. 05/01/21  Yes Daesean Lazarz, Delorise Royals, PA-C  meloxicam (MOBIC) 15 MG tablet Take 1 tablet (15 mg total) by mouth daily. 05/01/21  Yes Tysen Roesler, Delorise Royals, PA-C  methocarbamol (ROBAXIN) 500 MG tablet Take 1 tablet (500 mg total) by mouth 4 (four) times daily. 05/01/21  Yes Kinslea Frances, Delorise Royals, PA-C  acetaminophen (TYLENOL) 500 MG tablet Take 500 mg by mouth every 6 (six) hours as needed for mild pain or moderate pain.    [provider]  acetaminophen (TYLENOL) 500 MG tablet Take 2 tablets (1,000 mg total) by mouth every 8 (eight) hours. 05/13/20 05/13/21  Signa Kell, MD   ondansetron (ZOFRAN ODT) 4 MG disintegrating tablet Take 1 tablet (4 mg total) by mouth every 8 (eight) hours as needed for nausea or vomiting. 05/13/20   Signa Kell, MD  oxyCODONE (ROXICODONE) 5 MG immediate release tablet Take 1-2 tablets (5-10 mg total) by mouth every 4 (four) hours as needed (pain). 05/13/20 05/13/21  Signa Kell, MD    Allergies Clarithromycin, Penicillamine, Penicillins, and Sulfa antibiotics  No family history on file.  Social History Social History   Tobacco Use  . Smoking status: Never Smoker  . Smokeless tobacco: Current User    Types: Chew  Substance Use Topics  . Alcohol use: Yes    Alcohol/week: 6.0 standard drinks    Types: 6 Cans of beer per week  . Drug use: Yes    Frequency: 7.0 times per week    Types: Marijuana     Review of Systems  Constitutional: No fever/chills Eyes: No visual changes. No discharge ENT: No upper respiratory complaints. Cardiovascular: no chest pain. Respiratory: no cough. Musculoskeletal: Laceration to the right index finger Skin: Negative for rash, abrasions, lacerations, ecchymosis. Neurological: Negative for headaches, focal weakness or numbness.  10 System ROS otherwise negative.  ____________________________________________   PHYSICAL EXAM:  VITAL SIGNS: ED Triage Vitals  Enc Vitals Group     BP 04/30/21 2140 (!) 155/109     Pulse Rate 04/30/21 2140 (!) 108     Resp 04/30/21 2140 20  Temp 04/30/21 2140 98.5 F (36.9 C)     Temp Source 04/30/21 2140 Oral     SpO2 04/30/21 2140 100 %     Weight 04/30/21 2140 200 lb (90.7 kg)     Height 04/30/21 2140 5\' 7"  (1.702 m)     Head Circumference --      Peak Flow --      Pain Score 04/30/21 2155 10     Pain Loc --      Pain Edu? --      Excl. in GC? --      Constitutional: Alert and oriented. Well appearing and in no acute distress. Eyes: Conjunctivae are normal. PERRL. EOMI. Head: Atraumatic. ENT:      Ears:       Nose: No  congestion/rhinnorhea.      Mouth/Throat: Mucous membranes are moist.  Neck: No stridor.    Cardiovascular: Normal rate, regular rhythm. Normal S1 and S2.  Good peripheral circulation. Respiratory: Normal respiratory effort without tachypnea or retractions. Lungs CTAB. Good air entry to the bases with no decreased or absent breath sounds. Musculoskeletal: Full range of motion to all extremities. No gross deformities appreciated.  Visualization of the right hand reveals a laceration to the index finger.  Overall laceration is relatively superficial.  Edges are smooth in nature.  There is extends from the finger pad along the lateral aspect of the finger to the proximal nailbed.  It does involve the nail itself.  Nailbed is firmly attached.  Patient is extending and flexing the finger at this time. Neurologic:  Normal speech and language. No gross focal neurologic deficits are appreciated.  Skin:  Skin is warm, dry and intact. No rash noted. Psychiatric: Patient is clearly intoxicated, difficult to assess mental status due to the patient's inebriation.   ____________________________________________   LABS (all labs ordered are listed, but only abnormal results are displayed)  Labs Reviewed - No data to display ____________________________________________  EKG   ____________________________________________  RADIOLOGY   No results found.  ____________________________________________    PROCEDURES  Procedure(s) performed:    2156Marland KitchenLaceration Repair  Date/Time: 05/01/2021 12:18 AM Performed by: 05/03/2021, PA-C Authorized by: Racheal Patches, PA-C   Consent:    Consent obtained:  Verbal   Consent given by:  Patient   Risks discussed:  Pain, poor wound healing and infection Universal protocol:    Procedure explained and questions answered to patient or proxy's satisfaction: yes     Immediately prior to procedure, a time out was called: yes     Patient identity  confirmed:  Verbally with patient and arm band Anesthesia:    Anesthesia method:  Nerve block   Block location:  Index finger R hand   Block needle gauge:  27 G   Block anesthetic:  Lidocaine 1% w/o epi   Block technique:  Digital block   Block injection procedure:  Anatomic landmarks identified, introduced needle, negative aspiration for blood and incremental injection   Block outcome:  Anesthesia achieved Laceration details:    Location:  Finger   Finger location:  R index finger   Length (cm):  2 Pre-procedure details:    Preparation:  Patient was prepped and draped in usual sterile fashion Exploration:    Hemostasis achieved with:  Direct pressure   Imaging outcome: foreign body not noted     Wound exploration: wound explored through full range of motion and entire depth of wound visualized     Wound extent: no  foreign bodies/material noted, no nerve damage noted, no tendon damage noted and no underlying fracture noted     Contaminated: no   Treatment:    Area cleansed with:  Povidone-iodine and saline   Amount of cleaning:  Extensive   Irrigation solution:  Sterile saline   Irrigation volume:  1L   Irrigation method:  Syringe Skin repair:    Repair method:  Sutures   Suture size:  4-0   Suture material:  Nylon   Suture technique:  Running locked   Number of sutures:  1 (1 running interlock suture with 9 throws) Approximation:    Approximation:  Close Repair type:    Repair type:  Simple Post-procedure details:    Dressing:  Open (no dressing)   Procedure completion:  Tolerated well, no immediate complications      Medications  Tdap (BOOSTRIX) injection 0.5 mL (0.5 mLs Intramuscular Given 05/01/21 0000)  lidocaine (PF) (XYLOCAINE) 1 % injection 10 mL (10 mLs Infiltration Given 05/01/21 0000)     ____________________________________________   INITIAL IMPRESSION / ASSESSMENT AND PLAN / ED COURSE  Pertinent labs & imaging results that were available during my  care of the patient were reviewed by me and considered in my medical decision making (see chart for details).  Review of the [place] CSRS was performed in accordance of the [facility] prior to dispensing any controlled drugs.           Patient's diagnosis is consistent with finger laceration.  Patient presented to the emergency department after sustaining a laceration earlier today.  Unsure whether patient cut it on a saw versus the edge of an air conditioning unit.  Edges were smooth in nature.  No visible foreign body.  Bleeding controlled prior to arrival.  Laceration was closed as described above no complications.  Wound care instructions discussed with the patient.  Patiently placed on antibiotics prophylactically.  While in the room patient asked for a prescription for his back for an anti-inflammatory muscle relaxer.  There is no assessment of his back pain as this was a work comp finger injury but I will prescribe meloxicam and Robaxin for the patient.  Patient with antibiotics prophylactically.  Follow-up primary care or urgent care in 1 week for suture removal. Patient is given ED precautions to return to the ED for any worsening or new symptoms.     ____________________________________________  FINAL CLINICAL IMPRESSION(S) / ED DIAGNOSES  Final diagnoses:  Laceration of right index finger without foreign body with damage to nail, initial encounter      NEW MEDICATIONS STARTED DURING THIS VISIT:  ED Discharge Orders         Ordered    cephALEXin (KEFLEX) 500 MG capsule  2 times daily        05/01/21 0018    meloxicam (MOBIC) 15 MG tablet  Daily        05/01/21 0018    methocarbamol (ROBAXIN) 500 MG tablet  4 times daily        05/01/21 0018              This chart was dictated using voice recognition software/Dragon. Despite best efforts to proofread, errors can occur which can change the meaning. Any change was purely unintentional.    Racheal Patches,  PA-C 05/01/21 Redgie Grayer, MD 05/05/21 2059

## 2021-04-30 NOTE — ED Notes (Signed)
Patient reports cutting the pointer finger on his right hand on a saw about 5:30pm today. Patient reports "I drank a lot to help the pain." Patient unsure of last tetanus shot. Bandage present on arrival removed by this RN. No active bleeding noted.

## 2021-05-01 MED ORDER — METHOCARBAMOL 500 MG PO TABS: 500.0000 mg | ORAL_TABLET | Freq: Four times a day (QID) | ORAL | 0 refills | Status: DC

## 2021-05-01 MED ORDER — MELOXICAM 15 MG PO TABS
15.0000 mg | ORAL_TABLET | Freq: Every day | ORAL | 0 refills | Status: DC
Start: 2021-05-01 — End: 2023-11-18

## 2021-05-01 MED ORDER — CEPHALEXIN 500 MG PO CAPS: 1000.0000 mg | ORAL_CAPSULE | Freq: Two times a day (BID) | ORAL | 0 refills | Status: DC

## 2021-06-10 ENCOUNTER — Other Ambulatory Visit: Payer: Self-pay

## 2021-06-10 ENCOUNTER — Encounter: Payer: Self-pay | Admitting: *Deleted

## 2021-06-10 ENCOUNTER — Emergency Department
Admission: EM | Admit: 2021-06-10 | Discharge: 2021-06-10 | Disposition: A | Discharge status: Home / Self Care | Payer: Medicaid Other | Attending: Emergency Medicine | Admitting: Emergency Medicine

## 2021-06-10 DIAGNOSIS — F17220 Nicotine dependence, chewing tobacco, uncomplicated: Secondary | ICD-10-CM | POA: Insufficient documentation

## 2021-06-10 DIAGNOSIS — W268XXA Contact with other sharp object(s), not elsewhere classified, initial encounter: Secondary | ICD-10-CM | POA: Diagnosis not present

## 2021-06-10 DIAGNOSIS — S6992XA Unspecified injury of left wrist, hand and finger(s), initial encounter: Secondary | ICD-10-CM | POA: Diagnosis present

## 2021-06-10 DIAGNOSIS — S61215A Laceration without foreign body of left ring finger without damage to nail, initial encounter: Secondary | ICD-10-CM | POA: Insufficient documentation

## 2021-06-10 DIAGNOSIS — J45909 Unspecified asthma, uncomplicated: Secondary | ICD-10-CM | POA: Diagnosis not present

## 2021-06-10 NOTE — ED Triage Notes (Signed)
Pt cut his left hand 4th finger on a metal pipe, bleeding controlled, reports had a recent tetanus shot.

## 2021-06-10 NOTE — ED Provider Notes (Signed)
[facility] Emergency Department Provider Note  ____________________________________________   Event Date/Time   First MD Initiated Contact with Patient 06/10/21 [phone]     (approximate)  I have reviewed the triage vital signs and the nursing notes.   HISTORY  Chief Complaint Laceration    HPI Lawrence Griffith. is a 25 y.o. male with a laceration near the nail on his left ring finger.  He said it was made by a pipe.  He is up-to-date on his tetanus vaccination from another cut about a month ago.  The bleeding is well controlled.  He said he does not think it is a big deal but he thought he should get it checked out.  No foreign bodies, mildly uncomfortable, no significant pain.     Past Medical History:  Diagnosis Date   Asthma     There are no problems to display for this patient.   Past Surgical History:  Procedure Laterality Date   ORIF TIBIA PLATEAU Left 05/13/2020   Procedure: OPEN REDUCTION INTERNAL FIXATION (ORIF) OF LEFT LATERAL TIBIAL PLATEAU FRACTURE;  Surgeon: Signa Kell, MD;  Location: [facility] ORS;  Service: Orthopedics;  Laterality: Left;    Prior to Admission medications   Medication Sig Start Date End Date Taking? Authorizing Provider  acetaminophen (TYLENOL) 500 MG tablet Take 500 mg by mouth every 6 (six) hours as needed for mild pain or moderate pain.    [provider]  cephALEXin (KEFLEX) 500 MG capsule Take 2 capsules (1,000 mg total) by mouth 2 (two) times daily. 05/01/21   Cuthriell, Delorise Royals, PA-C  meloxicam (MOBIC) 15 MG tablet Take 1 tablet (15 mg total) by mouth daily. 05/01/21   Cuthriell, Delorise Royals, PA-C  methocarbamol (ROBAXIN) 500 MG tablet Take 1 tablet (500 mg total) by mouth 4 (four) times daily. 05/01/21   Cuthriell, Delorise Royals, PA-C  ondansetron (ZOFRAN ODT) 4 MG disintegrating tablet Take 1 tablet (4 mg total) by mouth every 8 (eight) hours as needed for nausea or vomiting. 05/13/20   Signa Kell, MD     Allergies Clarithromycin, Penicillamine, Penicillins, and Sulfa antibiotics  No family history on file.  Social History Social History   Tobacco Use   Smoking status: Never   Smokeless tobacco: Current    Types: Chew  Substance Use Topics   Alcohol use: Yes    Alcohol/week: 6.0 standard drinks    Types: 6 Cans of beer per week   Drug use: Yes    Frequency: 7.0 times per week    Types: Marijuana    Review of Systems Constitutional: No fever/chills Respiratory: Denies shortness of breath. Gastrointestinal: No abdominal pain. Musculoskeletal: Negative for neck pain.  Negative for back pain. Neurological: Negative for headaches, focal weakness or numbness. Skin: Small laceration to left ring finger.  ____________________________________________   PHYSICAL EXAM:  VITAL SIGNS: ED Triage Vitals [06/10/21 0059]  Enc Vitals Group     BP (!) 142/89     Pulse Rate 95     Resp 16     Temp 97.8 F (36.6 C)     Temp Source Oral     SpO2 99 %     Weight      Height      Head Circumference      Peak Flow      Pain Score 5     Pain Loc      Pain Edu?      Excl. in GC?  Constitutional: Alert and oriented.  Eyes: Conjunctivae are normal.  Head: Atraumatic. Cardiovascular: Normal rate, regular rhythm. Good peripheral circulation. Respiratory: Normal respiratory effort.  No retractions. Musculoskeletal: No gross deformities of extremities. Neurologic:  Normal speech and language. No gross focal neurologic deficits are appreciated.  Skin:  Skin is warm, dry and intact.  Small superficial laceration flap to distal phalanx of left ring finger, near the nail but not including it. Psychiatric: Mood and affect are normal. Speech and behavior are normal.  ____________________________________________   PROCEDURES   Procedure(s) performed (including Critical Care):  Marland KitchenMarland KitchenLaceration Repair  Date/Time: 06/10/2021 5:38 AM Performed by: Loleta Rose, MD Authorized by:  Loleta Rose, MD   Consent:    Consent obtained:  Verbal   Consent given by:  Patient Anesthesia:    Anesthesia method:  None Laceration details:    Location:  Finger   Finger location:  L ring finger   Length (cm):  0.5 Exploration:    Contaminated: no   Treatment:    Amount of cleaning:  Standard   Irrigation solution:  Tap water   Visualized foreign bodies/material removed: no   Skin repair:    Repair method:  Tissue adhesive Approximation:    Approximation:  Close Repair type:    Repair type:  Simple Post-procedure details:    Dressing:  Open (no dressing)   Procedure completion:  Tolerated well, no immediate complications   ____________________________________________   INITIAL IMPRESSION / MDM / ASSESSMENT AND PLAN / ED COURSE  As part of my medical decision making, I reviewed the following data within the electronic MEDICAL RECORD NUMBER Nursing notes reviewed and incorporated, Old chart reviewed, and Notes from prior ED visits   Superficial finger laceration, no foreign bodies, cleaned extensively at the sink.  Would likely heal just fine by secondary intention but I will closed with Dermabond.  I gave my usual and customary return precautions.      ____________________________________________  FINAL CLINICAL IMPRESSION(S) / ED DIAGNOSES  Final diagnoses:  Laceration of left ring finger without foreign body without damage to nail, initial encounter     MEDICATIONS GIVEN DURING THIS VISIT:  Medications - No data to display   ED Discharge Orders     None        Note:  This document was prepared using Dragon voice recognition software and may include unintentional dictation errors.   Loleta Rose, MD 06/10/21 [phone]

## 2021-06-10 NOTE — ED Notes (Signed)
Verified correct patient and correct discharge papers given. Pt alert and oriented X 4, stable for discharge. RR even and unlabored, color WNL. Discussed discharge instructions and follow-up as directed. Discharge medications discussed, when prescribed. Pt had opportunity to ask questions, and RN available to provide patient and/or family education.   

## 2021-07-15 ENCOUNTER — Encounter: Payer: Self-pay | Admitting: Emergency Medicine

## 2021-07-15 ENCOUNTER — Other Ambulatory Visit: Payer: Self-pay

## 2021-07-15 ENCOUNTER — Emergency Department: Payer: Medicaid Other

## 2021-07-15 DIAGNOSIS — J45909 Unspecified asthma, uncomplicated: Secondary | ICD-10-CM | POA: Diagnosis not present

## 2021-07-15 DIAGNOSIS — S62034A Nondisplaced fracture of proximal third of navicular [scaphoid] bone of right wrist, initial encounter for closed fracture: Secondary | ICD-10-CM | POA: Diagnosis not present

## 2021-07-15 DIAGNOSIS — S6991XA Unspecified injury of right wrist, hand and finger(s), initial encounter: Secondary | ICD-10-CM | POA: Diagnosis present

## 2021-07-15 DIAGNOSIS — M25531 Pain in right wrist: Secondary | ICD-10-CM | POA: Insufficient documentation

## 2021-07-15 DIAGNOSIS — F17200 Nicotine dependence, unspecified, uncomplicated: Secondary | ICD-10-CM | POA: Insufficient documentation

## 2021-07-15 DIAGNOSIS — W010XXA Fall on same level from slipping, tripping and stumbling without subsequent striking against object, initial encounter: Secondary | ICD-10-CM | POA: Insufficient documentation

## 2021-07-15 NOTE — ED Triage Notes (Signed)
Pt arrived via POV with reports of R wrist injury 2 nights ago, states he tripped and fell in the dark getting up and c/o pain and decreased ROM.

## 2021-07-16 ENCOUNTER — Emergency Department
Admission: EM | Admit: 2021-07-16 | Discharge: 2021-07-16 | Disposition: A | Discharge status: Home / Self Care | Payer: Medicaid Other | Attending: Emergency Medicine | Admitting: Emergency Medicine

## 2021-07-16 DIAGNOSIS — S62034A Nondisplaced fracture of proximal third of navicular [scaphoid] bone of right wrist, initial encounter for closed fracture: Secondary | ICD-10-CM

## 2021-07-16 DIAGNOSIS — S62009A Unspecified fracture of navicular [scaphoid] bone of unspecified wrist, initial encounter for closed fracture: Secondary | ICD-10-CM | POA: Insufficient documentation

## 2021-07-16 MED ORDER — IBUPROFEN 800 MG PO TABS
800.0000 mg | ORAL_TABLET | Freq: Once | ORAL | Status: AC
  Administered 2021-07-16: 800 mg via ORAL
  Filled 2021-07-16: qty 1

## 2021-07-16 MED ORDER — CYCLOBENZAPRINE HCL 10 MG PO TABS
10.0000 mg | ORAL_TABLET | Freq: Once | ORAL | Status: AC
  Administered 2021-07-16: 10 mg via ORAL
  Filled 2021-07-16: qty 1

## 2021-07-16 NOTE — ED Provider Notes (Signed)
[facility] Emergency Department Provider Note  ____________________________________________  Time seen: Approximately 1:53 AM  I have reviewed the triage vital signs and the nursing notes.   HISTORY  Chief Complaint Wrist Pain   HPI Lawrence Griffith. is a 25 y.o. male right-handed dominant who presents for evaluation of right wrist pain.  Patient reports that 2 nights ago he tripped and fell in the dark onto an outstretched hand.  Is complaining of right wrist pain since the fall.  Denies any head trauma or LOC.  No other injuries.  He has not taken anything at home for the pain.  His pain is constant and throbbing, moderate in intensity and worse with movement of the wrist.   Past Medical History:  Diagnosis Date   Asthma     There are no problems to display for this patient.   Past Surgical History:  Procedure Laterality Date   ORIF TIBIA PLATEAU Left 05/13/2020   Procedure: OPEN REDUCTION INTERNAL FIXATION (ORIF) OF LEFT LATERAL TIBIAL PLATEAU FRACTURE;  Surgeon: Signa Kell, MD;  Location: [facility] ORS;  Service: Orthopedics;  Laterality: Left;    Prior to Admission medications   Medication Sig Start Date End Date Taking? Authorizing Provider  acetaminophen (TYLENOL) 500 MG tablet Take 500 mg by mouth every 6 (six) hours as needed for mild pain or moderate pain.    [provider]  cephALEXin (KEFLEX) 500 MG capsule Take 2 capsules (1,000 mg total) by mouth 2 (two) times daily. 05/01/21   Cuthriell, Delorise Royals, PA-C  meloxicam (MOBIC) 15 MG tablet Take 1 tablet (15 mg total) by mouth daily. 05/01/21   Cuthriell, Delorise Royals, PA-C  methocarbamol (ROBAXIN) 500 MG tablet Take 1 tablet (500 mg total) by mouth 4 (four) times daily. 05/01/21   Cuthriell, Delorise Royals, PA-C  ondansetron (ZOFRAN ODT) 4 MG disintegrating tablet Take 1 tablet (4 mg total) by mouth every 8 (eight) hours as needed for nausea or vomiting. 05/13/20   Signa Kell, MD     Allergies Clarithromycin, Penicillamine, Penicillins, and Sulfa antibiotics  History reviewed. No pertinent family history.  Social History Social History   Tobacco Use   Smoking status: Never   Smokeless tobacco: Current    Types: Chew  Substance Use Topics   Alcohol use: Yes    Alcohol/week: 6.0 standard drinks    Types: 6 Cans of beer per week   Drug use: Yes    Frequency: 7.0 times per week    Types: Marijuana    Review of Systems  Constitutional: Negative for fever. Eyes: Negative for visual changes. ENT: Negative for sore throat. Neck: No neck pain  Cardiovascular: Negative for chest pain. Respiratory: Negative for shortness of breath. Gastrointestinal: Negative for abdominal pain, vomiting or diarrhea. Genitourinary: Negative for dysuria. Musculoskeletal: Negative for back pain. + R wrist pain Skin: Negative for rash. Neurological: Negative for headaches, weakness or numbness. Psych: No SI or HI  ____________________________________________   PHYSICAL EXAM:  VITAL SIGNS: ED Triage Vitals  Enc Vitals Group     BP 07/15/21 2123 (!) 168/109     Pulse Rate 07/15/21 2123 (!) 110     Resp 07/15/21 2123 18     Temp 07/15/21 2123 99.7 F (37.6 C)     Temp Source 07/15/21 2123 Oral     SpO2 07/15/21 2123 98 %     Weight 07/15/21 2121 275 lb (124.7 kg)     Height 07/15/21 2121 5\' 6"  (1.676 m)  Head Circumference --      Peak Flow --      Pain Score 07/15/21 2121 10     Pain Loc --      Pain Edu? --      Excl. in GC? --     Constitutional: Alert and oriented. Well appearing and in no apparent distress. HEENT:      Head: Normocephalic and atraumatic.         Eyes: Conjunctivae are normal. Sclera is non-icteric.       Mouth/Throat: Mucous membranes are moist.       Neck: Supple with no signs of meningismus.  No C-spine tenderness Cardiovascular: Regular rate and rhythm.  Respiratory: Normal respiratory effort.  Musculoskeletal: Patient is tender  to palpation on the right snuffbox with swelling of the right wrist and tenderness with range of motion.  All other extremities have full painless range of motion and look atraumatic Neurologic: Normal speech and language. Face is symmetric. Moving all extremities. No gross focal neurologic deficits are appreciated. Skin: Skin is warm, dry and intact. No rash noted. Psychiatric: Mood and affect are normal. Speech and behavior are normal.  ____________________________________________   LABS (all labs ordered are listed, but only abnormal results are displayed)  Labs Reviewed - No data to display ____________________________________________  EKG  none  ____________________________________________  RADIOLOGY  I have personally reviewed the images performed during this visit and I agree with the Radiologist's read.   Interpretation by Radiologist:  DG Wrist Complete Right  Result Date: 07/15/2021 CLINICAL DATA:  Pain with decreased range of motion EXAM: RIGHT WRIST - COMPLETE 3+ VIEW COMPARISON:  09/29/2017 FINDINGS: Suspected acute nondisplaced fracture involving the proximal pole of the scaphoid. No malalignment. Positive for soft tissue swelling IMPRESSION: Suspected acute nondisplaced fracture involving the proximal pole of the scaphoid Electronically Signed   By: Jasmine Pang M.D.   On: 07/15/2021 21:50     ____________________________________________   PROCEDURES  Procedure(s) performed:yes .Ortho Injury Treatment  Date/Time: 07/16/2021 1:54 AM Performed by: Nita Sickle, MD Authorized by: Nita Sickle, MD   Consent:    Consent obtained:  Verbal   Consent given by:  Patient   Risks discussed:  Fracture, nerve damage, restricted joint movement and stiffness   Alternatives discussed:  ReferralInjury location: wrist Location details: right wrist Injury type: fracture Fracture type: scaphoid Pre-procedure neurovascular assessment: neurovascularly  intact Pre-procedure distal perfusion: normal Pre-procedure neurological function: normal Pre-procedure range of motion: reduced  Anesthesia: Local anesthesia used: no  Patient sedated: NoManipulation performed: no Immobilization: splint Splint type: thumb spica Splint Applied by: ED Tech Supplies used: cotton padding, elastic bandage and Ortho-Glass Post-procedure neurovascular assessment: post-procedure neurovascularly intact Post-procedure distal perfusion: normal Post-procedure neurological function: normal Post-procedure range of motion: unchanged   Critical Care performed:  None ____________________________________________   INITIAL IMPRESSION / ASSESSMENT AND PLAN / ED COURSE  25 y.o. male right-handed dominant who presents for evaluation of right wrist pain.  Patient with swelling over the radius aspect of the right wrist with tenderness to palpation in the snuffbox.  X-ray with suspected acute fracture nondisplaced of the proximal pole of the scaphoid.  Patient placed on a thumb spica splint and will be discharged home with follow-up with [facility].  Discussed splint care and my standard return precautions.      _____________________________________________ Please note:  Patient was evaluated in Emergency Department today for the symptoms described in the history of present illness. Patient was evaluated in the context of the  global COVID-19 pandemic, which necessitated consideration that the patient might be at risk for infection with the SARS-CoV-2 virus that causes COVID-19. Institutional protocols and algorithms that pertain to the evaluation of patients at risk for COVID-19 are in a state of rapid change based on information released by regulatory bodies including the CDC and federal and state organizations. These policies and algorithms were followed during the patient's care in the ED.  Some ED evaluations and interventions may be delayed as a result of limited  staffing during the pandemic.   [place] Controlled Substance Database was reviewed by me. ____________________________________________   FINAL CLINICAL IMPRESSION(S) / ED DIAGNOSES   Final diagnoses:  Closed nondisplaced fracture of proximal third of scaphoid bone of right wrist, initial encounter      NEW MEDICATIONS STARTED DURING THIS VISIT:  ED Discharge Orders     None        Note:  This document was prepared using Dragon voice recognition software and may include unintentional dictation errors.    Don Perking, [place], MD 07/16/21 0157

## 2021-07-16 NOTE — Discharge Instructions (Signed)
As we discussed, your x-ray showed a fracture of one of the bones of your wrist.  It is very important that you keep the splint in place.  Do not get it wet or dirty.  You will need follow-up with orthopedics for reevaluation.  Do not remove the splint until you are cleared by them.  You may take 800 mg of ibuprofen every 8 hours as needed for pain.    If your pain becomes severe, if your fingers become blue/purple or pale, or if you experience pins-and-needles sensation in your fingers and hand, remove the elastic wrap and reapply it looser.  Do not change the hard part of the splint.  If that resolves your symptoms it is okay to stay home and follow-up with orthopedics.  If you continue to have those symptoms please return to the emergency room immediately.

## 2021-07-22 ENCOUNTER — Other Ambulatory Visit: Payer: Self-pay | Admitting: Physician Assistant

## 2021-07-22 DIAGNOSIS — S62001D Unspecified fracture of navicular [scaphoid] bone of right wrist, subsequent encounter for fracture with routine healing: Secondary | ICD-10-CM

## 2021-08-03 ENCOUNTER — Ambulatory Visit: Payer: Medicaid Other

## 2021-08-04 ENCOUNTER — Other Ambulatory Visit: Payer: Self-pay | Admitting: Physician Assistant

## 2021-08-04 ENCOUNTER — Other Ambulatory Visit: Payer: Self-pay

## 2021-08-04 ENCOUNTER — Ambulatory Visit
Admission: RE | Admit: 2021-08-04 | Discharge: 2021-08-04 | Disposition: A | Discharge status: Home / Self Care | Payer: Medicaid Other | Source: Ambulatory Visit | Attending: Physician Assistant | Admitting: Physician Assistant

## 2021-08-04 DIAGNOSIS — S62001D Unspecified fracture of navicular [scaphoid] bone of right wrist, subsequent encounter for fracture with routine healing: Secondary | ICD-10-CM

## 2021-08-13 ENCOUNTER — Ambulatory Visit: Payer: Self-pay | Admitting: Internal Medicine

## 2021-10-20 ENCOUNTER — Other Ambulatory Visit: Payer: Self-pay | Admitting: Orthopaedic Surgery

## 2021-10-20 DIAGNOSIS — S62001D Unspecified fracture of navicular [scaphoid] bone of right wrist, subsequent encounter for fracture with routine healing: Secondary | ICD-10-CM

## 2021-11-18 ENCOUNTER — Ambulatory Visit: Payer: Medicaid Other | Attending: Orthopaedic Surgery

## 2022-05-05 NOTE — Progress Notes (Unsigned)
   There were no vitals taken for this visit.   Subjective:    Patient ID: Lawrence Mar., male    DOB: 18-Sep-1996, 26 y.o.   MRN: [id number]  HPI: Lawrence Griffith. is a 27 y.o. male  No chief complaint on file.  Patient presents to clinic to establish care with new PCP.  Introduced to Publishing rights manager role and practice setting.  All questions answered.  Discussed provider/patient relationship and expectations.  Patient reports a history of ***. Patient denies a history of: Hypertension, Elevated Cholesterol, Diabetes, Thyroid problems, Depression, Anxiety, Neurological problems, and Abdominal problems.   Active Ambulatory Problems    Diagnosis Date Noted   No Active Ambulatory Problems   Resolved Ambulatory Problems    Diagnosis Date Noted   No Resolved Ambulatory Problems   Past Medical History:  Diagnosis Date   Asthma    Past Surgical History:  Procedure Laterality Date   ORIF TIBIA PLATEAU Left 05/13/2020   Procedure: OPEN REDUCTION INTERNAL FIXATION (ORIF) OF LEFT LATERAL TIBIAL PLATEAU FRACTURE;  Surgeon: Signa Kell, MD;  Location: [facility] ORS;  Service: Orthopedics;  Laterality: Left;   No family history on file.   Review of Systems  Per HPI unless specifically indicated above     Objective:    There were no vitals taken for this visit.  Wt Readings from Last 3 Encounters:  07/15/21 275 lb (124.7 kg)  04/30/21 200 lb (90.7 kg)  05/04/20 180 lb (81.6 kg)    Physical Exam  Results for orders placed or performed during the hospital encounter of 05/13/20  Urine Drug Screen, Qualitative ([facility] only)  Result Value Ref Range   Tricyclic, Ur Screen NONE DETECTED NONE DETECTED   Amphetamines, Ur Screen NONE DETECTED NONE DETECTED   MDMA (Ecstasy)Ur Screen NONE DETECTED NONE DETECTED   Cocaine Metabolite,Ur [place] NONE DETECTED NONE DETECTED   Opiate, Ur Screen NONE DETECTED NONE DETECTED   Phencyclidine (PCP) Ur S NONE DETECTED NONE DETECTED   Cannabinoid  50 Ng, Ur [place] POSITIVE (A) NONE DETECTED   Barbiturates, Ur Screen NONE DETECTED NONE DETECTED   Benzodiazepine, Ur Scrn NONE DETECTED NONE DETECTED   Methadone Scn, Ur NONE DETECTED NONE DETECTED      Assessment & Plan:   Problem List Items Addressed This Visit   None Visit Diagnoses     Encounter to establish care    -  Primary        Follow up plan: No follow-ups on file.

## 2022-05-06 ENCOUNTER — Ambulatory Visit: Payer: Medicaid Other | Admitting: Nurse Practitioner

## 2022-05-06 DIAGNOSIS — Z7689 Persons encountering health services in other specified circumstances: Secondary | ICD-10-CM

## 2023-01-18 ENCOUNTER — Emergency Department
Admission: EM | Admit: 2023-01-18 | Discharge: 2023-01-18 | Discharge status: Against Medical Advice | Payer: Medicaid Other | Attending: Emergency Medicine | Admitting: Emergency Medicine

## 2023-01-18 ENCOUNTER — Encounter: Payer: Self-pay | Admitting: Emergency Medicine

## 2023-01-18 DIAGNOSIS — Z5321 Procedure and treatment not carried out due to patient leaving prior to being seen by health care provider: Secondary | ICD-10-CM | POA: Diagnosis present

## 2023-01-18 NOTE — ED Triage Notes (Signed)
Pt presents from [place] [facility] with [employer]. Pt states that he has no complaints and does not want to be seen. Denies SI/HI. Pt cooperative in triage. Pt is requesting to go back to his [place] room.   This RN attempted to call the patient his Moms listed number without success.

## 2023-01-18 NOTE — ED Notes (Signed)
Pt able to get in contact with [employer] who will provide the patient a ride back to his motel.

## 2023-01-18 NOTE — ED Notes (Signed)
Pt seen leaving with [employer].

## 2023-03-04 ENCOUNTER — Other Ambulatory Visit: Payer: Self-pay

## 2023-03-04 ENCOUNTER — Encounter: Payer: Self-pay | Admitting: Emergency Medicine

## 2023-03-04 ENCOUNTER — Emergency Department
Admission: EM | Admit: 2023-03-04 | Discharge: 2023-03-04 | Disposition: A | Discharge status: Home / Self Care | Payer: Medicaid Other | Attending: Emergency Medicine | Admitting: Emergency Medicine

## 2023-03-04 DIAGNOSIS — L03011 Cellulitis of right finger: Secondary | ICD-10-CM | POA: Insufficient documentation

## 2023-03-04 DIAGNOSIS — M7989 Other specified soft tissue disorders: Secondary | ICD-10-CM | POA: Diagnosis present

## 2023-03-04 MED ORDER — LIDOCAINE-PRILOCAINE 2.5-2.5 % EX CREA
TOPICAL_CREAM | CUTANEOUS | Status: AC
  Filled 2023-03-04: qty 5

## 2023-03-04 MED ORDER — DOXYCYCLINE HYCLATE 100 MG PO TABS
100.0000 mg | ORAL_TABLET | Freq: Once | ORAL | Status: AC
  Administered 2023-03-04: 100 mg via ORAL
  Filled 2023-03-04: qty 1

## 2023-03-04 MED ORDER — DOXYCYCLINE HYCLATE 100 MG PO CAPS: 100.0000 mg | ORAL_CAPSULE | Freq: Two times a day (BID) | ORAL | 0 refills | Status: AC

## 2023-03-04 NOTE — ED Triage Notes (Signed)
Patient ambulatory to triage with steady gait, without difficulty or distress noted; pt reports pain and swelling around rt 3rd fingernail bed x 2 days

## 2023-03-04 NOTE — ED Provider Notes (Signed)
[facility] Provider Note    Event Date/Time   First MD Initiated Contact with Patient 03/04/23 [phone]     (approximate)   History   Hand Pain   HPI  Lawrence A Armad Dufour. is a 27 y.o. male who presents for evaluation of pain and swelling around the nail of his right middle finger.  He said that he picks at his nails but does not bite on them.  Is been swelling over the last few days but not draining.  The pain has become severe.  He can still flex and extend it although it hurts the end of his finger when he does so.  No fevers or chills.     Physical Exam   Triage Vital Signs: ED Triage Vitals [03/04/23 0115]  Enc Vitals Group     BP (!) 148/105     Pulse Rate 94     Resp 18     Temp 97.9 F (36.6 C)     Temp Source Oral     SpO2 97 %     Weight 85.7 kg (189 lb)     Height 1.753 m (5\' 9" )     Head Circumference      Peak Flow      Pain Score 5     Pain Loc      Pain Edu?      Excl. in [place]?     Most recent vital signs: Vitals:   03/04/23 0115  BP: (!) 148/105  Pulse: 94  Resp: 18  Temp: 97.9 F (36.6 C)  SpO2: 97%     General: Awake, no distress.  Generally well-appearing. CV:  Good peripheral perfusion.  Regular rate and rhythm. Resp:  Normal effort. Speaking easily and comfortably, no accessory muscle usage nor intercostal retractions.   Abd:  No distention.  Other:  Patient has a well-defined paronychia of the right middle finger with clearly visible purulence and fluctuance.  The distal phalanx of the finger is tight due to some swelling but there is no evidence of flexor tenosynovitis; only the distal phalanx is involved and mostly just the area around the nail with the obvious purulence.   ED Results / Procedures / Treatments   Labs (all labs ordered are listed, but only abnormal results are displayed) Labs Reviewed  AEROBIC/ANAEROBIC CULTURE W GRAM STAIN (SURGICAL/DEEP WOUND)     PROCEDURES:  Critical Care  performed: No  ..Incision and Drainage  Date/Time: 03/04/2023 4:00 AM  Performed by: Lawrence Kehr, MD Authorized by: Lawrence Kehr, MD   Consent:    Consent obtained:  Verbal   Consent given by:  Patient   Risks discussed:  Bleeding, infection, incomplete drainage and pain   Alternatives discussed:  Alternative treatment, delayed treatment and observation Location:    Type:  Abscess (Paronychia)   Location:  Upper extremity   Upper extremity location:  Finger   Finger location:  R long finger Anesthesia:    Anesthesia method:  Topical application   Topical anesthetic:  EMLA cream Procedure type:    Complexity:  Simple Procedure details:    Incision types:  Stab incision   Drainage:  Purulent and bloody   Drainage amount:  Copious   Wound treatment:  Wound left open   Packing materials:  None Post-procedure details:    Procedure completion:  Tolerated well, no immediate complications    MEDICATIONS ORDERED IN ED: Medications  lidocaine-prilocaine (EMLA) cream ( Topical Given 03/04/23 0357)  doxycycline (  VIBRA-TABS) tablet 100 mg (100 mg Oral Given 03/04/23 0502)     IMPRESSION / MDM / ASSESSMENT AND PLAN / ED COURSE  I reviewed the triage vital signs and the nursing notes.                              Differential diagnosis includes, but is not limited to, paronychia, felon, flexor tenosynovitis.  Patient's presentation is most consistent with acute, uncomplicated illness.  Labs/studies ordered: Wound culture Interventions/Medications given: Doxycycline, I&D [facility] Course my include additional interventions or labs/studies not listed above.)  Patient has a paronychia with no concerns for any more severe wound or infection.  Successful I&D with copious drainage.  Sent wound culture, started on empiric doxycycline.  While I was speaking with the patient I saw him picking at the nails of his other fingers and I advised him that that is most likely what caused the  current infection.  I gave him my usual and customary management recommendations and follow-up precautions.         FINAL CLINICAL IMPRESSION(S) / ED DIAGNOSES   Final diagnoses:  Paronychia of finger of right hand     Rx / DC Orders   ED Discharge Orders          Ordered    Ambulatory Referral to Primary Care (Establish Care)        03/04/23 0455    doxycycline (VIBRAMYCIN) 100 MG capsule  2 times daily        03/04/23 0457             Note:  This document was prepared using Dragon voice recognition software and may include unintentional dictation errors.   Lawrence Kehr, MD 03/04/23 [phone]

## 2023-03-09 LAB — AEROBIC/ANAEROBIC CULTURE W GRAM STAIN (SURGICAL/DEEP WOUND): Gram Stain: NONE SEEN

## 2023-03-21 ENCOUNTER — Other Ambulatory Visit: Payer: Self-pay

## 2023-03-21 ENCOUNTER — Emergency Department: Payer: Medicaid Other

## 2023-03-21 DIAGNOSIS — W19XXXA Unspecified fall, initial encounter: Secondary | ICD-10-CM | POA: Insufficient documentation

## 2023-03-21 DIAGNOSIS — S4991XA Unspecified injury of right shoulder and upper arm, initial encounter: Secondary | ICD-10-CM | POA: Insufficient documentation

## 2023-03-21 NOTE — ED Triage Notes (Addendum)
Pt presents to ER with c/o right arm injury that happened appx 2-3 days ago while he was camping.  Pt states he had some alcohol to drink and tripped and fell.  Pt has swelling and bruising noted to right arm.  States pain is mostly around his elbow, but swelling and bruising is seen around upper arm.  Pt unable to straighten arm all the way.  Right arm PMS noted distally.  Pt is otherwise A&O x4 and in NAD on arrival.

## 2023-03-22 ENCOUNTER — Emergency Department
Admission: EM | Admit: 2023-03-22 | Discharge: 2023-03-22 | Disposition: A | Discharge status: Home / Self Care | Payer: Medicaid Other | Attending: Emergency Medicine | Admitting: Emergency Medicine

## 2023-03-22 DIAGNOSIS — W19XXXA Unspecified fall, initial encounter: Secondary | ICD-10-CM

## 2023-03-22 DIAGNOSIS — S4991XA Unspecified injury of right shoulder and upper arm, initial encounter: Secondary | ICD-10-CM

## 2023-03-22 NOTE — ED Provider Notes (Signed)
[facility] Provider Note    Event Date/Time   First MD Initiated Contact with Patient 03/22/23 0114     (approximate)   History   Arm Injury   HPI  Lawrence Griffith. is a 27 y.o. male who presents to the ED for evaluation of Arm Injury   Patient presents to the ED for evaluation of right upper arm pain after a fall that occurred 2 days ago.  He reports he accidentally fell onto a cast iron fire pit, injuring his distal right upper arm, just proximal to the elbow, 2 days ago.  No head injury or syncope.  No other injuries.  Reports increasing swelling and pain at that site.  He is right-hand dominant.   Physical Exam   Triage Vital Signs: ED Triage Vitals [03/21/23 2311]  Enc Vitals Group     BP (!) 145/101     Pulse Rate 99     Resp 18     Temp 98 F (36.7 C)     Temp Source Oral     SpO2 100 %     Weight 190 lb (86.2 kg)     Height      Head Circumference      Peak Flow      Pain Score 8     Pain Loc      Pain Edu?      Excl. in GC?     Most recent vital signs: Vitals:   03/21/23 2311  BP: (!) 145/101  Pulse: 99  Resp: 18  Temp: 98 F (36.7 C)  SpO2: 100%    General: Awake, no distress.  CV:  Good peripheral perfusion.  Resp:  Normal effort.  Abd:  No distention.  MSK:  Close soft tissue deformity to the radial aspect of the distal right humerus, just proximal to the elbow.  Full passive and active range of motion of the elbow and shoulder, localized tenderness is present.  No laceration or evidence of open injury.  Distally neurovascularly intact.  No other signs of extremity trauma. Neuro:  No focal deficits appreciated. Cranial nerves II through XII intact 5/5 strength and sensation in all 4 extremities Other:     ED Results / Procedures / Treatments   Labs (all labs ordered are listed, but only abnormal results are displayed) Labs Reviewed - No data to display  EKG   RADIOLOGY Plain from the right elbow  interpreted by me without evidence of fracture or dislocation. Plain film the right humerus interpreted by me without evidence of fracture or dislocation  Official radiology report(s): DG Elbow Complete Right  Result Date: 03/21/2023 CLINICAL DATA:  Arm swelling EXAM: RIGHT ELBOW - COMPLETE 3+ VIEW COMPARISON:  None Available. FINDINGS: No fracture or malalignment. Soft tissue edema. No significant elbow effusion IMPRESSION: Soft tissue edema.  No acute osseous abnormality Electronically Signed   By: Jasmine Pang M.D.   On: 03/21/2023 23:56   DG Humerus Right  Result Date: 03/21/2023 CLINICAL DATA:  Arm swelling EXAM: RIGHT HUMERUS - 2+ VIEW COMPARISON:  None Available. FINDINGS: There is no evidence of fracture or other focal bone lesions. Edema posterior to the elbow. IMPRESSION: No acute osseous abnormality. Edema posterior to the elbow. Electronically Signed   By: Jasmine Pang M.D.   On: 03/21/2023 23:55    PROCEDURES and INTERVENTIONS:  Procedures  Medications - No data to display   IMPRESSION / MDM / ASSESSMENT AND PLAN / ED COURSE  I reviewed the triage vital signs and the nursing notes.  Differential diagnosis includes, but is not limited to, fracture, dislocation, hematoma, soft tissue strain, ligamentous injury  27 year old presents 2 days after a fall with evidence of soft tissue injury with negative x-rays and suitable for outpatient management.  I offered him a sling but he refused.   Clinical Course as of 03/22/23 0142  Tue Mar 22, 2023  0134 Patient believes that we never took x-rays of his arm so I pull up the x-rays and showed them to him.  He is uncertain if those are his bones and does not believe he ever took x-rays.  I offered for the fifth time to provide him with a sling for support and stability but he declines and he walks out of the ER without distress accompanied by security. [DS]    Clinical Course User Index [DS] Delton Prairie, MD     FINAL CLINICAL  IMPRESSION(S) / ED DIAGNOSES   Final diagnoses:  Fall, initial encounter  Injury of right upper extremity, initial encounter     Rx / DC Orders   ED Discharge Orders     None        Note:  This document was prepared using Dragon voice recognition software and may include unintentional dictation errors.   Delton Prairie, MD 03/22/23 [phone]

## 2023-03-22 NOTE — ED Notes (Signed)
Patient discharged by Dr. Katrinka Blazing.  Patient belligerent during discharge, and boisterous.  Patient was escorted out by security.  No vital signs were taken for discharge.  Patient was given his discharge paperwork.

## 2023-03-22 NOTE — ED Notes (Signed)
MD Katrinka Blazing at bedside with writer for DC. Pt adamant that extremity was not imaged despite being shown images by provider. Pt continues to be argumentative. Will not answer whether he would like sling or not. Continues to argue with provider over images. Security called to bedside. Pt presented DC instructions by Clinical research associate. Unable to obtain vitals as pt escorted out of dept by security.

## 2023-03-22 NOTE — Discharge Instructions (Signed)
Please take Tylenol and ibuprofen/Advil for your pain.  It is safe to take them together, or to alternate them every few hours.  Take up to 1000mg of Tylenol at a time, up to 4 times per day.  Do not take more than 4000 mg of Tylenol in 24 hours.  For ibuprofen, take 400-600 mg, 3 - 4 times per day.  

## 2023-04-23 ENCOUNTER — Emergency Department: Payer: Medicaid Other

## 2023-04-23 ENCOUNTER — Emergency Department
Admission: EM | Admit: 2023-04-23 | Discharge: 2023-04-23 | Disposition: A | Discharge status: Home / Self Care | Payer: Medicaid Other | Attending: Emergency Medicine | Admitting: Emergency Medicine

## 2023-04-23 DIAGNOSIS — F10920 Alcohol use, unspecified with intoxication, uncomplicated: Secondary | ICD-10-CM | POA: Diagnosis not present

## 2023-04-23 DIAGNOSIS — S01511A Laceration without foreign body of lip, initial encounter: Secondary | ICD-10-CM | POA: Diagnosis not present

## 2023-04-23 DIAGNOSIS — Z23 Encounter for immunization: Secondary | ICD-10-CM | POA: Insufficient documentation

## 2023-04-23 DIAGNOSIS — E876 Hypokalemia: Secondary | ICD-10-CM | POA: Diagnosis not present

## 2023-04-23 DIAGNOSIS — J45909 Unspecified asthma, uncomplicated: Secondary | ICD-10-CM | POA: Diagnosis not present

## 2023-04-23 DIAGNOSIS — Y908 Blood alcohol level of 240 mg/100 ml or more: Secondary | ICD-10-CM | POA: Diagnosis not present

## 2023-04-23 DIAGNOSIS — S0990XA Unspecified injury of head, initial encounter: Secondary | ICD-10-CM | POA: Diagnosis present

## 2023-04-23 DIAGNOSIS — S0083XA Contusion of other part of head, initial encounter: Secondary | ICD-10-CM

## 2023-04-23 LAB — COMPREHENSIVE METABOLIC PANEL
ALT: 264 U/L — ABNORMAL HIGH (ref 0–44)
AST: 392 U/L — ABNORMAL HIGH (ref 15–41)
Albumin: 4.6 g/dL (ref 3.5–5.0)
Alkaline Phosphatase: 67 U/L (ref 38–126)
Anion gap: 11 (ref 5–15)
BUN: 8 mg/dL (ref 6–20)
CO2: 24 mmol/L (ref 22–32)
Calcium: 8.8 mg/dL — ABNORMAL LOW (ref 8.9–10.3)
Chloride: 103 mmol/L (ref 98–111)
Creatinine, Ser: 0.73 mg/dL (ref 0.61–1.24)
GFR, Estimated: >60 mL/min (ref >60)
Glucose, Bld: 93 mg/dL (ref 70–99)
Potassium: 3.4 mmol/L — ABNORMAL LOW (ref 3.5–5.1)
Sodium: 138 mmol/L (ref 135–145)
Total Bilirubin: 1.3 mg/dL — ABNORMAL HIGH (ref 0.3–1.2)
Total Protein: 8.2 g/dL — ABNORMAL HIGH (ref 6.5–8.1)

## 2023-04-23 LAB — CBC
HCT: 46.2 % (ref 39.0–52.0)
Hemoglobin: 16.3 g/dL (ref 13.0–17.0)
MCH: 33.6 pg (ref 26.0–34.0)
MCHC: 35.3 g/dL (ref 30.0–36.0)
MCV: 95.3 fL (ref 80.0–100.0)
Platelets: 172 10*3/uL (ref 150–400)
RBC: 4.85 MIL/uL (ref 4.22–5.81)
RDW: 11.3 % — ABNORMAL LOW (ref 11.5–15.5)
WBC: 7.1 10*3/uL (ref 4.0–10.5)
nRBC: 0 % (ref 0.0–0.2)

## 2023-04-23 LAB — ETHANOL: Alcohol, Ethyl (B): 284 mg/dL — ABNORMAL HIGH (ref <10)

## 2023-04-23 MED ORDER — HYDROCODONE-ACETAMINOPHEN 5-325 MG PO TABS
1.0000 | ORAL_TABLET | Freq: Once | ORAL | Status: AC
  Administered 2023-04-23: 1 via ORAL
  Filled 2023-04-23: qty 1

## 2023-04-23 MED ORDER — SODIUM CHLORIDE 0.9 % IV BOLUS
1000.0000 mL | Freq: Once | INTRAVENOUS | Status: AC
  Administered 2023-04-23: 1000 mL via INTRAVENOUS

## 2023-04-23 MED ORDER — LIDOCAINE HCL (PF) 1 % IJ SOLN
5.0000 mL | Freq: Once | INTRAMUSCULAR | Status: AC
  Administered 2023-04-23: 5 mL
  Filled 2023-04-23: qty 5

## 2023-04-23 MED ORDER — TETANUS-DIPHTH-ACELL PERTUSSIS 5-2.5-18.5 LF-MCG/0.5 IM SUSY
0.5000 mL | PREFILLED_SYRINGE | Freq: Once | INTRAMUSCULAR | Status: AC
  Administered 2023-04-23: 0.5 mL via INTRAMUSCULAR
  Filled 2023-04-23: qty 0.5

## 2023-04-23 MED ORDER — IBUPROFEN 800 MG PO TABS: 800.0000 mg | ORAL_TABLET | Freq: Three times a day (TID) | ORAL | 0 refills | Status: DC | PRN
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
## 2023-04-23 NOTE — ED Notes (Signed)
Family at bedside. 

## 2023-04-23 NOTE — ED Notes (Signed)
Pt to CT
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
## 2023-04-23 NOTE — Discharge Instructions (Addendum)
Suture removal of blue suture on the outside of your bottom lip in 5 days.  The inside stitches will dissolve by themselves.  Avoid salty foods for the next week.  Return to the ER for worsening symptoms, persistent vomiting, lethargy or other concerns.

## 2023-07-26 ENCOUNTER — Emergency Department
Admission: EM | Admit: 2023-07-26 | Discharge: 2023-07-26 | Disposition: A | Discharge status: Home / Self Care | Payer: MEDICAID | Attending: Emergency Medicine | Admitting: Emergency Medicine

## 2023-07-26 ENCOUNTER — Emergency Department: Payer: MEDICAID

## 2023-07-26 ENCOUNTER — Other Ambulatory Visit: Payer: Self-pay

## 2023-07-26 DIAGNOSIS — M25511 Pain in right shoulder: Secondary | ICD-10-CM | POA: Insufficient documentation

## 2023-07-26 DIAGNOSIS — W1830XA Fall on same level, unspecified, initial encounter: Secondary | ICD-10-CM | POA: Insufficient documentation

## 2023-07-26 DIAGNOSIS — W19XXXA Unspecified fall, initial encounter: Secondary | ICD-10-CM

## 2023-07-26 MED ORDER — KETOROLAC TROMETHAMINE 60 MG/2ML IM SOLN
30.0000 mg | Freq: Once | INTRAMUSCULAR | Status: AC
  Administered 2023-07-26: 30 mg via INTRAMUSCULAR
  Filled 2023-07-26: qty 2

## 2023-07-26 NOTE — Discharge Instructions (Addendum)
You were seen in the emergency department today for a fall.  X-rays showed no acute fractures along your arm or clavicle.  CT imaging of your head was also reassuring at this time.  Please follow-up with your primary care provider for further assessment.  You can also follow-up with orthopedics if you have any ongoing right shoulder pain.  I recommend over-the-counter medications like ibuprofen and Tylenol to help with pain symptoms.

## 2023-07-26 NOTE — ED Provider Notes (Signed)
[facility] Provider Note    Event Date/Time   First MD Initiated Contact with Patient 07/26/23 [phone]     (approximate)   History   Shoulder Pain   HPI Dmitry Rutter. is a 27 y.o. male presenting for right shoulder injury s/p fall.  Patient is currently intoxicated (EtOH) and unable to provide significant history.  EMS reports that patient had a ground-level fall this morning with injury to his head and right shoulder.  When assessing where patient hurts, he points that his right shoulder, right clavicle, right elbow.  He does note that he did hit his head when he fell.  Denies injury anywhere else.     Physical Exam   Triage Vital Signs: ED Triage Vitals  Encounter Vitals Group     BP      Systolic BP Percentile      Diastolic BP Percentile      Pulse      Resp      Temp      Temp src      SpO2      Weight      Height      Head Circumference      Peak Flow      Pain Score      Pain Loc      Pain Education      Exclude from Growth Chart     Most recent vital signs: Vitals:   07/26/23 0736 07/26/23 0737  BP:  (!) 143/109  Pulse: 96 93  Resp: 18   Temp: 97.8 F (36.6 C)   SpO2: 96% 95%    I have reviewed the vital signs. General:  Awake, alert, no acute distress.  Intoxicated Head:  Normocephalic, Atraumatic. EENT:  PERRL, EOMI, Oral mucosa pink and moist, Neck is supple. Cardiovascular: Regular rate, 2+ distal radial pulses. Respiratory:  Normal respiratory effort, symmetrical expansion, no distress.   Extremities: Holding right arm up against body with elbow at 90 degrees.  No tenderness palpation over distal right clavicle and right shoulder.  Equivocal tenderness to palpation at right elbow.  No tenderness palpation throughout other extremities, chest wall, or back. Neuro:  Alert and oriented.  Interacting appropriately.  Sensation intact throughout all extremities. Skin:  Warm, dry, no rash.   Psych: Appropriate affect.      ED Results / Procedures / Treatments   Labs (all labs ordered are listed, but only abnormal results are displayed) Labs Reviewed - No data to display   EKG    RADIOLOGY See ED course for my independent interpretations   PROCEDURES:  Critical Care performed: No  Procedures   MEDICATIONS ORDERED IN ED: Medications  ketorolac (TORADOL) injection 30 mg (has no administration in time range)     IMPRESSION / MDM / ASSESSMENT AND PLAN / ED COURSE  I reviewed the triage vital signs and the nursing notes.                              Differential diagnosis includes, but is not limited to, proximal humerus fracture, right shoulder dislocation, right distal clavicle fracture, elbow fracture versus dislocation, ICH.  Patient's presentation is most consistent with acute presentation with potential threat to life or bodily function.  Patient is a 27 year old male presenting today with ground-level fall with injury to his right shoulder and head.  X-rays revealed no acute fractures or dislocations.  CT  imaging of the head was also reassuring with no intracranial abnormalities.  I suspect likely soft tissue injury to the right shoulder as a source of patient's pain.  There is potential for ligamentous or labral tears as well, and patient was given referral for outpatient orthopedics if symptoms persisted.  Patient otherwise monitored until more clinically sober and then safe for discharge.  He was given strict return precautions.  Clinical Course as of 07/26/23 0831  Tue Jul 26, 2023  0800 DG Chest Portable 1 View I independently viewed and interpreted chest x-ray showing broad distal clavicle which could indicate prior injury but no acute injury from this fall. [DW]  0800 DG Elbow Complete Right My interpretation: No acute fracture or dislocation [DW]  0801 DG Shoulder Right My interpretation: No acute fracture or dislocation [DW]  0807 CT Head Wo Contrast My interpretation:  No acute intracranial abnormalities [DW]    Clinical Course User Index [DW] Janith Lima, MD     FINAL CLINICAL IMPRESSION(S) / ED DIAGNOSES   Final diagnoses:  Acute pain of right shoulder  Fall, initial encounter     Rx / DC Orders   ED Discharge Orders          Ordered    Ambulatory Referral to Primary Care (Establish Care)        07/26/23 0830             Note:  This document was prepared using Dragon voice recognition software and may include unintentional dictation errors.   Janith Lima, MD 07/26/23 [phone]

## 2023-07-26 NOTE — ED Notes (Signed)
Pt up to bedside commode after using urinal. Pt assisted back to bed. Monitoring cords placed back on pt.

## 2023-07-26 NOTE — ED Notes (Signed)
Pt given hospital phone to call for ride
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
## 2023-07-26 NOTE — ED Notes (Signed)
Rn to bedside to discharge pt. Pt refused wheelchair. Pt ambulatory with mother to lobby.

## 2023-07-26 NOTE — ED Notes (Signed)
Pt transported to Xray. 

## 2023-07-26 NOTE — ED Notes (Signed)
Pt mother at bedside for transportation for discharge

## 2023-07-28 DIAGNOSIS — S4350XA Sprain of unspecified acromioclavicular joint, initial encounter: Secondary | ICD-10-CM | POA: Insufficient documentation

## 2023-09-29 ENCOUNTER — Other Ambulatory Visit: Payer: Self-pay

## 2023-09-29 ENCOUNTER — Emergency Department
Admission: EM | Admit: 2023-09-29 | Discharge: 2023-09-29 | Disposition: A | Discharge status: Home / Self Care | Payer: MEDICAID | Attending: Emergency Medicine | Admitting: Emergency Medicine

## 2023-09-29 DIAGNOSIS — J45909 Unspecified asthma, uncomplicated: Secondary | ICD-10-CM | POA: Diagnosis not present

## 2023-09-29 DIAGNOSIS — Y908 Blood alcohol level of 240 mg/100 ml or more: Secondary | ICD-10-CM | POA: Diagnosis not present

## 2023-09-29 DIAGNOSIS — F10129 Alcohol abuse with intoxication, unspecified: Secondary | ICD-10-CM | POA: Insufficient documentation

## 2023-09-29 DIAGNOSIS — F10920 Alcohol use, unspecified with intoxication, uncomplicated: Secondary | ICD-10-CM

## 2023-09-29 LAB — CBC WITH DIFFERENTIAL/PLATELET
Abs Immature Granulocytes: 0.01 10*3/uL (ref 0.00–0.07)
Basophils Absolute: 0.1 10*3/uL (ref 0.0–0.1)
Basophils Relative: 1 %
Eosinophils Absolute: 0.1 10*3/uL (ref 0.0–0.5)
Eosinophils Relative: 2 %
HCT: 48.5 % (ref 39.0–52.0)
Hemoglobin: 17.3 g/dL — ABNORMAL HIGH (ref 13.0–17.0)
Immature Granulocytes: 0 %
Lymphocytes Relative: 30 %
Lymphs Abs: 1.6 10*3/uL (ref 0.7–4.0)
MCH: 33 pg (ref 26.0–34.0)
MCHC: 35.7 g/dL (ref 30.0–36.0)
MCV: 92.6 fL (ref 80.0–100.0)
Monocytes Absolute: 0.3 10*3/uL (ref 0.1–1.0)
Monocytes Relative: 6 %
Neutro Abs: 3.3 10*3/uL (ref 1.7–7.7)
Neutrophils Relative %: 61 %
Platelets: 181 10*3/uL (ref 150–400)
RBC: 5.24 MIL/uL (ref 4.22–5.81)
RDW: 12.7 % (ref 11.5–15.5)
WBC: 5.4 10*3/uL (ref 4.0–10.5)
nRBC: 0 % (ref 0.0–0.2)

## 2023-09-29 LAB — CBC
HCT: 26.4 % — ABNORMAL LOW (ref 39.0–52.0)
Hemoglobin: 8.5 g/dL — ABNORMAL LOW (ref 13.0–17.0)
MCH: 33.7 pg (ref 26.0–34.0)
MCHC: 32.2 g/dL (ref 30.0–36.0)
MCV: 104.8 fL — ABNORMAL HIGH (ref 80.0–100.0)
Platelets: 231 10*3/uL (ref 150–400)
RBC: 2.52 MIL/uL — ABNORMAL LOW (ref 4.22–5.81)
RDW: 12.7 % (ref 11.5–15.5)
WBC: 3.5 10*3/uL — ABNORMAL LOW (ref 4.0–10.5)
nRBC: 0 % (ref 0.0–0.2)

## 2023-09-29 LAB — URINE DRUG SCREEN, QUALITATIVE (ARMC ONLY)
Amphetamines, Ur Screen: NOT DETECTED
Barbiturates, Ur Screen: NOT DETECTED
Benzodiazepine, Ur Scrn: NOT DETECTED
Cannabinoid 50 Ng, Ur ~~LOC~~: NOT DETECTED
Cocaine Metabolite,Ur ~~LOC~~: NOT DETECTED
MDMA (Ecstasy)Ur Screen: NOT DETECTED
Methadone Scn, Ur: NOT DETECTED
Opiate, Ur Screen: NOT DETECTED
Phencyclidine (PCP) Ur S: NOT DETECTED
Tricyclic, Ur Screen: NOT DETECTED

## 2023-09-29 LAB — ETHANOL: Alcohol, Ethyl (B): 467 mg/dL (ref <10)

## 2023-09-29 MED ORDER — NICOTINE 21 MG/24HR TD PT24
21.0000 mg | MEDICATED_PATCH | Freq: Every day | TRANSDERMAL | Status: DC
  Administered 2023-09-29: 21 mg via TRANSDERMAL
  Filled 2023-09-29: qty 1

## 2023-09-29 MED ORDER — LORAZEPAM 1 MG PO TABS
1.0000 mg | ORAL_TABLET | Freq: Once | ORAL | Status: AC
  Administered 2023-09-29: 1 mg via ORAL
  Filled 2023-09-29: qty 1
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
## 2023-09-29 NOTE — ED Notes (Addendum)
Patient mother Babette Relic called for an update on patient status [phone] informed mom unable to give her any information without patients consent and patient was unable to give consent at this time. (Patient was asleep and intoxicated.) Took her phone number and informed her would tell patient she called and he or I would give her an update at a later time.

## 2023-09-29 NOTE — ED Notes (Signed)
Patient making inappropriate comments to this RN and Byrd Hesselbach, NT when assisting patient to the bathroom to obtain urine sample.

## 2023-09-29 NOTE — ED Notes (Signed)
Patient wanting to dip offered to ask MD to get nicotine patch for patient to help decrease want for nicotine.

## 2023-09-29 NOTE — ED Notes (Signed)
Patient Belongings:  Anice Paganini Lighter

## 2023-09-29 NOTE — Discharge Instructions (Addendum)
Lease call the number provided to help abstain from alcohol going forward.  Do not drink alcohol and drive any motor vehicles.  Return to the emergency department for any symptom concerning to yourself.

## 2023-09-30 NOTE — ED Notes (Signed)
Patients belongings returned.

## 2023-09-30 NOTE — ED Provider Notes (Signed)
Patient metabolized, ambulatory, tolerating p.o. intake and suitable for outpatient management.   Delton Prairie, MD 09/30/23 [phone]

## 2023-10-01 NOTE — Plan of Care (Signed)
[facility] Tonsillectomy/Adenoidectomy, Postoperative PEDS care plan entered in error.

## 2023-11-16 ENCOUNTER — Inpatient Hospital Stay: Payer: MEDICAID

## 2023-11-16 ENCOUNTER — Emergency Department: Payer: MEDICAID

## 2023-11-16 ENCOUNTER — Encounter: Payer: Self-pay | Admitting: Family Medicine

## 2023-11-16 ENCOUNTER — Inpatient Hospital Stay
Admission: EM | Admit: 2023-11-16 | Discharge: 2023-11-18 | DRG: 871 | Disposition: A | Discharge status: Home / Self Care | Payer: MEDICAID | Attending: Internal Medicine | Admitting: Internal Medicine

## 2023-11-16 ENCOUNTER — Other Ambulatory Visit: Payer: Self-pay

## 2023-11-16 DIAGNOSIS — Z23 Encounter for immunization: Secondary | ICD-10-CM

## 2023-11-16 DIAGNOSIS — J329 Chronic sinusitis, unspecified: Secondary | ICD-10-CM | POA: Insufficient documentation

## 2023-11-16 DIAGNOSIS — B1920 Unspecified viral hepatitis C without hepatic coma: Secondary | ICD-10-CM | POA: Insufficient documentation

## 2023-11-16 DIAGNOSIS — E872 Acidosis, unspecified: Secondary | ICD-10-CM | POA: Diagnosis present

## 2023-11-16 DIAGNOSIS — J45909 Unspecified asthma, uncomplicated: Secondary | ICD-10-CM | POA: Diagnosis present

## 2023-11-16 DIAGNOSIS — R6520 Severe sepsis without septic shock: Secondary | ICD-10-CM

## 2023-11-16 DIAGNOSIS — J189 Pneumonia, unspecified organism: Secondary | ICD-10-CM | POA: Diagnosis present

## 2023-11-16 DIAGNOSIS — S60512A Abrasion of left hand, initial encounter: Secondary | ICD-10-CM | POA: Diagnosis present

## 2023-11-16 DIAGNOSIS — S0083XA Contusion of other part of head, initial encounter: Secondary | ICD-10-CM | POA: Diagnosis present

## 2023-11-16 DIAGNOSIS — F1910 Other psychoactive substance abuse, uncomplicated: Secondary | ICD-10-CM | POA: Insufficient documentation

## 2023-11-16 DIAGNOSIS — E876 Hypokalemia: Secondary | ICD-10-CM | POA: Diagnosis present

## 2023-11-16 DIAGNOSIS — R748 Abnormal levels of other serum enzymes: Secondary | ICD-10-CM | POA: Diagnosis present

## 2023-11-16 DIAGNOSIS — K7689 Other specified diseases of liver: Secondary | ICD-10-CM | POA: Diagnosis present

## 2023-11-16 DIAGNOSIS — E860 Dehydration: Secondary | ICD-10-CM | POA: Diagnosis present

## 2023-11-16 DIAGNOSIS — Z1152 Encounter for screening for COVID-19: Secondary | ICD-10-CM

## 2023-11-16 DIAGNOSIS — F17220 Nicotine dependence, chewing tobacco, uncomplicated: Secondary | ICD-10-CM | POA: Diagnosis present

## 2023-11-16 DIAGNOSIS — G9340 Encephalopathy, unspecified: Secondary | ICD-10-CM | POA: Insufficient documentation

## 2023-11-16 DIAGNOSIS — J188 Other pneumonia, unspecified organism: Secondary | ICD-10-CM | POA: Insufficient documentation

## 2023-11-16 DIAGNOSIS — Z791 Long term (current) use of non-steroidal anti-inflammatories (NSAID): Secondary | ICD-10-CM

## 2023-11-16 DIAGNOSIS — T40411A Poisoning by fentanyl or fentanyl analogs, accidental (unintentional), initial encounter: Secondary | ICD-10-CM | POA: Diagnosis present

## 2023-11-16 DIAGNOSIS — F1210 Cannabis abuse, uncomplicated: Secondary | ICD-10-CM | POA: Diagnosis present

## 2023-11-16 DIAGNOSIS — G9341 Metabolic encephalopathy: Secondary | ICD-10-CM

## 2023-11-16 DIAGNOSIS — T50904A Poisoning by unspecified drugs, medicaments and biological substances, undetermined, initial encounter: Secondary | ICD-10-CM | POA: Diagnosis not present

## 2023-11-16 DIAGNOSIS — F10129 Alcohol abuse with intoxication, unspecified: Secondary | ICD-10-CM | POA: Diagnosis present

## 2023-11-16 DIAGNOSIS — J0140 Acute pansinusitis, unspecified: Secondary | ICD-10-CM | POA: Diagnosis not present

## 2023-11-16 DIAGNOSIS — G928 Other toxic encephalopathy: Secondary | ICD-10-CM | POA: Diagnosis present

## 2023-11-16 DIAGNOSIS — Y908 Blood alcohol level of 240 mg/100 ml or more: Secondary | ICD-10-CM | POA: Diagnosis present

## 2023-11-16 DIAGNOSIS — Z881 Allergy status to other antibiotic agents status: Secondary | ICD-10-CM | POA: Diagnosis not present

## 2023-11-16 DIAGNOSIS — R7401 Elevation of levels of liver transaminase levels: Secondary | ICD-10-CM | POA: Insufficient documentation

## 2023-11-16 DIAGNOSIS — Z79899 Other long term (current) drug therapy: Secondary | ICD-10-CM

## 2023-11-16 DIAGNOSIS — F1410 Cocaine abuse, uncomplicated: Secondary | ICD-10-CM | POA: Diagnosis present

## 2023-11-16 DIAGNOSIS — A419 Sepsis, unspecified organism: Secondary | ICD-10-CM

## 2023-11-16 DIAGNOSIS — J9601 Acute respiratory failure with hypoxia: Secondary | ICD-10-CM

## 2023-11-16 DIAGNOSIS — Z882 Allergy status to sulfonamides status: Secondary | ICD-10-CM

## 2023-11-16 DIAGNOSIS — R7989 Other specified abnormal findings of blood chemistry: Secondary | ICD-10-CM | POA: Diagnosis present

## 2023-11-16 DIAGNOSIS — J690 Pneumonitis due to inhalation of food and vomit: Secondary | ICD-10-CM | POA: Diagnosis present

## 2023-11-16 DIAGNOSIS — J018 Other acute sinusitis: Secondary | ICD-10-CM | POA: Diagnosis not present

## 2023-11-16 DIAGNOSIS — Z880 Allergy status to penicillin: Secondary | ICD-10-CM | POA: Diagnosis not present

## 2023-11-16 HISTORY — DX: Severe sepsis without septic shock: A41.9

## 2023-11-16 HISTORY — DX: Chronic sinusitis, unspecified: J32.9

## 2023-11-16 HISTORY — DX: Acute respiratory failure with hypoxia: J96.01

## 2023-11-16 LAB — COMPREHENSIVE METABOLIC PANEL
ALT: 178 U/L — ABNORMAL HIGH (ref 0–44)
AST: 186 U/L — ABNORMAL HIGH (ref 15–41)
Albumin: 4.6 g/dL (ref 3.5–5.0)
Alkaline Phosphatase: 59 U/L (ref 38–126)
Anion gap: 12 (ref 5–15)
BUN: 7 mg/dL (ref 6–20)
CO2: 24 mmol/L (ref 22–32)
Calcium: 8.4 mg/dL — ABNORMAL LOW (ref 8.9–10.3)
Chloride: 101 mmol/L (ref 98–111)
Creatinine, Ser: 0.74 mg/dL (ref 0.61–1.24)
GFR, Estimated: >60 mL/min (ref >60)
Glucose, Bld: 166 mg/dL — ABNORMAL HIGH (ref 70–99)
Potassium: 4 mmol/L (ref 3.5–5.1)
Sodium: 137 mmol/L (ref 135–145)
Total Bilirubin: 0.9 mg/dL (ref <1.2)
Total Protein: 8.5 g/dL — ABNORMAL HIGH (ref 6.5–8.1)

## 2023-11-16 LAB — CBC WITH DIFFERENTIAL/PLATELET
Abs Immature Granulocytes: 0.07 10*3/uL (ref 0.00–0.07)
Basophils Absolute: 0.1 10*3/uL (ref 0.0–0.1)
Basophils Relative: 1 %
Eosinophils Absolute: 0.1 10*3/uL (ref 0.0–0.5)
Eosinophils Relative: 1 %
HCT: 49.3 % (ref 39.0–52.0)
Hemoglobin: 17.2 g/dL — ABNORMAL HIGH (ref 13.0–17.0)
Immature Granulocytes: 1 %
Lymphocytes Relative: 18 %
Lymphs Abs: 1.8 10*3/uL (ref 0.7–4.0)
MCH: 33.8 pg (ref 26.0–34.0)
MCHC: 34.9 g/dL (ref 30.0–36.0)
MCV: 96.9 fL (ref 80.0–100.0)
Monocytes Absolute: 0.5 10*3/uL (ref 0.1–1.0)
Monocytes Relative: 5 %
Neutro Abs: 7.6 10*3/uL (ref 1.7–7.7)
Neutrophils Relative %: 74 %
Platelets: 270 10*3/uL (ref 150–400)
RBC: 5.09 MIL/uL (ref 4.22–5.81)
RDW: 11.5 % (ref 11.5–15.5)
WBC: 10 10*3/uL (ref 4.0–10.5)
nRBC: 0 % (ref 0.0–0.2)

## 2023-11-16 LAB — URINE DRUG SCREEN, QUALITATIVE (ARMC ONLY)
Amphetamines, Ur Screen: NOT DETECTED
Barbiturates, Ur Screen: NOT DETECTED
Benzodiazepine, Ur Scrn: NOT DETECTED
Cannabinoid 50 Ng, Ur ~~LOC~~: POSITIVE — AB
Cocaine Metabolite,Ur ~~LOC~~: POSITIVE — AB
MDMA (Ecstasy)Ur Screen: NOT DETECTED
Methadone Scn, Ur: NOT DETECTED
Opiate, Ur Screen: NOT DETECTED
Phencyclidine (PCP) Ur S: NOT DETECTED
Tricyclic, Ur Screen: NOT DETECTED

## 2023-11-16 LAB — ETHANOL: Alcohol, Ethyl (B): 332 mg/dL (ref <10)

## 2023-11-16 LAB — SALICYLATE LEVEL: Salicylate Lvl: <7 mg/dL — ABNORMAL LOW (ref 7.0–30.0)

## 2023-11-16 LAB — MAGNESIUM: Magnesium: 2.3 mg/dL (ref 1.7–2.4)

## 2023-11-16 LAB — ACETAMINOPHEN LEVEL: Acetaminophen (Tylenol), Serum: <10 ug/mL — ABNORMAL LOW (ref 10–30)

## 2023-11-16 LAB — CBG MONITORING, ED: Glucose-Capillary: 130 mg/dL — ABNORMAL HIGH (ref 70–99)

## 2023-11-16 LAB — TROPONIN I (HIGH SENSITIVITY): Troponin I (High Sensitivity): 7 ng/L (ref <18)

## 2023-11-16 LAB — BRAIN NATRIURETIC PEPTIDE: B Natriuretic Peptide: 15 pg/mL (ref 0.0–100.0)

## 2023-11-16 LAB — LACTIC ACID, PLASMA
Lactic Acid, Venous: 2.8 mmol/L (ref 0.5–1.9)
Lactic Acid, Venous: 1.9 mmol/L (ref 0.5–1.9)
Lactic Acid, Venous: 2.7 mmol/L (ref 0.5–1.9)

## 2023-11-16 LAB — CORTISOL: Cortisol, Plasma: 33.3 ug/dL

## 2023-11-16 LAB — PROCALCITONIN: Procalcitonin: 0.24 ng/mL

## 2023-11-16 LAB — AMMONIA: Ammonia: 24 umol/L (ref 9–35)

## 2023-11-16 LAB — MRSA NEXT GEN BY PCR, NASAL: MRSA by PCR Next Gen: DETECTED — AB

## 2023-11-16 MED ORDER — TETANUS-DIPHTH-ACELL PERTUSSIS 5-2.5-18.5 LF-MCG/0.5 IM SUSY
0.5000 mL | PREFILLED_SYRINGE | Freq: Once | INTRAMUSCULAR | Status: AC
  Administered 2023-11-16: 0.5 mL via INTRAMUSCULAR
  Filled 2023-11-16: qty 0.5

## 2023-11-16 MED ORDER — LORAZEPAM 2 MG/ML IJ SOLN: 1.0000 mg | INTRAMUSCULAR | Status: DC | PRN

## 2023-11-16 MED ORDER — VANCOMYCIN HCL 2000 MG/400ML IV SOLN
2000.0000 mg | Freq: Once | INTRAVENOUS | Status: AC
  Administered 2023-11-16: 2000 mg via INTRAVENOUS
  Filled 2023-11-16: qty 400

## 2023-11-16 MED ORDER — PIPERACILLIN-TAZOBACTAM 3.375 G IVPB
3.3750 g | Freq: Three times a day (TID) | INTRAVENOUS | Status: DC
  Administered 2023-11-16 – 2023-11-17 (×4): 3.375 g via INTRAVENOUS
  Filled 2023-11-16 (×4): qty 50

## 2023-11-16 MED ORDER — FUROSEMIDE 10 MG/ML IJ SOLN
40.0000 mg | Freq: Once | INTRAMUSCULAR | Status: AC
  Administered 2023-11-16: 40 mg via INTRAVENOUS
  Filled 2023-11-16: qty 4

## 2023-11-16 MED ORDER — VANCOMYCIN HCL 1750 MG/350ML IV SOLN
1750.0000 mg | Freq: Two times a day (BID) | INTRAVENOUS | Status: DC
  Administered 2023-11-16 – 2023-11-17 (×3): 1750 mg via INTRAVENOUS
  Filled 2023-11-16 (×4): qty 350

## 2023-11-16 MED ORDER — SODIUM CHLORIDE 0.9 % IV BOLUS (SEPSIS)
1000.0000 mL | Freq: Once | INTRAVENOUS | Status: AC
  Administered 2023-11-16: 1000 mL via INTRAVENOUS

## 2023-11-16 MED ORDER — IBUPROFEN 400 MG PO TABS
600.0000 mg | ORAL_TABLET | Freq: Once | ORAL | Status: AC
  Administered 2023-11-17: 600 mg via ORAL
  Filled 2023-11-16: qty 2

## 2023-11-16 MED ORDER — LORAZEPAM 2 MG/ML IJ SOLN: 0.0000 mg | Freq: Two times a day (BID) | INTRAMUSCULAR | Status: DC

## 2023-11-16 MED ORDER — ENOXAPARIN SODIUM 40 MG/0.4ML IJ SOSY
40.0000 mg | PREFILLED_SYRINGE | INTRAMUSCULAR | Status: DC
  Administered 2023-11-16 – 2023-11-18 (×3): 40 mg via SUBCUTANEOUS
  Filled 2023-11-16 (×3): qty 0.4

## 2023-11-16 MED ORDER — SODIUM CHLORIDE 0.9 % IV SOLN
2.0000 g | Freq: Once | INTRAVENOUS | Status: AC
  Administered 2023-11-16: 2 g via INTRAVENOUS
  Filled 2023-11-16: qty 20

## 2023-11-16 MED ORDER — IOHEXOL 300 MG/ML  SOLN
100.0000 mL | Freq: Once | INTRAMUSCULAR | Status: AC | PRN
Start: 2023-11-16 — End: 2023-11-16
  Administered 2023-11-16: 100 mL via INTRAVENOUS

## 2023-11-16 MED ORDER — FOLIC ACID 1 MG PO TABS
1.0000 mg | ORAL_TABLET | Freq: Every day | ORAL | Status: DC
Start: 2023-11-16 — End: 2023-11-18
  Administered 2023-11-16 – 2023-11-18 (×3): 1 mg via ORAL
  Filled 2023-11-16 (×3): qty 1

## 2023-11-16 MED ORDER — LORAZEPAM 2 MG PO TABS: 0.0000 mg | ORAL_TABLET | Freq: Two times a day (BID) | ORAL | Status: DC

## 2023-11-16 MED ORDER — LACTATED RINGERS IV SOLN
150.0000 mL/h | INTRAVENOUS | Status: DC
  Administered 2023-11-16: 150 mL/h via INTRAVENOUS

## 2023-11-16 MED ORDER — METRONIDAZOLE 500 MG/100ML IV SOLN
500.0000 mg | Freq: Once | INTRAVENOUS | Status: AC
  Administered 2023-11-16: 500 mg via INTRAVENOUS
  Filled 2023-11-16: qty 100

## 2023-11-16 MED ORDER — ONDANSETRON HCL 4 MG PO TABS: 4.0000 mg | ORAL_TABLET | Freq: Four times a day (QID) | ORAL | Status: DC | PRN

## 2023-11-16 MED ORDER — THIAMINE HCL 100 MG/ML IJ SOLN
100.0000 mg | Freq: Once | INTRAMUSCULAR | Status: AC
  Administered 2023-11-16: 100 mg via INTRAVENOUS
  Filled 2023-11-16: qty 2

## 2023-11-16 MED ORDER — NALOXONE HCL 2 MG/2ML IJ SOSY: 0.4000 mg | PREFILLED_SYRINGE | INTRAMUSCULAR | Status: DC | PRN

## 2023-11-16 MED ORDER — LACTATED RINGERS IV SOLN: INTRAVENOUS | Status: DC

## 2023-11-16 MED ORDER — NALOXONE HCL 2 MG/2ML IJ SOSY
2.0000 mg | PREFILLED_SYRINGE | Freq: Once | INTRAMUSCULAR | Status: AC
  Administered 2023-11-16: 2 mg via INTRAVENOUS

## 2023-11-16 MED ORDER — ONDANSETRON HCL 4 MG/2ML IJ SOLN: 4.0000 mg | Freq: Four times a day (QID) | INTRAMUSCULAR | Status: DC | PRN

## 2023-11-16 MED ORDER — THIAMINE MONONITRATE 100 MG PO TABS
100.0000 mg | ORAL_TABLET | Freq: Every day | ORAL | Status: DC
Start: 2023-11-16 — End: 2023-11-18
  Administered 2023-11-16 – 2023-11-18 (×3): 100 mg via ORAL
  Filled 2023-11-16 (×3): qty 1

## 2023-11-16 MED ORDER — LORAZEPAM 2 MG/ML IJ SOLN
0.0000 mg | Freq: Four times a day (QID) | INTRAMUSCULAR | Status: DC
  Administered 2023-11-16: 1 mg via INTRAVENOUS
  Filled 2023-11-16: qty 1

## 2023-11-16 MED ORDER — LORAZEPAM 2 MG PO TABS
0.0000 mg | ORAL_TABLET | Freq: Four times a day (QID) | ORAL | Status: DC
  Administered 2023-11-16 (×2): 2 mg via ORAL
  Administered 2023-11-16 – 2023-11-17 (×2): 1 mg via ORAL
  Filled 2023-11-16 (×4): qty 1

## 2023-11-16 MED ORDER — THIAMINE HCL 100 MG/ML IJ SOLN: 100.0000 mg | Freq: Every day | INTRAMUSCULAR | Status: DC

## 2023-11-16 MED ORDER — ADULT MULTIVITAMIN W/MINERALS CH
1.0000 | ORAL_TABLET | Freq: Every day | ORAL | Status: DC
  Administered 2023-11-16 – 2023-11-18 (×3): 1 via ORAL
  Filled 2023-11-16 (×3): qty 1

## 2023-11-16 MED ORDER — LORAZEPAM 1 MG PO TABS: 1.0000 mg | ORAL_TABLET | ORAL | Status: DC | PRN

## 2023-11-16 NOTE — Assessment & Plan Note (Addendum)
Switched over to doxycycline and Omnicef for discharge.

## 2023-11-16 NOTE — ED Notes (Signed)
Assumed care of the patient from night shift. Patient was noted to be resting comfortably at this time. Patient room was cleaned up and mega mover was placed in the EMS closet. Dr. Alvester Morin visited the patient while this Paramedic was in the room. Patient was arousable but not for an extended period of time. Will make frequent checks on the patient.

## 2023-11-16 NOTE — Progress Notes (Signed)
       CROSS COVER NOTE  NAME: Lawrence Griffith. MRN: [id number] DOB : 13-Aug-1996    Concern as stated by nurse / staff   Message received from [facility] RN  pts lactic acid is 2.4     Pertinent findings on chart review: Patient found down, apparent overdose. Initial workup showed patietn with finding on chest imaging consistant with fluid overload or aspiration, sepsis, sinusitis, transaminitis, poor dentician and acute resp failure. Lactic acidosis ongoing. On vancomycin and zosyn for broad coverage. IV fluids ordered and a dose of lasix was given this afternoon. No leukocytosis present. No procal or CK results  Assessment and  Interventions   Assessment:    11/16/2023    9:41 PM 11/16/2023    9:38 PM 11/16/2023    9:00 PM  Vitals with BMI  Weight 193 lbs 3 oz    Systolic  124 136  Diastolic  83 83  Pulse  111 114    Plan:  Continue IV fluids as previously ordered Procal - 3.2 Lactic acid trend - no normal 1.1, likely more metabolic component with liver dysfunction WBC now 17 K supplemented 40 meQ oral tab       Donnie Mesa NP [facility] Cross Cover 7pm-7am - check amion for availability Pager [phone]

## 2023-11-16 NOTE — Assessment & Plan Note (Deleted)
Positive generalized lethargy in the setting of polysubstance abuse and overdose Found unresponsive at home s/p ativan  UDS today positive for cocaine, marijuana Noted fentanyl use status post Narcan Aspiration pneumonia on CT scan as well as overlapping sinusitis IV fluid hydration IV Zosyn for sinusitis and aspiration pneumonia coverage Withdrawal protocol Monitor

## 2023-11-16 NOTE — TOC Initial Note (Addendum)
Transition of Care (TOC) - Initial/Assessment Note    Patient Details  Name: Lawrence Griffith. MRN: [id number] Date of Birth: [birth year]  [facility]) CM/SW Contact:    Marquita Palms, LCSW Phone Number: 11/16/2023, 11:03 AM  Clinical Narrative:                  CSW acknowledges TOC consult for SA. CSW went to patient's room for consult. Patient does not have PCP on record. CSW will leave application for assistance with medication and PCP assistance. Patient resting, patient was groggy and would not wake up to speak with CSW. CSW attached substance abuse resources to AVS. No other needs at this time.       Patient Goals and CMS Choice            Expected Discharge Plan and Services                                              Prior Living Arrangements/Services                       Activities of Daily Living      Permission Sought/Granted                  Emotional Assessment              Admission diagnosis:  Acute respiratory failure with hypoxia (HCC) [J96.01] Patient Active Problem List   Diagnosis Date Noted   Acute respiratory failure with hypoxia (HCC) 11/16/2023   Acute encephalopathy 11/16/2023   Sepsis (HCC) 11/16/2023   Transaminitis 11/16/2023   Aspiration pneumonia (HCC) 11/16/2023   Sinusitis 11/16/2023   PCP:  Oneita Hurt No Pharmacy:   [facility] [address], [place] - [address] [place] [address] Phone: [phone] Fax: [phone]     Social Determinants of Health (SDOH) Social History: SDOH Screenings   Tobacco Use: High Risk (11/16/2023)   SDOH Interventions:     Readmission Risk Interventions     No data to display
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
## 2023-11-16 NOTE — Progress Notes (Signed)
Pharmacy Antibiotic Note  Lawrence Griffith. is a 27 y.o. male admitted on 11/16/2023 with sepsis.  Pharmacy has been consulted for zosyn and vancomycin dosing.  Patient given ceftriaxone 2 g IV x 1 and metronidazole 500 mg IV x 1  Tmax 97.4 WBC 10 Imaging: Widespread bilateral pulmonary ground-glass opacity, with patchy solid peribronchial opacity in the right upper lobe. In this setting the top differential considerations are aspiration and noncardiogenic pulmonary edema. No pleural effusion or pneumothorax.  Plan: Day 1 of antibiotics Give vancomycin 2000 mg IV x1 followed by 1750 mg IV Q12H. Goal AUC 400-550. Expected AUC: 493.2 Expected Css min: 11.8 SCr used: 0.8  Weight used: IBW, Vd used: 0.72 (BMI 27.32) Start Zosyn 3.375 g IV Q8H Continue to monitor renal function and follow culture results      Temp (24hrs), Avg:97.3 F (36.3 C), Min:97.1 F (36.2 C), Max:97.4 F (36.3 C)  Recent Labs  Lab 11/16/23 0218  WBC 10.0  CREATININE 0.74    CrCl cannot be calculated (Unknown ideal weight.).    Allergies  Allergen Reactions   Clarithromycin     Other reaction(s): HIVES   Penicillamine Hives   Penicillins Hives    Has patient had a PCN reaction causing immediate rash, facial/tongue/throat swelling, SOB or lightheadedness with hypotension: Yes Has patient had a PCN reaction causing severe rash involving mucus membranes or skin necrosis: No Has patient had a PCN reaction that required hospitalization No Has patient had a PCN reaction occurring within the last 10 years: Yes If all of the above answers are "NO", then may proceed with Cephalosporin use.   Sulfa Antibiotics     Other reaction(s): HIVES    Antimicrobials this admission: 12/4 Ceftriaxone x 1 12/4 Metronidazole x 1 12/4 Vancomycin>> 12/4 Zosyn>>  Dose adjustments this admission: None  Microbiology results: None  Thank you for allowing pharmacy to be a part of this patient's care.  Merryl Hacker, PharmD Clinical Pharmacist  11/16/2023 7:33 AM

## 2023-11-16 NOTE — ED Notes (Signed)
CCMD contacted by this RN to confirm pt is on cardiac monitoring.CCMD confirmed they are monitoring pt.

## 2023-11-16 NOTE — ED Notes (Signed)
Pt continuing to sleep at this time. VSS. Chest rise and fall noted. Pt remains on monitor

## 2023-11-16 NOTE — Discharge Instructions (Addendum)
No Driving   Intensive Outpatient Programs   [facility] Health [facility] [address] [place],  [place], [place] [phone]  [facility] (Inpatient and outpatient)[phone] (Suboxone and Methadone) [address] [phone]  ADS: Alcohol & Drug [facility] Programs - Intensive Outpatient [address] [place], [place] [address], [place]  [phone]  [facility] Margo Aye (Outpatient, Inpatient, [facility] (Groups and Residental) (insurance only) [phone] [place], [place] [phone]   [facility] (for caregivers and family) [address] Laurell Josephs [address], [place], [place] [phone]  Residential Treatment Programs  [facility](2 years) Residential: 2 days)[facility] [address] [place], [place], [place] [phone] or [phone]  [facility] [address] [place], [place], [place] [phone]  [facility] ([facility] [address] [place], [place], [place] [phone] Admissions: 8am-3pm M-F  [facility] Program: Residential Program [phone] Days)             [facility]: [facility]  [place], [place], [place]  [phone] or [phone] in Hours over the weekend or by referral)  [facility] [address] [place], [place] [address] [phone] (Do virtual or phone assessment, offer transportation within 25 miles, have in patient and Outpatient options)   Mobil Crisis: [facility]:[phone]  (for crisis response 24 hours a day)      Rent/Utility/Housing  Agency Name: [facility] Address: [address] [place], [place] [address] Phone: [phone] Email: [email or web address] Website: www.[email or web address].org Service(s) Offered: Housing services, self-sufficiency, congregate meal program, weatherization program, Field seismologist program, emergency food assistance,  housing counseling, home ownership program, wheels -towork program.  Agency Name: [facility] Address: [address], [place], [place] [address] Phone: [phone] (8a-4p) [phone] (8p- 10p) Email: [email or web address] Website: [email or web address] Service(s) Offered: A program for homeless and/or needy men that includes one-on-one counseling, life skills training and job rehabilitation.  Agency Name: [employer] [facility] Address: [address] [place], [place] [address] Phone: [phone] Website: www.alliedchurches.org Service(s) Offered: Assistance to needy in emergency with utility bills, heating fuel, and prescriptions. Shelter for homeless 7pm-7am. April 07, 2017 15  Agency Name: [facility] [place] (Developmentally Disabled) Address: [address], [place], [place] [address] Phone: [phone] Contact Person: Cathleen Corti Email: [email or web address] Website: [email or web address] Service(s) Offered: Helps individuals with developmental disabilities move from housing that is more restrictive to homes where they  can achieve greater independence and have more  opportunities.  Agency Name: [facility] Address: [address], [place], [place] [address] Phone: [phone] Email: [email or web address] Website: www.[email or web address].org Service(s) Offered: Provides affordable housing for low-income families, elderly, and disabled individuals. Offer a wide range of  programs  and services, from financial planning to afterschool and summer programs.  Agency Name: [facility] [employer] Address: [address] [place], [place] [address] Phone: [phone] Service(s) Offered: Child support services; child welfare services; food stamps; Medicaid; work first family assistance; and aid with fuel,  rent, food and medicine.  Agency Name: [facility] [place] [employer]. Address: [facility] [address] [place], [place]  [address] Phone: [phone] Website: www.familyabuseservices.org Service(s) Offered: 24 hour Crisis Line: [phone]; 24 hour Emergency Shelter; Transitional Housing; Support Groups; Scientist, physiological; [employer]; Hispanic  Outreach: [phone];  Visitation Center: [phone].  Agency Name: [facility] [place], [place]. Address: [address] [place], [place] [address] Phone: [phone] Service(s) Offered: CAP Services; Home and [employer]; Individual or Group Supports; Respite Care Non-Institutional Nursing;  Residential Supports; Respite Care and Personal Care Services; Transportation; Family and Friends Night; Recreational Activities; Three Nutritious Meals/Snacks; Consultation with Registered Dietician; Twenty-four hour Registered Nurse Access; Daily and [employer]; Camp [employer]aves; Bryant for the [employer] (During Summer Months) Bingo Night (Every  Wednesday Night); Special Populations Dance Night  (Every Tuesday Night); Professional Hair Care Services.  Agency Name: [facility] Address: [address], [place], [place] [address] Phone: [phone] Contact Person: Jabier Mutton Website: [email or web address].Physicist, medical) Offered: Residential treatment facility for women; food and  clothing, educational & employment development and  transportation to work; Counsellor of financial skills;  parenting and family  reunification; emotional and spiritual  support; transitional housing for program graduates.  Agency Name: [facility] Address: [address] [place], [place] [address] Phone: [phone] Email: [email or web address] Website: [email or web address].es Service(s) Offered: Public housing units for elderly, disabled, and low income people; housing choice vouchers for income eligible  applicants; shelter plus care vouchers; and Psychologist, clinical.  Agency Name: [facility] [employer] Address: [address], [place], [place] [address] Phone: [phone] Email: [email or web address] Website: www.habitatalamance.org Service(s) Offered: Build houses for families in need of decent housing. Each adult in the family must invest 200 hours of labor on  someone else's house, work with volunteers to build their own house, attend classes on budgeting, home maintenance, yard care, and attend homeowner association meetings.  Agency Name: [facility] Address: [address] [place], [place] [address] Phone: [phone] Website: www.rsli.org Service(s) Offered: Intermediate care facilities for intellectually delayed, Supervised Living in group homes for adults with developmental disabilities, Supervised Living for people who have dual diagnoses (MRMI), Independent Living, Supported Living, respite and a variety of CAP services, pre-vocational services, day supports, and [employer].  Agency Name: [place] [employer] Phone: [phone] Website: www.NCForeclosurePrevention.gov Service(s) Offered: Zero-interest, deferred loans to homeowners struggling to pay their mortgage. Call for more information.

## 2023-11-16 NOTE — ED Notes (Signed)
Brought in by medic from home for drug overdose. Patient lying on floor decreased respirations 2mg  narcan given, another 2mg  of narcan given en route. BP 147/84 hr 120. No complaints of pain. Patient responding appropriately. 4mg  Zofran given en route for nausea. Blood glucose 160. Patient does not recall what he did.

## 2023-11-16 NOTE — ED Notes (Signed)
Pt sleeping at this time. Chest rise and fall noted. Pt is on monitor. Vital signs are stable

## 2023-11-16 NOTE — Assessment & Plan Note (Signed)
Decompensated respiratory failure requiring 3 to 4 L nasal cannula in the setting of toxic metabolic encephalopathy with associated drug overdose, sepsis Positive aspiration pneumonia CT of the chest today IV Zosyn for respiratory coverage Resp culture  Follow

## 2023-11-16 NOTE — H&P (Addendum)
History and Physical    Patient: Lawrence Griffith. ZOX:[id number] DOB: Oct 11, 1996 DOA: 11/16/2023 DOS: the patient was seen and examined on 11/16/2023 PCP: Pcp, No  Patient coming from: Home  Chief Complaint:  Chief Complaint  Patient presents with   Drug Overdose   HPI: Lawrence Griffith. is a 27 y.o. male with medical history significant of substance abuse, tobacco abuse presenting with encephalopathy, acute respiratory failure with hypoxia, sepsis, transaminitis.  Limited history in the setting of encephalopathy.  Per report, patient found unresponsive at home.  Was reported to have taken illicit fentanyl.  EMS contacted with patient receiving Narcan.  Positive improvement in mentation after Narcan.  Also given Zofran around because of nausea.  Blood sugar in 160s.  Patient otherwise lethargic. Presents to the ER afebrile, heart rate 100s, respirations upper 20s.  Initially placed on nonrebreather.  Then transition to 3 to 4 L nasal cannula to keep O2 sats greater than 93%.  White count 10, hemoglobin 17.2, platelets 270.  Creatinine 0.74, AST 186, ALT 178.  Tylenol and salicylate level within normal notes.  Troponin within norm limits.  Urine drug screen positive for cocaine and cannabinoids.  Ammonia level within norm limits.  CT imaging with no aspiration pneumonia as well as sinusitis. Review of Systems: As mentioned in the history of present illness. All other systems reviewed and are negative. Past Medical History:  Diagnosis Date   Asthma    Past Surgical History:  Procedure Laterality Date   ORIF TIBIA PLATEAU Left 05/13/2020   Procedure: OPEN REDUCTION INTERNAL FIXATION (ORIF) OF LEFT LATERAL TIBIAL PLATEAU FRACTURE;  Surgeon: Signa Kell, MD;  Location: [facility] ORS;  Service: Orthopedics;  Laterality: Left;   Social History:  reports that he has never smoked. His smokeless tobacco use includes chew. He reports current alcohol use of about 6.0 standard drinks of alcohol per  week. He reports current drug use. Frequency: 7.00 times per week. Drug: Marijuana.  Allergies  Allergen Reactions   Clarithromycin     Other reaction(s): HIVES   Penicillamine Hives   Penicillins Hives    Has patient had a PCN reaction causing immediate rash, facial/tongue/throat swelling, SOB or lightheadedness with hypotension: Yes Has patient had a PCN reaction causing severe rash involving mucus membranes or skin necrosis: No Has patient had a PCN reaction that required hospitalization No Has patient had a PCN reaction occurring within the last 10 years: Yes If all of the above answers are "NO", then may proceed with Cephalosporin use.   Sulfa Antibiotics     Other reaction(s): HIVES    History reviewed. No pertinent family history.  Prior to Admission medications   Medication Sig Start Date End Date Taking? Authorizing Provider  acetaminophen (TYLENOL) 500 MG tablet Take 500 mg by mouth every 6 (six) hours as needed for mild pain or moderate pain. Patient not taking: Reported on 11/16/2023    [provider]  cephALEXin (KEFLEX) 500 MG capsule Take 2 capsules (1,000 mg total) by mouth 2 (two) times daily. Patient not taking: Reported on 11/16/2023 05/01/21   Cuthriell, Delorise Royals, PA-C  ibuprofen (ADVIL) 800 MG tablet Take 1 tablet (800 mg total) by mouth every 8 (eight) hours as needed for moderate pain. Patient not taking: Reported on 11/16/2023 04/23/23   [name], MD  meloxicam (MOBIC) 15 MG tablet Take 1 tablet (15 mg total) by mouth daily. Patient not taking: Reported on 11/16/2023 05/01/21   Cuthriell, Delorise Royals, PA-C  methocarbamol (ROBAXIN) 500 MG tablet Take 1 tablet (500 mg total) by mouth 4 (four) times daily. Patient not taking: Reported on 11/16/2023 05/01/21   Cuthriell, Delorise Royals, PA-C  ondansetron (ZOFRAN ODT) 4 MG disintegrating tablet Take 1 tablet (4 mg total) by mouth every 8 (eight) hours as needed for nausea or vomiting. Patient not taking: Reported  on 11/16/2023 05/13/20   Signa Kell, MD    Physical Exam: Vitals:   11/16/23 0608 11/16/23 0615 11/16/23 0630 11/16/23 0645  BP:  114/74 110/71 99/67  Pulse:  (!) 120 (!) 114 (!) 107  Resp:      Temp:      TempSrc:      SpO2: 98% 98% 95%    Physical Exam Constitutional:      Appearance: He is normal weight.  HENT:     Head: Normocephalic and atraumatic.     Mouth/Throat:     Mouth: Mucous membranes are moist.  Eyes:     Pupils: Pupils are equal, round, and reactive to light.  Cardiovascular:     Rate and Rhythm: Normal rate and regular rhythm.  Pulmonary:     Effort: Pulmonary effort is normal.     Breath sounds: Rales present.  Abdominal:     General: Bowel sounds are normal.  Musculoskeletal:     Comments: + generalized weakness    Neurological:     Comments: + lethargy  Otherwise grossly nonfocal neuro exam    Psychiatric:        Mood and Affect: Mood normal.     Data Reviewed:  There are no new results to review at this time.  CT CHEST ABDOMEN PELVIS W CONTRAST CLINICAL DATA:  27 year old male found with suspected drug overdose.  EXAM: CT CHEST, ABDOMEN, AND PELVIS WITH CONTRAST  TECHNIQUE: Multidetector CT imaging of the chest, abdomen and pelvis was performed following the standard protocol during bolus administration of intravenous contrast.  RADIATION DOSE REDUCTION: This exam was performed according to the departmental dose-optimization program which includes automated exposure control, adjustment of the mA and/or kV according to patient size and/or use of iterative reconstruction technique.  CONTRAST:  OMNIPAQUE IOHEXOL 300 MG/ML  SOLN  COMPARISON:  CT Abdomen and Pelvis 06/28/2016. Portable chest 0231 hours today.  FINDINGS: CT CHEST FINDINGS  Cardiovascular: Cardiac pulsation artifact. Major mediastinal vascular structures appear intact. No atherosclerosis identified. Heart size is at the upper limits of normal. No  pericardial effusion.  Mediastinum/Nodes: Negative. No mediastinal mass, hematoma, lymphadenopathy. Small gastric hiatal hernia versus phrenic ampulla (normal variant).  Lungs/Pleura: Scattered bilateral pulmonary ground-glass opacity, peribronchial and only the right middle lobe is affected. Superimposed patchy solid peribronchial opacity in the right lung apex, the posterior right upper lobe. Some atelectatic changes to the major airways which are otherwise patent. No pleural effusion. No pneumothorax.  Musculoskeletal: No acute osseous abnormality identified. Small anterior superior chest wall probable spacious cyst series 2, image 16 (no follow-up imaging recommended).  CT ABDOMEN PELVIS FINDINGS  Hepatobiliary: Borderline hepatic steatosis.  Negative gallbladder.  Pancreas: Negative.  Spleen: Negative.  Adrenals/Urinary Tract: Normal adrenal glands. Symmetric renal enhancement. Kidneys appear nonobstructed, no delayed renal images. Urinary bladder is distended (sagittal image 62), estimated volume 1100 mL. But otherwise unremarkable.  Stomach/Bowel: Mild large bowel redundancy and retained stool. Normal retrocecal appendix on series 2, image 92. No dilated small bowel, free air or free fluid. Negative stomach and duodenum.  Vascular/Lymphatic: Major arterial structures in the portal venous system in the abdomen  and pelvis appear patent and normal. No lymphadenopathy.  Reproductive: Negative.  Other: No pelvis free fluid.  Musculoskeletal: No acute osseous abnormality identified.  IMPRESSION: 1. Widespread bilateral pulmonary ground-glass opacity, with patchy solid peribronchial opacity in the right upper lobe. In this setting the top differential considerations are aspiration and noncardiogenic pulmonary edema. No pleural effusion or pneumothorax.  2. No other acute or inflammatory finding identified in the chest, abdomen, or pelvis; distended urinary bladder  (1,100 mL).  Electronically Signed   By: Odessa Fleming M.D.   On: 11/16/2023 04:34 CT MAXILLOFACIAL WO CONTRAST CLINICAL DATA:  Drug overdose.  Found down.  EXAM: CT MAXILLOFACIAL WITHOUT CONTRAST  TECHNIQUE: Multidetector CT imaging of the maxillofacial structures was performed. Multiplanar CT image reconstructions were also generated.  RADIATION DOSE REDUCTION: This exam was performed according to the departmental dose-optimization program which includes automated exposure control, adjustment of the mA and/or kV according to patient size and/or use of iterative reconstruction technique.  COMPARISON:  None Available.  FINDINGS: Osseous: No fracture or mandibular dislocation. Poor dentition with multiple caries and periapical lucencies, greatest at the left mandibular third molar.  Orbits: Negative. No traumatic or inflammatory finding.  Sinuses: Moderate bilateral maxillary sinus mucosal thickening.  Soft tissues: Negative.  Limited intracranial: Normal  IMPRESSION: 1. No acute facial bone fracture. 2. Poor dentition with multiple caries and periapical lucencies, greatest at the left mandibular third molar. 3. Bilateral maxillary odontogenic sinusitis.  Electronically Signed   By: Deatra Robinson M.D.   On: 11/16/2023 03:50 CT CERVICAL SPINE WO CONTRAST CLINICAL DATA:  Drug overdose.  Patient found lying on the floor.  EXAM: CT CERVICAL SPINE WITHOUT CONTRAST  TECHNIQUE: Multidetector CT imaging of the cervical spine was performed without intravenous contrast. Multiplanar CT image reconstructions were also generated.  RADIATION DOSE REDUCTION: This exam was performed according to the departmental dose-optimization program which includes automated exposure control, adjustment of the mA and/or kV according to patient size and/or use of iterative reconstruction technique.  COMPARISON:  Apr 23, 2023  FINDINGS: Alignment: There is reversal of the normal cervical  spine lordosis.  Skull base and vertebrae: No acute fracture. No primary bone lesion or focal pathologic process.  Soft tissues and spinal canal: No prevertebral fluid or swelling. No visible canal hematoma.  Disc levels: Stable, moderate severity posterior bony spurring is seen at the level of C3-C4 with normal endplates present throughout the remainder of the cervical spine.  Normal intervertebral disc spaces are noted throughout the cervical spine.  Normal, bilateral multilevel facet joints are noted.  Upper chest: Marked severity airspace disease is seen within the visualized portion of the right upper lobe.  Other: None.  IMPRESSION: 1. No acute fracture or subluxation of the cervical spine. 2. Marked severity right upper lobe airspace disease.  Electronically Signed   By: Aram Candela M.D.   On: 11/16/2023 03:46 CT HEAD WO CONTRAST CLINICAL DATA:  Drug overdose.  Patient found lying on the floor.  EXAM: CT HEAD WITHOUT CONTRAST  TECHNIQUE: Contiguous axial images were obtained from the base of the skull through the vertex without intravenous contrast.  RADIATION DOSE REDUCTION: This exam was performed according to the departmental dose-optimization program which includes automated exposure control, adjustment of the mA and/or kV according to patient size and/or use of iterative reconstruction technique.  COMPARISON:  July 26, 2023  FINDINGS: Brain: No evidence of acute infarction, hemorrhage, hydrocephalus, extra-axial collection or mass lesion/mass effect.  Vascular: No hyperdense vessel or unexpected  calcification.  Skull: A nondisplaced left-sided nasal bone fracture of indeterminate age is noted.  Sinuses/Orbits: There is moderate severity bilateral maxillary sinus and marked severity bilateral ethmoid sinus mucosal thickening.  Other: Mild, stable left occipital scalp soft tissue swelling is noted. Stable left parietal scalp calcifications  are also seen.  IMPRESSION: 1. No acute intracranial abnormality. 2. Nondisplaced left-sided nasal bone fracture of indeterminate age. 3. Moderate severity bilateral maxillary sinus and marked severity bilateral ethmoid sinus disease.  Electronically Signed   By: Aram Candela M.D.   On: 11/16/2023 03:44 DG Clavicle Right CLINICAL DATA:  Drug overdose.  EXAM: RIGHT CLAVICLE - 2+ VIEWS  COMPARISON:  None Available.  FINDINGS: There is no evidence of an acute fracture or dislocation. A chronic appearing deformity is seen involving the distal aspect of the right clavicle. Soft tissues are unremarkable.  IMPRESSION: Chronic appearing deformity of the distal right clavicle without an acute osseous abnormality.  Electronically Signed   By: Aram Candela M.D.   On: 11/16/2023 03:08 DG Hand Complete Left CLINICAL DATA:  Drug overdose, found on the floor.  EXAM: LEFT HAND - COMPLETE 3+ VIEW  COMPARISON:  None Available.  FINDINGS: There is no evidence of acute fracture or dislocation. A chronic deformity is seen involving the distal left radius and adjacent carpal bones. Soft tissues are unremarkable.  IMPRESSION: Chronic deformity of the distal left radius and adjacent carpal bones.  Electronically Signed   By: Aram Candela M.D.   On: 11/16/2023 03:07 DG Chest Port 1 View CLINICAL DATA:  Drug overdose.  EXAM: PORTABLE CHEST 1 VIEW  COMPARISON:  July 26, 2023  FINDINGS: The heart size and mediastinal contours are within normal limits. Low lung volumes are noted. Very mild atelectasis is seen within the left lung base without evidence of acute infiltrate, pleural effusion or pneumothorax. The visualized skeletal structures are unremarkable.  IMPRESSION: No active disease.  Electronically Signed   By: Aram Candela M.D.   On: 11/16/2023 03:06  Lab Results  Component Value Date   WBC 10.0 11/16/2023   HGB 17.2 (H) 11/16/2023   HCT  49.3 11/16/2023   MCV 96.9 11/16/2023   PLT 270 11/16/2023   Last metabolic panel Lab Results  Component Value Date   GLUCOSE 166 (H) 11/16/2023   NA 137 11/16/2023   K 4.0 11/16/2023   CL 101 11/16/2023   CO2 24 11/16/2023   BUN 7 11/16/2023   CREATININE 0.74 11/16/2023   GFRNONAA >60 11/16/2023   CALCIUM 8.4 (L) 11/16/2023   PROT 8.5 (H) 11/16/2023   ALBUMIN 4.6 11/16/2023   BILITOT 0.9 11/16/2023   ALKPHOS 59 11/16/2023   AST 186 (H) 11/16/2023   ALT 178 (H) 11/16/2023   ANIONGAP 12 11/16/2023    Assessment and Plan: * Acute respiratory failure with hypoxia (HCC) Decompensated respiratory failure in setting of toxic metabolic encephalopathy and substance abuse CT imaging concerning for aspiration pneumonia versus noncardiogenic pulm edema Placed on IV Zosyn for aspiration coverage Blood and respiratory cultures Monitor   Sinusitis + sinusitis on imaging in setting of encephalopathy and sepsis  IV zosyn  Monitor   Aspiration pneumonia (HCC) Decompensated respiratory failure requiring 3 to 4 L nasal cannula in the setting of toxic metabolic encephalopathy with associated drug overdose, sepsis Positive aspiration pneumonia CT of the chest today IV Zosyn for respiratory coverage Resp culture  Follow   Transaminitis AST 186, ALT 178 T. bili within normal limits Suspect secondary to polysubstance abuse as  well as alcohol use Alcohol and Tylenol level within normal limits Check hepatitis panel RUQ u/s  Monitor   Sepsis (HCC) Meeting sepsis criteria with heart rate 100s, respirations in the upper 20s in setting of substance abuse/drug overdose, encephalopathy Noted aspiration pneumonia on CT imaging as well as sinusitis Lactate pending Procalcitonin pending Placed on Zosyn and vancomycin for aspiration pneumonia sinusitis coverage pending MRSA swab Panculture Monitor   Acute encephalopathy Positive generalized lethargy in the setting of polysubstance  abuse and overdose Found unresponsive at home s/p ativan  UDS today positive for cocaine, marijuana Noted fentanyl use status post Narcan Aspiration pneumonia on CT scan as well as overlapping sinusitis IV fluid hydration IV Zosyn for sinusitis and aspiration pneumonia coverage Withdrawal protocol Monitor   Greater than 50% was spent in counseling, critical care evaluation and coordination of care with patient Total encounter time 80 minutes or more    Advance Care Planning:   Code Status: Not on file   Consults: None   Family Communication: No family at the bedside   Severity of Illness: The appropriate patient status for this patient is INPATIENT. Inpatient status is judged to be reasonable and necessary in order to provide the required intensity of service to ensure the patient's safety. The patient's presenting symptoms, physical exam findings, and initial radiographic and laboratory data in the context of their chronic comorbidities is felt to place them at high risk for further clinical deterioration. Furthermore, it is not anticipated that the patient will be medically stable for discharge from the hospital within 2 midnights of admission.   * I certify that at the point of admission it is my clinical judgment that the patient will require inpatient hospital care spanning beyond 2 midnights from the point of admission due to high intensity of service, high risk for further deterioration and high frequency of surveillance required.*  Author: Floydene Flock, MD 11/16/2023 7:35 AM  For on call review [email or web address].

## 2023-11-16 NOTE — ED Notes (Signed)
Patient placed on NRB.

## 2023-11-16 NOTE — Assessment & Plan Note (Addendum)
Present on admission with tachycardia, tachypnea, leukocytosis, lactic acidosis, acute hypoxic respiratory failure, acute toxic metabolic encephalopathy multifocal pneumonia. Patient was given vancomycin and Rocephin.  Will switch over to doxycycline and Omnicef.  White blood cell count in normal range upon discharge.

## 2023-11-16 NOTE — Assessment & Plan Note (Addendum)
Trending better.  Hepatitis C positive.  HIV negative.  Follow-up as outpatient for hepatitis C.

## 2023-11-16 NOTE — Assessment & Plan Note (Addendum)
Decompensated respiratory failure in setting of toxic metabolic encephalopathy and substance abuse.  Initial pulse ox of 87% on 6 L. Continue antibiotics for pneumonia.  Advised not to use any drugs. Respiratory failure improved.  Upon discharge pulse ox 99% on room air.

## 2023-11-17 ENCOUNTER — Other Ambulatory Visit: Payer: Self-pay

## 2023-11-17 DIAGNOSIS — A419 Sepsis, unspecified organism: Secondary | ICD-10-CM | POA: Diagnosis not present

## 2023-11-17 DIAGNOSIS — F1910 Other psychoactive substance abuse, uncomplicated: Secondary | ICD-10-CM | POA: Insufficient documentation

## 2023-11-17 DIAGNOSIS — J0140 Acute pansinusitis, unspecified: Secondary | ICD-10-CM

## 2023-11-17 DIAGNOSIS — G9341 Metabolic encephalopathy: Secondary | ICD-10-CM | POA: Diagnosis not present

## 2023-11-17 DIAGNOSIS — J188 Other pneumonia, unspecified organism: Secondary | ICD-10-CM | POA: Insufficient documentation

## 2023-11-17 DIAGNOSIS — J189 Pneumonia, unspecified organism: Secondary | ICD-10-CM | POA: Diagnosis not present

## 2023-11-17 DIAGNOSIS — J9601 Acute respiratory failure with hypoxia: Secondary | ICD-10-CM | POA: Diagnosis not present

## 2023-11-17 HISTORY — DX: Other pneumonia, unspecified organism: J18.8

## 2023-11-17 HISTORY — DX: Metabolic encephalopathy: G93.41

## 2023-11-17 LAB — COMPREHENSIVE METABOLIC PANEL
ALT: 103 U/L — ABNORMAL HIGH (ref 0–44)
AST: 65 U/L — ABNORMAL HIGH (ref 15–41)
Albumin: 3.6 g/dL (ref 3.5–5.0)
Alkaline Phosphatase: 41 U/L (ref 38–126)
Anion gap: 11 (ref 5–15)
BUN: 8 mg/dL (ref 6–20)
CO2: 26 mmol/L (ref 22–32)
Calcium: 9 mg/dL (ref 8.9–10.3)
Chloride: 98 mmol/L (ref 98–111)
Creatinine, Ser: 0.67 mg/dL (ref 0.61–1.24)
GFR, Estimated: >60 mL/min (ref >60)
Glucose, Bld: 102 mg/dL — ABNORMAL HIGH (ref 70–99)
Potassium: 3.4 mmol/L — ABNORMAL LOW (ref 3.5–5.1)
Sodium: 135 mmol/L (ref 135–145)
Total Bilirubin: 1.3 mg/dL — ABNORMAL HIGH (ref <1.2)
Total Protein: 6.7 g/dL (ref 6.5–8.1)

## 2023-11-17 LAB — HIV ANTIBODY (ROUTINE TESTING W REFLEX): HIV Screen 4th Generation wRfx: NONREACTIVE

## 2023-11-17 LAB — CBC
HCT: 40.1 % (ref 39.0–52.0)
Hemoglobin: 14.1 g/dL (ref 13.0–17.0)
MCH: 33.5 pg (ref 26.0–34.0)
MCHC: 35.2 g/dL (ref 30.0–36.0)
MCV: 95.2 fL (ref 80.0–100.0)
Platelets: 179 10*3/uL (ref 150–400)
RBC: 4.21 MIL/uL — ABNORMAL LOW (ref 4.22–5.81)
RDW: 11.6 % (ref 11.5–15.5)
WBC: 17.3 10*3/uL — ABNORMAL HIGH (ref 4.0–10.5)
nRBC: 0 % (ref 0.0–0.2)

## 2023-11-17 LAB — CK: Total CK: 85 U/L (ref 49–397)

## 2023-11-17 LAB — URINALYSIS, ROUTINE W REFLEX MICROSCOPIC
Bilirubin Urine: NEGATIVE
Glucose, UA: NEGATIVE mg/dL
Hgb urine dipstick: NEGATIVE
Ketones, ur: 5 mg/dL — AB
Leukocytes,Ua: NEGATIVE
Nitrite: NEGATIVE
Protein, ur: NEGATIVE mg/dL
Specific Gravity, Urine: 1.025 (ref 1.005–1.030)
pH: 7 (ref 5.0–8.0)

## 2023-11-17 LAB — HEPATITIS PANEL, ACUTE
HCV Ab: REACTIVE — AB
Hep A IgM: NONREACTIVE
Hep B C IgM: NONREACTIVE
Hepatitis B Surface Ag: NONREACTIVE

## 2023-11-17 LAB — LACTIC ACID, PLASMA: Lactic Acid, Venous: 1.1 mmol/L (ref 0.5–1.9)

## 2023-11-17 LAB — PROCALCITONIN: Procalcitonin: 0.32 ng/mL

## 2023-11-17 MED ORDER — SODIUM CHLORIDE 0.9 % IV SOLN
2.0000 g | INTRAVENOUS | Status: DC
  Administered 2023-11-17: 2 g via INTRAVENOUS
  Filled 2023-11-17 (×2): qty 20

## 2023-11-17 MED ORDER — IBUPROFEN 400 MG PO TABS
600.0000 mg | ORAL_TABLET | Freq: Once | ORAL | Status: AC
  Administered 2023-11-17: 600 mg via ORAL
  Filled 2023-11-17: qty 2

## 2023-11-17 MED ORDER — POTASSIUM CHLORIDE CRYS ER 20 MEQ PO TBCR
40.0000 meq | EXTENDED_RELEASE_TABLET | Freq: Once | ORAL | Status: AC
  Administered 2023-11-17: 40 meq via ORAL
  Filled 2023-11-17: qty 2

## 2023-11-17 NOTE — Assessment & Plan Note (Addendum)
Acute toxic metabolic encephalopathy.  Likely from substance abuse and infection.  Improved today.  Patient advised not to use drugs or alcohol.

## 2023-11-17 NOTE — Hospital Course (Addendum)
27 year old man with past medical history of substance abuse, tobacco abuse presented with acute metabolic encephalopathy, acute respiratory failure with hypoxia and sepsis and elevated liver function tests.  Patient was found to be unresponsive at home.  EMS gave Narcan.  Patient was found to have widespread pulmonary opacities on CT scan along with sinusitis.  Patient was started on antibiotics.  12/5.  Patient feeling better than yesterday.  Offers no complaints. 12 6.  Patient feeling better today.  Some cough.  No fever.  White blood cell count normalized.  Stable for discharge home.

## 2023-11-17 NOTE — Plan of Care (Signed)

## 2023-11-17 NOTE — TOC Progression Note (Addendum)
Transition of Care (TOC) - Progression Note    Patient Details  Name: Lawrence Griffith. MRN: [id number] Date of Birth: [birth year]  Transition of Care [facility]) CM/SW Contact  Truddie Hidden, RN Phone Number: 11/17/2023, 4:09 PM  Clinical Narrative:    Sherron Monday with patient at bedside regarding PCP. Patient stated he has a PCP. He could not recall the PCP name but has an scheduled appointment 1/27 at a location on [address].        Expected Discharge Plan and Services                                               Social Determinants of Health (SDOH) Interventions SDOH Screenings   Food Insecurity: No Food Insecurity (11/16/2023)  Housing: Patient Declined (11/16/2023)  Transportation Needs: No Transportation Needs (11/16/2023)  Utilities: Patient Unable To Answer (11/16/2023)  Tobacco Use: High Risk (11/16/2023)    Readmission Risk Interventions     No data to display

## 2023-11-17 NOTE — Assessment & Plan Note (Addendum)
Alcohol withdrawal protocol.  Urine toxicology positive for cocaine and cannabis.  Patient does eat CBD Gummies which would cause the cannabis to be positive.  Patient denies using cocaine.

## 2023-11-17 NOTE — Progress Notes (Signed)
  Progress Note   Patient: Lawrence Griffith. ONG:[id number] DOB: May 11, 1996 DOA: 11/16/2023     1 DOS: the patient was seen and examined on 11/17/2023   Brief hospital course: 27 year old man with past medical history of substance abuse, tobacco abuse presented with acute metabolic encephalopathy, acute respiratory failure with hypoxia and sepsis and elevated liver function tests.  Patient was found to be unresponsive at home.  EMS gave Narcan.  Patient was found to have widespread pulmonary opacities on CT scan along with sinusitis.  Patient was started on antibiotics.  12/5.  Patient feeling better than yesterday.  Offers no complaints.  Assessment and Plan: * Severe sepsis (HCC) Present on admission with tachycardia, tachypnea, leukocytosis, lactic acidosis, acute hypoxic respiratory failure, acute toxic metabolic encephalopathy multifocal pneumonia. Continue vancomycin switch Zosyn over to Rocephin.   Acute respiratory failure with hypoxia (HCC) Decompensated respiratory failure in setting of toxic metabolic encephalopathy and substance abuse Continue antibiotics for pneumonia.  Advised not to use any drugs.  Acute metabolic encephalopathy Acute toxic metabolic encephalopathy.  Likely from substance abuse and infection.  Improved today.  Polysubstance abuse (HCC) Alcohol withdrawal protocol.  Urine toxicology positive for cocaine and cannabis.  Sinusitis Continue Rocephin and vancomycin  Transaminitis Trending better.  Hepatitis C positive.  HIV negative.        Subjective: Patient admitted with unresponsiveness and pneumonia.  Started on antibiotics.  Physical Exam: Vitals:   11/17/23 0444 11/17/23 0654 11/17/23 0816 11/17/23 1121  BP: 117/80  120/78 133/79  Pulse: 78  74 83  Resp: 18  18 18   Temp: 97.8 F (36.6 C)  98.4 F (36.9 C) 98 F (36.7 C)  TempSrc: Oral Oral Oral Oral  SpO2: 100%  99% 100%  Weight:  87.6 kg    Height:  5\' 3"  (1.6 m)     Physical  Exam HENT:     Head: Normocephalic.     Mouth/Throat:     Pharynx: No oropharyngeal exudate.  Eyes:     General: Lids are normal.     Conjunctiva/sclera: Conjunctivae normal.  Cardiovascular:     Rate and Rhythm: Normal rate and regular rhythm.     Heart sounds: Normal heart sounds, S1 normal and S2 normal.  Pulmonary:     Breath sounds: No decreased breath sounds, wheezing, rhonchi or rales.  Abdominal:     Palpations: Abdomen is soft.     Tenderness: There is no abdominal tenderness.  Musculoskeletal:     Right lower leg: No swelling.     Left lower leg: No swelling.  Skin:    General: Skin is warm.     Findings: No rash.  Neurological:     Mental Status: He is alert and oriented to person, place, and time.     Data Reviewed: CT scan of the chest showed bilateral groundglass opacities, CT scan of the sinuses shows sinusitis Positive lactic acid of 2.8 and 2.7.  White blood cell count of 17.3, potassium 3.4, creatinine 0.67, AST 65 ALT 103  Family Communication: Voicemail box for patient's mother not connected  Disposition: Status is: Inpatient Remains inpatient appropriate because: Continue IV antibiotics  Planned Discharge Destination: Home    Time spent: 28 minutes  Author: Alford Highland, MD 11/17/2023 1:00 PM  For on call review [email or web address].

## 2023-11-17 NOTE — Plan of Care (Signed)
  Problem: Clinical Measurements: Goal: Diagnostic test results will improve Outcome: Progressing Goal: Signs and symptoms of infection will decrease Outcome: Progressing   Problem: Respiratory: Goal: Ability to maintain adequate ventilation will improve Outcome: Progressing   

## 2023-11-17 NOTE — Progress Notes (Signed)
Pharmacy Antibiotic Note  Lawrence Griffith. is a 27 y.o. male admitted on 11/16/2023 with sepsis after being found unresponsive at home. History of substance abuse. Per provider concerned for MRSA CAP pneumonia and not aspiration pneumonia. Patient has reported previous reaction to PCN during childhood (welts). Declined to try an amoxicillin based antibiotic, tolerated Zosyn. Patient was deescalated from Zosyn to ceftriaxone. Keeping vancomycin until cultures result or for duration of therapy. MRSA nares positive. Pharmacy has been consulted for vancomycin dosing.  11/17/2023 Tmax 99.8 WBC 17.3 >> 10 Scr 0.67 Imaging: Widespread bilateral pulmonary ground-glass opacity, with patchy solid peribronchial opacity in the right upper lobe. In this setting the top differential considerations are aspiration and noncardiogenic pulmonary edema. No pleural effusion or pneumothorax.  Plan: Day 2 of antibiotics Give vancomycin 2000 mg IV x1 followed by 1750 mg IV Q12H. Goal AUC 400-550. Expected AUC: 493.2 Expected Css min: 11.8 SCr used: 0.8  Weight used: IBW, Vd used: 0.72 (BMI 27.32) Also on ceftriaxone 2g IV Q24H  Continue to monitor renal function and follow culture results  Height: 5\' 3"  (160 cm) Weight: 87.6 kg (193 lb 2 oz) IBW/kg (Calculated) : 56.9  Temp (24hrs), Avg:98.6 F (37 C), Min:97.8 F (36.6 C), Max:99.8 F (37.7 C)  Recent Labs  Lab 11/16/23 0218 11/16/23 0806 11/16/23 2103 11/16/23 2155 11/17/23 0107  WBC 10.0  --   --   --  17.3*  CREATININE 0.74  --   --   --  0.67  LATICACIDVEN  --  2.8* 1.9 2.7* 1.1    Estimated Creatinine Clearance: 133.4 mL/min (by C-G formula based on SCr of 0.67 mg/dL).    Allergies  Allergen Reactions   Clarithromycin     Other reaction(s): HIVES   Penicillamine Hives   Penicillins Hives    Has patient had a PCN reaction causing immediate rash, facial/tongue/throat swelling, SOB or lightheadedness with hypotension: Yes Has patient  had a PCN reaction causing severe rash involving mucus membranes or skin necrosis: No Has patient had a PCN reaction that required hospitalization No Has patient had a PCN reaction occurring within the last 10 years: Yes If all of the above answers are "NO", then may proceed with Cephalosporin use.   Sulfa Antibiotics     Other reaction(s): HIVES    Antimicrobials this admission: 12/4 Zosyn >> 12/5 12/4 Metronidazole x 1 12/4 Vancomycin >>  12/4 Ceftriaxone x 1, then re-started ceftriaxone 2g Q24H 12/5 >>   Dose adjustments this admission: None  Microbiology results: 12/4 Bcx in process 12/4 MRSA nares: positive  Thank you for allowing pharmacy to be a part of this patient's care.  Effie Shy, PharmD Pharmacy Resident  11/17/2023 6:03 PM

## 2023-11-18 DIAGNOSIS — J189 Pneumonia, unspecified organism: Secondary | ICD-10-CM

## 2023-11-18 DIAGNOSIS — E876 Hypokalemia: Secondary | ICD-10-CM

## 2023-11-18 DIAGNOSIS — G9341 Metabolic encephalopathy: Secondary | ICD-10-CM

## 2023-11-18 DIAGNOSIS — B1920 Unspecified viral hepatitis C without hepatic coma: Secondary | ICD-10-CM | POA: Insufficient documentation

## 2023-11-18 DIAGNOSIS — J9601 Acute respiratory failure with hypoxia: Secondary | ICD-10-CM | POA: Diagnosis not present

## 2023-11-18 DIAGNOSIS — A419 Sepsis, unspecified organism: Secondary | ICD-10-CM | POA: Diagnosis not present

## 2023-11-18 DIAGNOSIS — F1910 Other psychoactive substance abuse, uncomplicated: Secondary | ICD-10-CM

## 2023-11-18 DIAGNOSIS — J018 Other acute sinusitis: Secondary | ICD-10-CM

## 2023-11-18 DIAGNOSIS — R6520 Severe sepsis without septic shock: Secondary | ICD-10-CM

## 2023-11-18 DIAGNOSIS — R7401 Elevation of levels of liver transaminase levels: Secondary | ICD-10-CM

## 2023-11-18 HISTORY — DX: Hypokalemia: E87.6

## 2023-11-18 LAB — BASIC METABOLIC PANEL
Anion gap: 8 (ref 5–15)
BUN: 7 mg/dL (ref 6–20)
CO2: 27 mmol/L (ref 22–32)
Calcium: 9.2 mg/dL (ref 8.9–10.3)
Chloride: 101 mmol/L (ref 98–111)
Creatinine, Ser: 0.65 mg/dL (ref 0.61–1.24)
GFR, Estimated: >60 mL/min (ref >60)
Glucose, Bld: 84 mg/dL (ref 70–99)
Potassium: 3.7 mmol/L (ref 3.5–5.1)
Sodium: 136 mmol/L (ref 135–145)

## 2023-11-18 LAB — CBC
HCT: 44.7 % (ref 39.0–52.0)
Hemoglobin: 15.7 g/dL (ref 13.0–17.0)
MCH: 33.5 pg (ref 26.0–34.0)
MCHC: 35.1 g/dL (ref 30.0–36.0)
MCV: 95.3 fL (ref 80.0–100.0)
Platelets: 192 10*3/uL (ref 150–400)
RBC: 4.69 MIL/uL (ref 4.22–5.81)
RDW: 11.4 % — ABNORMAL LOW (ref 11.5–15.5)
WBC: 8.7 10*3/uL (ref 4.0–10.5)
nRBC: 0 % (ref 0.0–0.2)

## 2023-11-18 MED ORDER — CEFDINIR 300 MG PO CAPS
300.0000 mg | ORAL_CAPSULE | Freq: Two times a day (BID) | ORAL | Status: DC
  Administered 2023-11-18: 300 mg via ORAL
  Filled 2023-11-18: qty 1

## 2023-11-18 MED ORDER — VANCOMYCIN HCL 1500 MG/300ML IV SOLN: 1500.0000 mg | Freq: Two times a day (BID) | INTRAVENOUS | Status: DC

## 2023-11-18 MED ORDER — FOLIC ACID 1 MG PO TABS: 1.0000 mg | ORAL_TABLET | Freq: Every day | ORAL | 0 refills | Status: DC

## 2023-11-18 MED ORDER — DOXYCYCLINE HYCLATE 100 MG PO TABS
100.0000 mg | ORAL_TABLET | Freq: Two times a day (BID) | ORAL | Status: DC
  Administered 2023-11-18: 100 mg via ORAL
  Filled 2023-11-18: qty 1

## 2023-11-18 MED ORDER — IBUPROFEN 800 MG PO TABS: 800.0000 mg | ORAL_TABLET | Freq: Two times a day (BID) | ORAL | 0 refills | Status: DC | PRN

## 2023-11-18 MED ORDER — CEFDINIR 300 MG PO CAPS: 300.0000 mg | ORAL_CAPSULE | Freq: Two times a day (BID) | ORAL | 0 refills | Status: AC

## 2023-11-18 MED ORDER — ADULT MULTIVITAMIN W/MINERALS CH: 1.0000 | ORAL_TABLET | Freq: Every day | ORAL | Status: DC

## 2023-11-18 MED ORDER — VITAMIN B-1 100 MG PO TABS: 100.0000 mg | ORAL_TABLET | Freq: Every day | ORAL | 0 refills | Status: DC

## 2023-11-18 MED ORDER — DOXYCYCLINE HYCLATE 100 MG PO TABS: 100.0000 mg | ORAL_TABLET | Freq: Two times a day (BID) | ORAL | 0 refills | Status: AC

## 2023-11-18 MED ORDER — IBUPROFEN 400 MG PO TABS
800.0000 mg | ORAL_TABLET | Freq: Once | ORAL | Status: AC
  Administered 2023-11-18: 800 mg via ORAL
  Filled 2023-11-18: qty 2

## 2023-11-18 NOTE — Discharge Summary (Signed)
Physician Discharge Summary   Patient: Lawrence Griffith. MRN: [id number] DOB: 08-30-1996  Admit date:     11/16/2023  Discharge date: 11/18/23  Discharge Physician: Alford Highland   PCP: Pcp, No   Recommendations at discharge:   Follow-up with your medical doctor 1 week  Discharge Diagnoses: Principal Problem:   Severe sepsis (HCC) Active Problems:   Acute respiratory failure with hypoxia (HCC)   Acute metabolic encephalopathy   Transaminitis   Sinusitis   Polysubstance abuse (HCC)   Multifocal pneumonia   Hypokalemia   Hepatitis C   Hospital Course: 27 year old man with past medical history of substance abuse, tobacco abuse presented with acute metabolic encephalopathy, acute respiratory failure with hypoxia and sepsis and elevated liver function tests.  Patient was found to be unresponsive at home.  EMS gave Narcan.  Patient was found to have widespread pulmonary opacities on CT scan along with sinusitis.  Patient was started on antibiotics.  12/5.  Patient feeling better than yesterday.  Offers no complaints. 12 6.  Patient feeling better today.  Some cough.  No fever.  White blood cell count normalized.  Stable for discharge home.  Assessment and Plan: * Severe sepsis (HCC) Present on admission with tachycardia, tachypnea, leukocytosis, lactic acidosis, acute hypoxic respiratory failure, acute toxic metabolic encephalopathy multifocal pneumonia. Patient was given vancomycin and Rocephin.  Will switch over to doxycycline and Omnicef.  White blood cell count in normal range upon discharge.   Acute respiratory failure with hypoxia (HCC) Decompensated respiratory failure in setting of toxic metabolic encephalopathy and substance abuse.  Initial pulse ox of 87% on 6 L. Continue antibiotics for pneumonia.  Advised not to use any drugs. Respiratory failure improved.  Upon discharge pulse ox 99% on room air.  Acute metabolic encephalopathy Acute toxic metabolic  encephalopathy.  Likely from substance abuse and infection.  Improved today.  Patient advised not to use drugs or alcohol.  Hypokalemia Improved  Polysubstance abuse (HCC) Alcohol withdrawal protocol.  Urine toxicology positive for cocaine and cannabis.  Patient does eat CBD Gummies which would cause the cannabis to be positive.  Patient denies using cocaine.  Sinusitis Switched over to doxycycline and Omnicef for discharge.  Transaminitis Trending better.  Hepatitis C positive.  HIV negative.  Follow-up as outpatient for hepatitis C.         Consultants: None Procedures performed: None Disposition: Home Diet recommendation:  Regular diet DISCHARGE MEDICATION: Allergies as of 11/18/2023       Reactions   Clarithromycin    Other reaction(s): HIVES   Penicillamine Hives   Penicillins Hives   Has patient had a PCN reaction causing immediate rash, facial/tongue/throat swelling, SOB or lightheadedness with hypotension: Yes Has patient had a PCN reaction causing severe rash involving mucus membranes or skin necrosis: No Has patient had a PCN reaction that required hospitalization No Has patient had a PCN reaction occurring within the last 10 years: Yes If all of the above answers are "NO", then may proceed with Cephalosporin use.   Sulfa Antibiotics    Other reaction(s): HIVES        Medication List     TAKE these medications    cefdinir 300 MG capsule Commonly known as: OMNICEF Take 1 capsule (300 mg total) by mouth every 12 (twelve) hours for 9 doses.   doxycycline 100 MG tablet Commonly known as: VIBRA-TABS Take 1 tablet (100 mg total) by mouth every 12 (twelve) hours for 9 doses.   folic acid 1 MG  tablet Commonly known as: FOLVITE Take 1 tablet (1 mg total) by mouth daily. Start taking on: November 19, 2023   ibuprofen 800 MG tablet Commonly known as: ADVIL Take 1 tablet (800 mg total) by mouth every 12 (twelve) hours as needed.   multivitamin with  minerals Tabs tablet Take 1 tablet by mouth daily. Start taking on: November 19, 2023   thiamine 100 MG tablet Commonly known as: Vitamin B-1 Take 1 tablet (100 mg total) by mouth daily. Start taking on: November 19, 2023        Follow-up Information     your medical doctor Follow up in 1 week(s).                 Discharge Exam: Filed Weights   11/16/23 0803 11/16/23 2141 11/17/23 0654  Weight: 83.9 kg 87.6 kg 87.6 kg   Physical Exam HENT:     Head: Normocephalic.     Mouth/Throat:     Pharynx: No oropharyngeal exudate.  Eyes:     General: Lids are normal.     Conjunctiva/sclera: Conjunctivae normal.  Cardiovascular:     Rate and Rhythm: Normal rate and regular rhythm.     Heart sounds: Normal heart sounds, S1 normal and S2 normal.  Pulmonary:     Breath sounds: No decreased breath sounds, wheezing, rhonchi or rales.  Abdominal:     Palpations: Abdomen is soft.     Tenderness: There is no abdominal tenderness.  Musculoskeletal:     Right lower leg: No swelling.     Left lower leg: No swelling.  Skin:    General: Skin is warm.     Findings: No rash.  Neurological:     Mental Status: He is alert and oriented to person, place, and time.      Condition at discharge: stable  The results of significant diagnostics from this hospitalization (including imaging, microbiology, ancillary and laboratory) are listed below for reference.   Imaging Studies: US Abdomen Limited RUQ (LIVER/GB)  Result Date: 11/16/2023 CLINICAL DATA:  Transaminitis. EXAM: ULTRASOUND ABDOMEN LIMITED RIGHT UPPER QUADRANT COMPARISON:  None Available. FINDINGS: Gallbladder: No gallstones or wall thickening visualized. No sonographic Murphy sign noted by sonographer. Common bile duct: Diameter: Normal at 3 mm Liver: No focal lesion identified. Mildly increased hepatic echogenicity portal vein is patent on color Doppler imaging with normal direction of blood flow towards the liver. Other: No  ascites IMPRESSION: . 1. Normal gallbladder and biliary tree. 2. No focal hepatic lesion. 3. Mild increased in hepatic echogenicity suggests mild hepatic steatosis. Electronically Signed   By: Genevive Bi M.D.   On: 11/16/2023 10:49   CT CHEST ABDOMEN PELVIS W CONTRAST  Result Date: 11/16/2023 CLINICAL DATA:  27 year old male found with suspected drug overdose. EXAM: CT CHEST, ABDOMEN, AND PELVIS WITH CONTRAST TECHNIQUE: Multidetector CT imaging of the chest, abdomen and pelvis was performed following the standard protocol during bolus administration of intravenous contrast. RADIATION DOSE REDUCTION: This exam was performed according to the departmental dose-optimization program which includes automated exposure control, adjustment of the mA and/or kV according to patient size and/or use of iterative reconstruction technique. CONTRAST:  OMNIPAQUE IOHEXOL 300 MG/ML  SOLN COMPARISON:  CT Abdomen and Pelvis 06/28/2016. Portable chest 0231 hours today. FINDINGS: CT CHEST FINDINGS Cardiovascular: Cardiac pulsation artifact. Major mediastinal vascular structures appear intact. No atherosclerosis identified. Heart size is at the upper limits of normal. No pericardial effusion. Mediastinum/Nodes: Negative. No mediastinal mass, hematoma, lymphadenopathy. Small gastric hiatal hernia  versus phrenic ampulla (normal variant). Lungs/Pleura: Scattered bilateral pulmonary ground-glass opacity, peribronchial and only the right middle lobe is affected. Superimposed patchy solid peribronchial opacity in the right lung apex, the posterior right upper lobe. Some atelectatic changes to the major airways which are otherwise patent. No pleural effusion. No pneumothorax. Musculoskeletal: No acute osseous abnormality identified. Small anterior superior chest wall probable spacious cyst series 2, image 16 (no follow-up imaging recommended). CT ABDOMEN PELVIS FINDINGS Hepatobiliary: Borderline hepatic steatosis.  Negative  gallbladder. Pancreas: Negative. Spleen: Negative. Adrenals/Urinary Tract: Normal adrenal glands. Symmetric renal enhancement. Kidneys appear nonobstructed, no delayed renal images. Urinary bladder is distended (sagittal image 62), estimated volume 1100 mL. But otherwise unremarkable. Stomach/Bowel: Mild large bowel redundancy and retained stool. Normal retrocecal appendix on series 2, image 92. No dilated small bowel, free air or free fluid. Negative stomach and duodenum. Vascular/Lymphatic: Major arterial structures in the portal venous system in the abdomen and pelvis appear patent and normal. No lymphadenopathy. Reproductive: Negative. Other: No pelvis free fluid. Musculoskeletal: No acute osseous abnormality identified. IMPRESSION: 1. Widespread bilateral pulmonary ground-glass opacity, with patchy solid peribronchial opacity in the right upper lobe. In this setting the top differential considerations are aspiration and noncardiogenic pulmonary edema. No pleural effusion or pneumothorax. 2. No other acute or inflammatory finding identified in the chest, abdomen, or pelvis; distended urinary bladder (1,100 mL). Electronically Signed   By: Odessa Fleming M.D.   On: 11/16/2023 04:34   CT MAXILLOFACIAL WO CONTRAST  Result Date: 11/16/2023 CLINICAL DATA:  Drug overdose.  Found down. EXAM: CT MAXILLOFACIAL WITHOUT CONTRAST TECHNIQUE: Multidetector CT imaging of the maxillofacial structures was performed. Multiplanar CT image reconstructions were also generated. RADIATION DOSE REDUCTION: This exam was performed according to the departmental dose-optimization program which includes automated exposure control, adjustment of the mA and/or kV according to patient size and/or use of iterative reconstruction technique. COMPARISON:  None Available. FINDINGS: Osseous: No fracture or mandibular dislocation. Poor dentition with multiple caries and periapical lucencies, greatest at the left mandibular third molar. Orbits: Negative.  No traumatic or inflammatory finding. Sinuses: Moderate bilateral maxillary sinus mucosal thickening. Soft tissues: Negative. Limited intracranial: Normal IMPRESSION: 1. No acute facial bone fracture. 2. Poor dentition with multiple caries and periapical lucencies, greatest at the left mandibular third molar. 3. Bilateral maxillary odontogenic sinusitis. Electronically Signed   By: Deatra Robinson M.D.   On: 11/16/2023 03:50   CT CERVICAL SPINE WO CONTRAST  Result Date: 11/16/2023 CLINICAL DATA:  Drug overdose.  Patient found lying on the floor. EXAM: CT CERVICAL SPINE WITHOUT CONTRAST TECHNIQUE: Multidetector CT imaging of the cervical spine was performed without intravenous contrast. Multiplanar CT image reconstructions were also generated. RADIATION DOSE REDUCTION: This exam was performed according to the departmental dose-optimization program which includes automated exposure control, adjustment of the mA and/or kV according to patient size and/or use of iterative reconstruction technique. COMPARISON:  Apr 23, 2023 FINDINGS: Alignment: There is reversal of the normal cervical spine lordosis. Skull base and vertebrae: No acute fracture. No primary bone lesion or focal pathologic process. Soft tissues and spinal canal: No prevertebral fluid or swelling. No visible canal hematoma. Disc levels: Stable, moderate severity posterior bony spurring is seen at the level of C3-C4 with normal endplates present throughout the remainder of the cervical spine. Normal intervertebral disc spaces are noted throughout the cervical spine. Normal, bilateral multilevel facet joints are noted. Upper chest: Marked severity airspace disease is seen within the visualized portion of the right upper lobe. Other:  None. IMPRESSION: 1. No acute fracture or subluxation of the cervical spine. 2. Marked severity right upper lobe airspace disease. Electronically Signed   By: Aram Candela M.D.   On: 11/16/2023 03:46   CT HEAD WO  CONTRAST  Result Date: 11/16/2023 CLINICAL DATA:  Drug overdose.  Patient found lying on the floor. EXAM: CT HEAD WITHOUT CONTRAST TECHNIQUE: Contiguous axial images were obtained from the base of the skull through the vertex without intravenous contrast. RADIATION DOSE REDUCTION: This exam was performed according to the departmental dose-optimization program which includes automated exposure control, adjustment of the mA and/or kV according to patient size and/or use of iterative reconstruction technique. COMPARISON:  July 26, 2023 FINDINGS: Brain: No evidence of acute infarction, hemorrhage, hydrocephalus, extra-axial collection or mass lesion/mass effect. Vascular: No hyperdense vessel or unexpected calcification. Skull: A nondisplaced left-sided nasal bone fracture of indeterminate age is noted. Sinuses/Orbits: There is moderate severity bilateral maxillary sinus and marked severity bilateral ethmoid sinus mucosal thickening. Other: Mild, stable left occipital scalp soft tissue swelling is noted. Stable left parietal scalp calcifications are also seen. IMPRESSION: 1. No acute intracranial abnormality. 2. Nondisplaced left-sided nasal bone fracture of indeterminate age. 3. Moderate severity bilateral maxillary sinus and marked severity bilateral ethmoid sinus disease. Electronically Signed   By: Aram Candela M.D.   On: 11/16/2023 03:44   DG Clavicle Right  Result Date: 11/16/2023 CLINICAL DATA:  Drug overdose. EXAM: RIGHT CLAVICLE - 2+ VIEWS COMPARISON:  None Available. FINDINGS: There is no evidence of an acute fracture or dislocation. A chronic appearing deformity is seen involving the distal aspect of the right clavicle. Soft tissues are unremarkable. IMPRESSION: Chronic appearing deformity of the distal right clavicle without an acute osseous abnormality. Electronically Signed   By: Aram Candela M.D.   On: 11/16/2023 03:08   DG Hand Complete Left  Result Date: 11/16/2023 CLINICAL DATA:   Drug overdose, found on the floor. EXAM: LEFT HAND - COMPLETE 3+ VIEW COMPARISON:  None Available. FINDINGS: There is no evidence of acute fracture or dislocation. A chronic deformity is seen involving the distal left radius and adjacent carpal bones. Soft tissues are unremarkable. IMPRESSION: Chronic deformity of the distal left radius and adjacent carpal bones. Electronically Signed   By: Aram Candela M.D.   On: 11/16/2023 03:07   DG Chest Port 1 View  Result Date: 11/16/2023 CLINICAL DATA:  Drug overdose. EXAM: PORTABLE CHEST 1 VIEW COMPARISON:  July 26, 2023 FINDINGS: The heart size and mediastinal contours are within normal limits. Low lung volumes are noted. Very mild atelectasis is seen within the left lung base without evidence of acute infiltrate, pleural effusion or pneumothorax. The visualized skeletal structures are unremarkable. IMPRESSION: No active disease. Electronically Signed   By: Aram Candela M.D.   On: 11/16/2023 03:06    Microbiology: Results for orders placed or performed during the hospital encounter of 11/16/23  Culture, blood (x 2)     Status: None (Preliminary result)   Collection Time: 11/16/23  8:06 AM   Specimen: BLOOD RIGHT ARM  Result Value Ref Range Status   Specimen Description BLOOD RIGHT ARM  Final   Special Requests   Final    BOTTLES DRAWN AEROBIC AND ANAEROBIC Blood Culture adequate volume   Culture   Final    NO GROWTH 2 DAYS Performed at [facility], [address], [place], [place] [address]    Report Status PENDING  Incomplete  Culture, blood (x 2)  Status: None (Preliminary result)   Collection Time: 11/16/23  8:06 AM   Specimen: BLOOD LEFT ARM  Result Value Ref Range Status   Specimen Description BLOOD LEFT ARM  Final   Special Requests   Final    BOTTLES DRAWN AEROBIC AND ANAEROBIC Blood Culture adequate volume   Culture   Final    NO GROWTH 2 DAYS Performed at [facility], [address]  [place], [place] [address]    Report Status PENDING  Incomplete  MRSA Next Gen by PCR, Nasal     Status: Abnormal   Collection Time: 11/16/23  8:40 AM   Specimen: Nasal Mucosa; Nasal Swab  Result Value Ref Range Status   MRSA by PCR Next Gen DETECTED (A) NOT DETECTED Final    Comment: RESULT CALLED TO, READ BACK BY AND VERIFIED WITH: Benjamine Mola, RN [phone] 11/16/23 GM (NOTE) The GeneXpert MRSA Assay (FDA approved for NASAL specimens only), is one component of a comprehensive MRSA colonization surveillance program. It is not intended to diagnose MRSA infection nor to guide or monitor treatment for MRSA infections. Test performance is not FDA approved in patients less than 35 years old. Performed at [facility], [address] [place], [place] [address]     Labs: CBC: Recent Labs  Lab 11/16/23 0218 11/17/23 0107 11/18/23 0832  WBC 10.0 17.3* 8.7  NEUTROABS 7.6  --   --   HGB 17.2* 14.1 15.7  HCT 49.3 40.1 44.7  MCV 96.9 95.2 95.3  PLT 270 179 192   Basic Metabolic Panel: Recent Labs  Lab 11/16/23 0218 11/17/23 0107 11/18/23 0832  NA 137 135 136  K 4.0 3.4* 3.7  CL 101 98 101  CO2 24 26 27   GLUCOSE 166* 102* 84  BUN 7 8 7   CREATININE 0.74 0.67 0.65  CALCIUM 8.4* 9.0 9.2  MG 2.3  --   --    Liver Function Tests: Recent Labs  Lab 11/16/23 0218 11/17/23 0107  AST 186* 65*  ALT 178* 103*  ALKPHOS 59 41  BILITOT 0.9 1.3*  PROT 8.5* 6.7  ALBUMIN 4.6 3.6   CBG: Recent Labs  Lab 11/16/23 0222  GLUCAP 130*    Discharge time spent: greater than 30 minutes.  Signed: Alford Highland, MD [facility] 11/18/2023

## 2023-11-18 NOTE — Assessment & Plan Note (Signed)
Improved

## 2023-11-18 NOTE — TOC Progression Note (Signed)
Transition of Care (TOC) - Progression Note    Patient Details  Name: Lawrence Griffith. MRN: [id number] Date of Birth: [birth year]  Transition of Care [facility]) CM/SW Contact  Truddie Hidden, RN Phone Number: 11/18/2023, 11:26 AM  Clinical Narrative:    Spoke with patient's mother, Tammy. She stated she and patient live in the car. She was reluctant to receive housing information but was agreeable. Housing resources added to AVS.  Patient will be seen at [facility] for PCP appointment 01/09/2024.   TOC signing off.           Expected Discharge Plan and Services         Expected Discharge Date: 11/18/23                                     Social Determinants of Health (SDOH) Interventions SDOH Screenings   Food Insecurity: No Food Insecurity (11/16/2023)  Housing: Patient Declined (11/16/2023)  Transportation Needs: No Transportation Needs (11/16/2023)  Utilities: Patient Unable To Answer (11/16/2023)  Tobacco Use: High Risk (11/16/2023)    Readmission Risk Interventions     No data to display

## 2023-11-18 NOTE — Progress Notes (Signed)
This RN provided discharge instructions and teaching to the patient. The patient verbalized and demonstrated understanding of the provided instructions. All outstanding questions resolved. Bilateral arm PIVs removed. Both cannulas intact. Pt tolerated well. All belongings packed and in tow.

## 2023-11-21 LAB — CULTURE, BLOOD (ROUTINE X 2)
Culture: NO GROWTH
Culture: NO GROWTH
Special Requests: ADEQUATE
Special Requests: ADEQUATE

## 2024-01-09 ENCOUNTER — Ambulatory Visit: Payer: MEDICAID | Admitting: Nurse Practitioner

## 2024-01-09 NOTE — Progress Notes (Deleted)
 There were no vitals taken for this visit.   Subjective:    Patient ID: Lawrence Mar., male    DOB: 10/25/1996, 28 y.o.   MRN: [id number]  HPI: Lawrence Griffith. is a 28 y.o. male  No chief complaint on file.   Discussed the use of AI scribe software for clinical note transcription with the patient, who gave verbal consent to proceed.  History of Present Illness            No data to display          Relevant past medical, surgical, family and social history reviewed and updated as indicated. Interim medical history since our last visit reviewed. Allergies and medications reviewed and updated.  Review of Systems  Per HPI unless specifically indicated above     Objective:    There were no vitals taken for this visit.  {Vitals History (Optional):23777} Wt Readings from Last 3 Encounters:  11/17/23 193 lb 2 oz (87.6 kg)  09/29/23 187 lb 6.3 oz (85 kg)  07/26/23 187 lb 6.3 oz (85 kg)    Physical Exam  Results for orders placed or performed during the hospital encounter of 11/16/23  CBC with Differential/Platelet   Collection Time: 11/16/23  2:18 AM  Result Value Ref Range   WBC 10.0 4.0 - 10.5 K/uL   RBC 5.09 4.22 - 5.81 MIL/uL   Hemoglobin 17.2 (H) 13.0 - 17.0 g/dL   HCT 65.7 84.6 - 96.2 %   MCV 96.9 80.0 - 100.0 fL   MCH 33.8 26.0 - 34.0 pg   MCHC 34.9 30.0 - 36.0 g/dL   RDW 95.2 84.1 - 32.4 %   Platelets 270 150 - 400 K/uL   nRBC 0.0 0.0 - 0.2 %   Neutrophils Relative % 74 %   Neutro Abs 7.6 1.7 - 7.7 K/uL   Lymphocytes Relative 18 %   Lymphs Abs 1.8 0.7 - 4.0 K/uL   Monocytes Relative 5 %   Monocytes Absolute 0.5 0.1 - 1.0 K/uL   Eosinophils Relative 1 %   Eosinophils Absolute 0.1 0.0 - 0.5 K/uL   Basophils Relative 1 %   Basophils Absolute 0.1 0.0 - 0.1 K/uL   Immature Granulocytes 1 %   Abs Immature Granulocytes 0.07 0.00 - 0.07 K/uL  Comprehensive metabolic panel   Collection Time: 11/16/23  2:18 AM  Result Value Ref Range    Sodium 137 135 - 145 mmol/L   Potassium 4.0 3.5 - 5.1 mmol/L   Chloride 101 98 - 111 mmol/L   CO2 24 22 - 32 mmol/L   Glucose, Bld 166 (H) 70 - 99 mg/dL   BUN 7 6 - 20 mg/dL   Creatinine, Ser 4.01 0.61 - 1.24 mg/dL   Calcium 8.4 (L) 8.9 - 10.3 mg/dL   Total Protein 8.5 (H) 6.5 - 8.1 g/dL   Albumin 4.6 3.5 - 5.0 g/dL   AST 027 (H) 15 - 41 U/L   ALT 178 (H) 0 - 44 U/L   Alkaline Phosphatase 59 38 - 126 U/L   Total Bilirubin 0.9 <1.2 mg/dL   GFR, Estimated >25 >36 mL/min   Anion gap 12 5 - 15  Acetaminophen level   Collection Time: 11/16/23  2:18 AM  Result Value Ref Range   Acetaminophen (Tylenol), Serum <10 (L) 10 - 30 ug/mL  Salicylate level   Collection Time: 11/16/23  2:18 AM  Result Value Ref Range   Salicylate Lvl <7.0 (L) 7.0 -  30.0 mg/dL  Ethanol   Collection Time: 11/16/23  2:18 AM  Result Value Ref Range   Alcohol, Ethyl (B) 332 (HH) <10 mg/dL  Magnesium   Collection Time: 11/16/23  2:18 AM  Result Value Ref Range   Magnesium 2.3 1.7 - 2.4 mg/dL  Brain natriuretic peptide   Collection Time: 11/16/23  2:18 AM  Result Value Ref Range   B Natriuretic Peptide 15.0 0.0 - 100.0 pg/mL  Troponin I (High Sensitivity)   Collection Time: 11/16/23  2:18 AM  Result Value Ref Range   Troponin I (High Sensitivity) 7 <18 ng/L  Urine Drug Screen, Qualitative ([facility] only)   Collection Time: 11/16/23  2:20 AM  Result Value Ref Range   Tricyclic, Ur Screen NONE DETECTED NONE DETECTED   Amphetamines, Ur Screen NONE DETECTED NONE DETECTED   MDMA (Ecstasy)Ur Screen NONE DETECTED NONE DETECTED   Cocaine Metabolite,Ur [place] POSITIVE (A) NONE DETECTED   Opiate, Ur Screen NONE DETECTED NONE DETECTED   Phencyclidine (PCP) Ur S NONE DETECTED NONE DETECTED   Cannabinoid 50 Ng, Ur  POSITIVE (A) NONE DETECTED   Barbiturates, Ur Screen NONE DETECTED NONE DETECTED   Benzodiazepine, Ur Scrn NONE DETECTED NONE DETECTED   Methadone Scn, Ur NONE DETECTED NONE DETECTED  CBG monitoring, ED    Collection Time: 11/16/23  2:22 AM  Result Value Ref Range   Glucose-Capillary 130 (H) 70 - 99 mg/dL  Ammonia   Collection Time: 11/16/23  3:42 AM  Result Value Ref Range   Ammonia 24 9 - 35 umol/L  Culture, blood (x 2)   Collection Time: 11/16/23  8:06 AM   Specimen: BLOOD RIGHT ARM  Result Value Ref Range   Specimen Description BLOOD RIGHT ARM    Special Requests      BOTTLES DRAWN AEROBIC AND ANAEROBIC Blood Culture adequate volume   Culture      NO GROWTH 5 DAYS Performed at [facility], [address] [place], [place] [address]    Report Status 11/21/2023 FINAL   Culture, blood (x 2)   Collection Time: 11/16/23  8:06 AM   Specimen: BLOOD LEFT ARM  Result Value Ref Range   Specimen Description BLOOD LEFT ARM    Special Requests      BOTTLES DRAWN AEROBIC AND ANAEROBIC Blood Culture adequate volume   Culture      NO GROWTH 5 DAYS Performed at [facility], [address] [place], [place] [address]    Report Status 11/21/2023 FINAL   Lactic acid, plasma   Collection Time: 11/16/23  8:06 AM  Result Value Ref Range   Lactic Acid, Venous 2.8 (HH) 0.5 - 1.9 mmol/L  Procalcitonin   Collection Time: 11/16/23  8:06 AM  Result Value Ref Range   Procalcitonin 0.24 ng/mL  Cortisol   Collection Time: 11/16/23  8:06 AM  Result Value Ref Range   Cortisol, Plasma 33.3 ug/dL  MRSA Next Gen by PCR, Nasal   Collection Time: 11/16/23  8:40 AM   Specimen: Nasal Mucosa; Nasal Swab  Result Value Ref Range   MRSA by PCR Next Gen DETECTED (A) NOT DETECTED  Hepatitis panel, acute   Collection Time: 11/16/23  9:03 PM  Result Value Ref Range   Hepatitis B Surface Ag NON REACTIVE NON REACTIVE   HCV Ab Reactive (A) NON REACTIVE   Hep A IgM NON REACTIVE NON REACTIVE   Hep B C IgM NON REACTIVE NON REACTIVE  HIV Antibody (routine testing w rflx)  Collection Time: 11/16/23  9:03 PM  Result Value Ref Range   HIV Screen 4th Generation wRfx Non Reactive Non Reactive   Lactic acid, plasma   Collection Time: 11/16/23  9:03 PM  Result Value Ref Range   Lactic Acid, Venous 1.9 0.5 - 1.9 mmol/L  Lactic acid, plasma   Collection Time: 11/16/23  9:55 PM  Result Value Ref Range   Lactic Acid, Venous 2.7 (HH) 0.5 - 1.9 mmol/L  Urinalysis, Routine w reflex microscopic -Urine, Clean Catch   Collection Time: 11/17/23 12:15 AM  Result Value Ref Range   Color, Urine YELLOW (A) YELLOW   APPearance CLEAR (A) CLEAR   Specific Gravity, Urine 1.025 1.005 - 1.030   pH 7.0 5.0 - 8.0   Glucose, UA NEGATIVE NEGATIVE mg/dL   Hgb urine dipstick NEGATIVE NEGATIVE   Bilirubin Urine NEGATIVE NEGATIVE   Ketones, ur 5 (A) NEGATIVE mg/dL   Protein, ur NEGATIVE NEGATIVE mg/dL   Nitrite NEGATIVE NEGATIVE   Leukocytes,Ua NEGATIVE NEGATIVE  Comprehensive metabolic panel   Collection Time: 11/17/23  1:07 AM  Result Value Ref Range   Sodium 135 135 - 145 mmol/L   Potassium 3.4 (L) 3.5 - 5.1 mmol/L   Chloride 98 98 - 111 mmol/L   CO2 26 22 - 32 mmol/L   Glucose, Bld 102 (H) 70 - 99 mg/dL   BUN 8 6 - 20 mg/dL   Creatinine, Ser 3.23 0.61 - 1.24 mg/dL   Calcium 9.0 8.9 - 55.7 mg/dL   Total Protein 6.7 6.5 - 8.1 g/dL   Albumin 3.6 3.5 - 5.0 g/dL   AST 65 (H) 15 - 41 U/L   ALT 103 (H) 0 - 44 U/L   Alkaline Phosphatase 41 38 - 126 U/L   Total Bilirubin 1.3 (H) <1.2 mg/dL   GFR, Estimated >32 >20 mL/min   Anion gap 11 5 - 15  CBC   Collection Time: 11/17/23  1:07 AM  Result Value Ref Range   WBC 17.3 (H) 4.0 - 10.5 K/uL   RBC 4.21 (L) 4.22 - 5.81 MIL/uL   Hemoglobin 14.1 13.0 - 17.0 g/dL   HCT 25.4 27.0 - 62.3 %   MCV 95.2 80.0 - 100.0 fL   MCH 33.5 26.0 - 34.0 pg   MCHC 35.2 30.0 - 36.0 g/dL   RDW 76.2 83.1 - 51.7 %   Platelets 179 150 - 400 K/uL   nRBC 0.0 0.0 - 0.2 %  CK   Collection Time: 11/17/23  1:07 AM  Result Value Ref Range   Total CK 85 49 - 397 U/L  Lactic acid, plasma   Collection Time: 11/17/23  1:07 AM  Result Value Ref Range   Lactic Acid, Venous  1.1 0.5 - 1.9 mmol/L  Procalcitonin   Collection Time: 11/17/23  1:07 AM  Result Value Ref Range   Procalcitonin 0.32 ng/mL  CBC   Collection Time: 11/18/23  8:32 AM  Result Value Ref Range   WBC 8.7 4.0 - 10.5 K/uL   RBC 4.69 4.22 - 5.81 MIL/uL   Hemoglobin 15.7 13.0 - 17.0 g/dL   HCT 61.6 07.3 - 71.0 %   MCV 95.3 80.0 - 100.0 fL   MCH 33.5 26.0 - 34.0 pg   MCHC 35.1 30.0 - 36.0 g/dL   RDW 62.6 (L) 94.8 - 54.6 %   Platelets 192 150 - 400 K/uL   nRBC 0.0 0.0 - 0.2 %  Basic metabolic panel   Collection Time: 11/18/23  8:32 AM  Result Value Ref Range   Sodium 136 135 - 145 mmol/L   Potassium 3.7 3.5 - 5.1 mmol/L   Chloride 101 98 - 111 mmol/L   CO2 27 22 - 32 mmol/L   Glucose, Bld 84 70 - 99 mg/dL   BUN 7 6 - 20 mg/dL   Creatinine, Ser 5.78 0.61 - 1.24 mg/dL   Calcium 9.2 8.9 - 46.9 mg/dL   GFR, Estimated >62 >95 mL/min   Anion gap 8 5 - 15   {Labs (Optional):23779}    Assessment & Plan:   Problem List Items Addressed This Visit   None    Assessment and Plan             Follow up plan: No follow-ups on file.

## 2024-02-26 ENCOUNTER — Emergency Department
Admission: EM | Admit: 2024-02-26 | Discharge: 2024-02-26 | Disposition: A | Discharge status: Home / Self Care | Payer: MEDICAID | Attending: Emergency Medicine | Admitting: Emergency Medicine

## 2024-02-26 ENCOUNTER — Other Ambulatory Visit: Payer: Self-pay

## 2024-02-26 DIAGNOSIS — M71011 Abscess of bursa, right shoulder: Secondary | ICD-10-CM | POA: Diagnosis present

## 2024-02-26 DIAGNOSIS — J45909 Unspecified asthma, uncomplicated: Secondary | ICD-10-CM | POA: Insufficient documentation

## 2024-02-26 DIAGNOSIS — L0291 Cutaneous abscess, unspecified: Secondary | ICD-10-CM

## 2024-02-26 LAB — COMPREHENSIVE METABOLIC PANEL
ALT: 189 U/L — ABNORMAL HIGH (ref 0–44)
AST: 202 U/L — ABNORMAL HIGH (ref 15–41)
Albumin: 4.3 g/dL (ref 3.5–5.0)
Alkaline Phosphatase: 61 U/L (ref 38–126)
Anion gap: 11 (ref 5–15)
BUN: 5 mg/dL — ABNORMAL LOW (ref 6–20)
CO2: 26 mmol/L (ref 22–32)
Calcium: 9.5 mg/dL (ref 8.9–10.3)
Chloride: 102 mmol/L (ref 98–111)
Creatinine, Ser: 0.7 mg/dL (ref 0.61–1.24)
GFR, Estimated: >60 mL/min (ref >60)
Glucose, Bld: 87 mg/dL (ref 70–99)
Potassium: 5 mmol/L (ref 3.5–5.1)
Sodium: 139 mmol/L (ref 135–145)
Total Bilirubin: 1.2 mg/dL (ref 0.0–1.2)
Total Protein: 8.7 g/dL — ABNORMAL HIGH (ref 6.5–8.1)

## 2024-02-26 LAB — CBC WITH DIFFERENTIAL/PLATELET
Abs Immature Granulocytes: 0.02 10*3/uL (ref 0.00–0.07)
Basophils Absolute: 0.1 10*3/uL (ref 0.0–0.1)
Basophils Relative: 1 %
Eosinophils Absolute: 0.1 10*3/uL (ref 0.0–0.5)
Eosinophils Relative: 1 %
HCT: 47.1 % (ref 39.0–52.0)
Hemoglobin: 16.2 g/dL (ref 13.0–17.0)
Immature Granulocytes: 0 %
Lymphocytes Relative: 23 %
Lymphs Abs: 2.3 10*3/uL (ref 0.7–4.0)
MCH: 33.2 pg (ref 26.0–34.0)
MCHC: 34.4 g/dL (ref 30.0–36.0)
MCV: 96.5 fL (ref 80.0–100.0)
Monocytes Absolute: 1.1 10*3/uL — ABNORMAL HIGH (ref 0.1–1.0)
Monocytes Relative: 11 %
Neutro Abs: 6.5 10*3/uL (ref 1.7–7.7)
Neutrophils Relative %: 64 %
Platelets: 271 10*3/uL (ref 150–400)
RBC: 4.88 MIL/uL (ref 4.22–5.81)
RDW: 11.4 % — ABNORMAL LOW (ref 11.5–15.5)
WBC: 10.2 10*3/uL (ref 4.0–10.5)
nRBC: 0 % (ref 0.0–0.2)

## 2024-02-26 LAB — LACTIC ACID, PLASMA: Lactic Acid, Venous: 1.4 mmol/L (ref 0.5–1.9)

## 2024-02-26 MED ORDER — LIDOCAINE-EPINEPHRINE (PF) 2 %-1:200000 IJ SOLN
10.0000 mL | Freq: Once | INTRAMUSCULAR | Status: AC
Start: 2024-02-26 — End: 2024-02-26
  Administered 2024-02-26: 10 mL
  Filled 2024-02-26: qty 20

## 2024-02-26 MED ORDER — DOXYCYCLINE HYCLATE 100 MG PO TABS: 100.0000 mg | ORAL_TABLET | Freq: Two times a day (BID) | ORAL | 0 refills | Status: DC

## 2024-02-26 MED ORDER — HYDROCODONE-ACETAMINOPHEN 5-325 MG PO TABS: 1.0000 | ORAL_TABLET | ORAL | 0 refills | Status: AC | PRN

## 2024-02-26 NOTE — ED Provider Notes (Signed)
 [facility] Emergency Department Provider Note     Event Date/Time   First MD Initiated Contact with Patient 02/26/24 1955     (approximate)   History   Abscess   HPI  Lawrence Griffith. is a 28 y.o. male with a history of asthma presents to the ED for evaluation of an abscess to his right shoulder blade.  Patient cannot recall how long the abscess has been there however patient states he noticed it when he could no longer lay down yesterday.  He denies fever and drainage.  Denies nausea and vomiting.    Physical Exam   Triage Vital Signs: ED Triage Vitals  Encounter Vitals Group     BP 02/26/24 1910 (!) 155/106     Systolic BP Percentile --      Diastolic BP Percentile --      Pulse Rate 02/26/24 1910 98     Resp 02/26/24 1910 16     Temp 02/26/24 1910 98.9 F (37.2 C)     Temp Source 02/26/24 1910 Oral     SpO2 02/26/24 1910 100 %     Weight 02/26/24 1915 200 lb (90.7 kg)     Height 02/26/24 1915 5\' 5"  (1.651 m)     Head Circumference --      Peak Flow --      Pain Score 02/26/24 1912 8     Pain Loc --      Pain Education --      Exclude from Growth Chart --     Most recent vital signs: Vitals:   02/26/24 1910  BP: (!) 155/106  Pulse: 98  Resp: 16  Temp: 98.9 F (37.2 C)  SpO2: 100%    General Awake, no distress.  HEENT NCAT.  CV:  Good peripheral perfusion.  RESP:  Normal effort.  ABD:  No distention.  Other:  ~ 5 cm x 3 cm abscess with surrounding cellulitis to the right shoulder blade area. Fluctuant center.    ED Results / Procedures / Treatments   Labs (all labs ordered are listed, but only abnormal results are displayed) Labs Reviewed  COMPREHENSIVE METABOLIC PANEL - Abnormal; Notable for the following components:      Result Value   BUN 5 (*)    Total Protein 8.7 (*)    AST 202 (*)    ALT 189 (*)    All other components within normal limits  CBC WITH DIFFERENTIAL/PLATELET - Abnormal; Notable for the  following components:   RDW 11.4 (*)    Monocytes Absolute 1.1 (*)    All other components within normal limits  LACTIC ACID, PLASMA  LACTIC ACID, PLASMA   No results found.  PROCEDURES:  Critical Care performed: No  .Incision and Drainage  Date/Time: 02/26/2024 10:10 PM  Performed by: Conrad Bryant, PA-C Authorized by: Conrad , PA-C   Consent:    Consent obtained:  Verbal   Consent given by:  Patient   Risks discussed:  Bleeding, incomplete drainage, pain and infection Universal protocol:    Patient identity confirmed:  Verbally with patient Location:    Type:  Abscess   Size:  5 cm   Location:  Trunk   Trunk location:  Back Pre-procedure details:    Skin preparation:  Povidone-iodine Sedation:    Sedation type:  None Anesthesia:    Anesthesia method:  Local infiltration   Local anesthetic:  Lidocaine 2% WITH epi Procedure type:  Complexity:  Complex Procedure details:    Incision types:  Stab incision   Incision depth:  Dermal   Wound management:  Probed and deloculated and irrigated with saline   Drainage:  Purulent   Drainage amount:  Copious   Wound treatment:  Wound left open   Packing materials:  1/4 in iodoform gauze   Amount 1/4" iodoform:  6 Post-procedure details:    Procedure completion:  Tolerated    MEDICATIONS ORDERED IN ED: Medications  lidocaine-EPINEPHrine (XYLOCAINE W/EPI) 2 %-1:200000 (PF) injection 10 mL (10 mLs Infiltration Given 02/26/24 2031)   IMPRESSION / MDM / ASSESSMENT AND PLAN / ED COURSE  I reviewed the triage vital signs and the nursing notes.                               28 y.o. male presents to the emergency department for evaluation and treatment of abscess. See HPI for further details.   Differential diagnosis includes, but is not limited to abscess, cyst, cellulitis  Patient's presentation is most consistent with acute complicated illness / injury requiring diagnostic workup.  Patient is alert and  oriented.  He is hemodynamically stable and afebrile.  Mildly elevated BP of 158/106.  This is consistent with his baseline.  Labs obtained from triage including lactic acid which is normal.  CMP noted for elevated liver enzymes and low BUN which is consistent with patient's trend.  CBC reassuring no leukocytosis.  Incision and drainage performed successfully.  Please see procedure note.  Packaging inserted in abscess cavity.  Patient is advised to return to ED in 2 days for packing removal and wound check.  He is placed on doxycycline given allergic to penicillin and sulfa.  ED return precautions thoroughly discussed.  Wound care instructions provided.  Patient is in stable condition for discharge home.    FINAL CLINICAL IMPRESSION(S) / ED DIAGNOSES   Final diagnoses:  Abscess    Rx / DC Orders   ED Discharge Orders          Ordered    doxycycline (VIBRA-TABS) 100 MG tablet  2 times daily        02/26/24 2147    HYDROcodone-acetaminophen (NORCO/VICODIN) 5-325 MG tablet  Every 4 hours PRN        02/26/24 2147             Note:  This document was prepared using Dragon voice recognition software and may include unintentional dictation errors.    Romeo Apple, Shabnam Ladd A, PA-C 02/26/24 2211    Dionne Bucy, MD 02/26/24 2332

## 2024-02-26 NOTE — Discharge Instructions (Addendum)
 Apply warm compresses over the area to help with pain.  Return to the ED or follow-up with your PCP in 2 days for wound recheck and package removal.  You have been prescribed antibiotics.  Please take this as instructed even if symptoms improve until dose is complete.  Keep area dry and clean.  You are encouraged to perform dressing changes twice a day.  Do not take Tylenol with pain medication prescribed.  Alternate with ibuprofen if needed.

## 2024-02-26 NOTE — ED Triage Notes (Signed)
 Pt arrived POV for abscess to right scapula, reports thinks it started as a small pimple then has grown over the past week. Pt has a golf ball size abscess that is slightly red, reports no drainage, pain worse when trying to lay on his back. Denies any fever or chills. Pt reports took 8 Ibuprofen 800 mg each 30 mins apart (6,400 mg in less then a few hours) PTA to help ease the pain "before the doctor trys to squeeze it"

## 2024-02-29 ENCOUNTER — Other Ambulatory Visit: Payer: Self-pay

## 2024-02-29 ENCOUNTER — Emergency Department
Admission: EM | Admit: 2024-02-29 | Discharge: 2024-02-29 | Disposition: A | Discharge status: Home / Self Care | Payer: MEDICAID | Attending: Emergency Medicine | Admitting: Emergency Medicine

## 2024-02-29 DIAGNOSIS — L02212 Cutaneous abscess of back [any part, except buttock]: Secondary | ICD-10-CM | POA: Diagnosis present

## 2024-02-29 DIAGNOSIS — R1110 Vomiting, unspecified: Secondary | ICD-10-CM | POA: Insufficient documentation

## 2024-02-29 MED ORDER — IBUPROFEN 600 MG PO TABS
600.0000 mg | ORAL_TABLET | Freq: Once | ORAL | Status: AC
Start: 2024-02-29 — End: 2024-02-29
  Administered 2024-02-29: 600 mg via ORAL
  Filled 2024-02-29: qty 1

## 2024-02-29 MED ORDER — IBUPROFEN 800 MG PO TABS: 800.0000 mg | ORAL_TABLET | Freq: Three times a day (TID) | ORAL | 0 refills | Status: DC | PRN

## 2024-02-29 MED ORDER — ONDANSETRON 4 MG PO TBDP: 4.0000 mg | ORAL_TABLET | Freq: Three times a day (TID) | ORAL | 0 refills | Status: DC | PRN

## 2024-02-29 NOTE — ED Triage Notes (Signed)
 Pt to ED via POV from home. Pt was seen here 3/16 for abscess on right scapula. Pt had wound packed and prescribed antibiotics. Pt told to come to ER or PCP in 2 days for follow up.   Pt reports needs antibiotics switched due to causing GI upset

## 2024-02-29 NOTE — ED Provider Notes (Signed)
 [facility] Provider Note    Event Date/Time   First MD Initiated Contact with Patient 02/29/24 2032     (approximate)   History   No chief complaint on file.   HPI  Lawrence A Leul Narramore. is a 28 y.o. male who presents today with recheck of abscess on his back.  Patient states having vomited when taking the antibiotic.  Patient started antibiotic yesterday.  Patient has drain pack.  Patient denies fever.     Physical Exam   Triage Vital Signs: ED Triage Vitals  Encounter Vitals Group     BP 02/29/24 1704 (!) 164/110     Systolic BP Percentile --      Diastolic BP Percentile --      Pulse Rate 02/29/24 1704 84     Resp 02/29/24 1704 20     Temp 02/29/24 1704 98 F (36.7 C)     Temp Source 02/29/24 1704 Oral     SpO2 02/29/24 1704 100 %     Weight --      Height --      Head Circumference --      Peak Flow --      Pain Score 02/29/24 1705 5     Pain Loc --      Pain Education --      Exclude from Growth Chart --     Most recent vital signs: Vitals:   02/29/24 1704 02/29/24 2000  BP: (!) 164/110 127/77  Pulse: 84 84  Resp: 20 18  Temp: 98 F (36.7 C)   SpO2: 100% 100%     Constitutional: Alert, NAD.  Eyes: Conjunctivae are normal.  Head: Atraumatic. Nose: No congestion/rhinnorhea. Mouth/Throat: Mucous membranes are moist.   Neck: Painless ROM. Supple. No JVD, nodes, thyromegaly  Cardiovascular:   Good peripheral circulation.RRR no murmurs, gallops, rubs  Respiratory: Normal respiratory effort.  No retractions. Clear to auscultation bilaterally without wheezing or crackles  Gastrointestinal: Soft and nontender.  Musculoskeletal:  no deformity Neurologic:  MAE spontaneously. No gross focal neurologic deficits are appreciated.  Skin:  Skin is warm, dry and intact. No rash noted. Right upper back: Removed the dressing, with presence of purulent secretions.  Change the dressing and let new drainage pack. Psychiatric: Mood and affect  are normal. Speech and behavior are normal.    ED Results / Procedures / Treatments   Labs (all labs ordered are listed, but only abnormal results are displayed) Labs Reviewed - No data to display   EKG    RADIOLOGY     PROCEDURES:  Critical Care performed:   Procedures   MEDICATIONS ORDERED IN ED: Medications  ibuprofen (ADVIL) tablet 600 mg (has no administration in time range)      IMPRESSION / MDM / ASSESSMENT AND PLAN / ED COURSE  I reviewed the triage vital signs and the nursing notes.  Differential diagnosis includes, but is not limited to, abscess, cellulitis, foreign body  Patient's presentation is most consistent with acute, uncomplicated illness.   Patient's diagnosis is consistent with abscess and resolution.  I did review the patient's allergies and medications.During admission patient received ibuprofen the patient is in stable and satisfactory condition for discharge home  Patient will be discharged home with prescriptions for Zofran, ibuprofen. Patient is to follow up with PCP as needed or otherwise directed. Patient is given ED precautions to return to the ED for any worsening or new symptoms. Discussed plan of care with patient, answered all  of patient's questions, Patient agreeable to plan of care. Advised patient to take medications according to the instructions on the label. Discussed possible side effects of new medications. Patient verbalized understanding.    FINAL CLINICAL IMPRESSION(S) / ED DIAGNOSES   Final diagnoses:  Abscess of back     Rx / DC Orders   ED Discharge Orders          Ordered    ondansetron (ZOFRAN-ODT) 4 MG disintegrating tablet  Every 8 hours PRN        02/29/24 2134    ibuprofen (ADVIL) 800 MG tablet  Every 8 hours PRN        02/29/24 2134             Note:  This document was prepared using Dragon voice recognition software and may include unintentional dictation errors.   Gladys Damme,  PA-C 02/29/24 2136    Minna Antis, MD 02/29/24 2316

## 2024-02-29 NOTE — Discharge Instructions (Signed)
 You have been diagnosed with a skin abscess in treatment.  Please continue taking doxycycline as directed, please take Zofran 20 minutes before main meals every 8 hours.  Please take doxycycline after breakfast and dinner.  This take ibuprofen 800 mg 1 tablet by mouth every 8 hours.  Please make an appointment with the pulm center to recheck the wound.

## 2024-02-29 NOTE — ED Notes (Signed)
 RN to bedside to introduce self to pt. Pt is caox4 in no acute distress.

## 2024-03-07 ENCOUNTER — Ambulatory Visit: Payer: MEDICAID | Admitting: Internal Medicine

## 2024-04-16 ENCOUNTER — Ambulatory Visit: Payer: MEDICAID | Admitting: Physician Assistant

## 2024-04-27 ENCOUNTER — Other Ambulatory Visit: Payer: Self-pay

## 2024-04-27 ENCOUNTER — Emergency Department
Admission: EM | Admit: 2024-04-27 | Discharge: 2024-04-27 | Disposition: A | Discharge status: Home / Self Care | Payer: MEDICAID | Attending: Emergency Medicine | Admitting: Emergency Medicine

## 2024-04-27 ENCOUNTER — Emergency Department: Payer: MEDICAID

## 2024-04-27 DIAGNOSIS — R1013 Epigastric pain: Secondary | ICD-10-CM | POA: Diagnosis not present

## 2024-04-27 DIAGNOSIS — F10230 Alcohol dependence with withdrawal, uncomplicated: Secondary | ICD-10-CM | POA: Insufficient documentation

## 2024-04-27 DIAGNOSIS — R112 Nausea with vomiting, unspecified: Secondary | ICD-10-CM | POA: Diagnosis present

## 2024-04-27 DIAGNOSIS — F10930 Alcohol use, unspecified with withdrawal, uncomplicated: Secondary | ICD-10-CM

## 2024-04-27 LAB — URINALYSIS, ROUTINE W REFLEX MICROSCOPIC
Glucose, UA: NEGATIVE mg/dL
Hgb urine dipstick: NEGATIVE
Ketones, ur: 5 mg/dL — AB
Leukocytes,Ua: NEGATIVE
Nitrite: NEGATIVE
Protein, ur: >=300 mg/dL — AB
Specific Gravity, Urine: 1.027 (ref 1.005–1.030)
pH: 8 (ref 5.0–8.0)

## 2024-04-27 LAB — COMPREHENSIVE METABOLIC PANEL WITH GFR
ALT: 116 U/L — ABNORMAL HIGH (ref 0–44)
AST: 115 U/L — ABNORMAL HIGH (ref 15–41)
Albumin: 4.8 g/dL (ref 3.5–5.0)
Alkaline Phosphatase: 60 U/L (ref 38–126)
Anion gap: 14 (ref 5–15)
BUN: 7 mg/dL (ref 6–20)
CO2: 25 mmol/L (ref 22–32)
Calcium: 10.1 mg/dL (ref 8.9–10.3)
Chloride: 100 mmol/L (ref 98–111)
Creatinine, Ser: 0.69 mg/dL (ref 0.61–1.24)
GFR, Estimated: >60 mL/min (ref >60)
Glucose, Bld: 117 mg/dL — ABNORMAL HIGH (ref 70–99)
Potassium: 4.3 mmol/L (ref 3.5–5.1)
Sodium: 139 mmol/L (ref 135–145)
Total Bilirubin: 1.7 mg/dL — ABNORMAL HIGH (ref 0.0–1.2)
Total Protein: 9.1 g/dL — ABNORMAL HIGH (ref 6.5–8.1)

## 2024-04-27 LAB — CBC
HCT: 46.4 % (ref 39.0–52.0)
Hemoglobin: 15.9 g/dL (ref 13.0–17.0)
MCH: 33.1 pg (ref 26.0–34.0)
MCHC: 34.3 g/dL (ref 30.0–36.0)
MCV: 96.7 fL (ref 80.0–100.0)
Platelets: 138 10*3/uL — ABNORMAL LOW (ref 150–400)
RBC: 4.8 MIL/uL (ref 4.22–5.81)
RDW: 11.8 % (ref 11.5–15.5)
WBC: 8.8 10*3/uL (ref 4.0–10.5)
nRBC: 0 % (ref 0.0–0.2)

## 2024-04-27 LAB — ETHANOL: Alcohol, Ethyl (B): <15 mg/dL (ref <15)

## 2024-04-27 LAB — LIPASE, BLOOD: Lipase: 47 U/L (ref 11–51)

## 2024-04-27 MED ORDER — SODIUM CHLORIDE 0.9 % IV BOLUS
1000.0000 mL | Freq: Once | INTRAVENOUS | Status: AC
Start: 2024-04-27 — End: 2024-04-27
  Administered 2024-04-27: 1000 mL via INTRAVENOUS

## 2024-04-27 MED ORDER — ONDANSETRON HCL 4 MG PO TABS: 4.0000 mg | ORAL_TABLET | Freq: Three times a day (TID) | ORAL | 0 refills | Status: DC | PRN

## 2024-04-27 MED ORDER — CHLORDIAZEPOXIDE HCL 25 MG PO CAPS: 25.0000 mg | ORAL_CAPSULE | Freq: Three times a day (TID) | ORAL | 0 refills | Status: AC | PRN

## 2024-04-27 MED ORDER — PHENOBARBITAL 32.4 MG PO TABS
64.8000 mg | ORAL_TABLET | Freq: Once | ORAL | Status: AC
  Administered 2024-04-27: 64.8 mg via ORAL
  Filled 2024-04-27: qty 2

## 2024-04-27 MED ORDER — ONDANSETRON HCL 4 MG/2ML IJ SOLN
4.0000 mg | Freq: Once | INTRAMUSCULAR | Status: AC
  Administered 2024-04-27: 4 mg via INTRAVENOUS
  Filled 2024-04-27: qty 2

## 2024-04-27 MED ORDER — LORAZEPAM 2 MG/ML IJ SOLN
1.0000 mg | Freq: Once | INTRAMUSCULAR | Status: AC
  Administered 2024-04-27: 1 mg via INTRAVENOUS
  Filled 2024-04-27: qty 1

## 2024-04-27 MED ORDER — IOHEXOL 300 MG/ML  SOLN
100.0000 mL | Freq: Once | INTRAMUSCULAR | Status: AC | PRN
  Administered 2024-04-27: 100 mL via INTRAVENOUS

## 2024-04-27 NOTE — ED Notes (Signed)
 Patient transported to CT

## 2024-04-27 NOTE — ED Triage Notes (Addendum)
 Pt comes with vomiting and belly pain. Pt states this started two days ago. Pt states he is throwing up yellow.  Pt also states he can't keep anything down. Pt shaking in triage and states that he drinks 15 cans beers daily but hasn't had any today.

## 2024-04-27 NOTE — Discharge Instructions (Signed)
 Please follow-up with your primary care provider as needed.  I have listed resources for you to follow-up with a rehab center for ongoing help with alcohol rehab.

## 2024-04-27 NOTE — ED Provider Notes (Signed)
[facility] Provider Note    Event Date/Time   First MD Initiated Contact with Patient 04/27/24 1107     (approximate)   History   Abdominal Pain   HPI Lawrence Griffith. is a 28 y.o. male with history of alcohol abuse presenting today for abdominal pain with vomiting.  Patient states for the past 2 days he has had bilateral abdominal pain associated with nausea and vomiting.  He believes the nausea and vomiting started first and was followed by the abdominal pain.  Intermittent pain with urination.  Denies any abdominal surgeries.  No fevers, chills, chest pain, shortness of breath, diarrhea, constipation.  Notes that he drinks anywhere between 15 and 30 beers per day and his last alcohol use was 3 days ago approximately 24 hours before this started.     Physical Exam   Triage Vital Signs: ED Triage Vitals  Encounter Vitals Group     BP 04/27/24 0910 (!) 160/106     Systolic BP Percentile --      Diastolic BP Percentile --      Pulse Rate 04/27/24 0910 (!) 110     Resp 04/27/24 0910 18     Temp 04/27/24 0910 98.3 F (36.8 C)     Temp src --      SpO2 04/27/24 0910 97 %     Weight 04/27/24 0909 200 lb (90.7 kg)     Height 04/27/24 0909 5\' 9"  (1.753 m)     Head Circumference --      Peak Flow --      Pain Score 04/27/24 0909 0     Pain Loc --      Pain Education --      Exclude from Growth Chart --     Most recent vital signs: Vitals:   04/27/24 1200 04/27/24 1230  BP: (!) 136/90 136/80  Pulse: 96 95  Resp: (!) 21 (!) 25  Temp:    SpO2: 96% 95%   Physical Exam: I have reviewed the vital signs and nursing notes. General: Awake, alert, no acute distress.  Nontoxic appearing. Head:  Atraumatic, normocephalic.   ENT:  EOM intact, PERRL. Oral mucosa is pink and moist with no lesions. Neck: Neck is supple with full range of motion, No meningeal signs. Cardiovascular:  RRR, No murmurs. Peripheral pulses palpable and equal  bilaterally. Respiratory:  Symmetrical chest wall expansion.  No rhonchi, rales, or wheezes.  Good air movement throughout.  No use of accessory muscles.   Musculoskeletal:  No cyanosis or edema. Moving extremities with full ROM Abdomen:  Soft, tenderness to palpation in bilateral upper quadrants and epigastric region, nondistended. Neuro:  GCS 15, moving all four extremities, interacting appropriately. Speech clear. Psych:  Calm, appropriate.   Skin:  Warm, dry, no rash.    ED Results / Procedures / Treatments   Labs (all labs ordered are listed, but only abnormal results are displayed) Labs Reviewed  COMPREHENSIVE METABOLIC PANEL WITH GFR - Abnormal; Notable for the following components:      Result Value   Glucose, Bld 117 (*)    Total Protein 9.1 (*)    AST 115 (*)    ALT 116 (*)    Total Bilirubin 1.7 (*)    All other components within normal limits  CBC - Abnormal; Notable for the following components:   Platelets 138 (*)    All other components within normal limits  URINALYSIS, ROUTINE W REFLEX MICROSCOPIC - Abnormal;  Notable for the following components:   Color, Urine AMBER (*)    APPearance HAZY (*)    Bilirubin Urine SMALL (*)    Ketones, ur 5 (*)    Protein, ur >=300 (*)    Bacteria, UA FEW (*)    All other components within normal limits  LIPASE, BLOOD  ETHANOL     EKG    RADIOLOGY Independently interpreted CT abdomen/pelvis with no acute pathology   PROCEDURES:  Critical Care performed: No  Procedures   MEDICATIONS ORDERED IN ED: Medications  PHENobarbital (LUMINAL) tablet 64.8 mg (has no administration in time range)  LORazepam  (ATIVAN ) injection 1 mg (1 mg Intravenous Given 04/27/24 1142)  sodium chloride  0.9 % bolus 1,000 mL (0 mLs Intravenous Stopped 04/27/24 1348)  ondansetron  (ZOFRAN ) injection 4 mg (4 mg Intravenous Given 04/27/24 1141)  iohexol  (OMNIPAQUE ) 300 MG/ML solution 100 mL (100 mLs Intravenous Contrast Given 04/27/24 1204)      IMPRESSION / MDM / ASSESSMENT AND PLAN / ED COURSE  I reviewed the triage vital signs and the nursing notes.                              Differential diagnosis includes, but is not limited to, alcohol withdrawal, viral GI infection, acute cystitis, cholecystitis, pancreatitis, chronic transaminitis  Patient's presentation is most consistent with acute complicated illness / injury requiring diagnostic workup.  Patient is a 28 year old male presenting today for 2 days of nausea with vomiting as well as bilateral abdominal pain.  Not specifically focal on exam but appears slightly worse on the right side and given his chronic transaminitis, will get CT imaging to rule out acute intra-abdominal pathology.  CBC and lipase otherwise normal.  Transaminitis does not seem as elevated as prior.  UA without signs of infection.  Highest concern may be from alcohol withdrawal as symptoms started 24 hours after his last drink.  Will give Ativan  and Zofran  along with 1 L of fluid while awaiting CT result.  CT abdomen/pelvis shows no acute pathology.  Patient's symptoms improved following Ativan  and able to tolerate p.o.  I suspect this is more related to alcohol withdrawal symptoms.  He is otherwise stable and safe for discharge with no ongoing symptoms.  Given one-time dose of phenobarb to help him going forward in the next 24 hours.  Sent home with a couple doses of as needed Librium as well as Zofran .  Given resources to help with outpatient rehab.  Given strict return precautions and he is agreeable with plan.  Clinical Course as of 04/27/24 1348  Fri Apr 27, 2024  1311 CT ABDOMEN PELVIS W CONTRAST negative [DW]    Clinical Course User Index [DW] Kandee Orion, MD     FINAL CLINICAL IMPRESSION(S) / ED DIAGNOSES   Final diagnoses:  Alcohol withdrawal syndrome without complication (HCC)     Rx / DC Orders   ED Discharge Orders          Ordered    chlordiazePOXIDE (LIBRIUM) 25 MG capsule   3 times daily PRN        04/27/24 1348    ondansetron  (ZOFRAN ) 4 MG tablet  Every 8 hours PRN        04/27/24 1348             Note:  This document was prepared using Dragon voice recognition software and may include unintentional dictation errors.   Kandee Orion, MD  04/27/24 1349  

## 2024-07-15 ENCOUNTER — Emergency Department
Admission: EM | Admit: 2024-07-15 | Discharge: 2024-07-15 | Disposition: A | Discharge status: Home / Self Care | Payer: MEDICAID | Attending: Emergency Medicine | Admitting: Emergency Medicine

## 2024-07-15 ENCOUNTER — Encounter: Payer: Self-pay | Admitting: Emergency Medicine

## 2024-07-15 ENCOUNTER — Other Ambulatory Visit: Payer: Self-pay

## 2024-07-15 DIAGNOSIS — R112 Nausea with vomiting, unspecified: Secondary | ICD-10-CM | POA: Diagnosis present

## 2024-07-15 DIAGNOSIS — F1090 Alcohol use, unspecified, uncomplicated: Secondary | ICD-10-CM | POA: Insufficient documentation

## 2024-07-15 DIAGNOSIS — I10 Essential (primary) hypertension: Secondary | ICD-10-CM | POA: Insufficient documentation

## 2024-07-15 MED ORDER — ONDANSETRON 4 MG PO TBDP
4.0000 mg | ORAL_TABLET | Freq: Once | ORAL | Status: AC
  Administered 2024-07-15: 4 mg via ORAL
  Filled 2024-07-15: qty 1

## 2024-07-15 MED ORDER — ONDANSETRON 4 MG PO TBDP: 4.0000 mg | ORAL_TABLET | Freq: Three times a day (TID) | ORAL | 0 refills | Status: DC | PRN

## 2024-07-15 NOTE — ED Triage Notes (Signed)
 BIBA:  Pt was at the [employer] eggs with his parents.  Pt stated he had been drinking at home and mom and dad drove him to [facility].  He at the eggs and started throwing up.  EMS stated that he was arguing with cook about poisoning him.  Called EMS

## 2024-07-15 NOTE — ED Provider Notes (Signed)
 [name] Provider Note    Event Date/Time   First MD Initiated Contact with Patient 07/15/24 [phone]     (approximate)   History   Emesis   HPI  Lawrence Griffith. is a 28 y.o. male with a history of polysubstance abuse who reports that he was drinking alcohol at home, went to [facility] and had some eggs and felt sick afterwards and vomited.  He thinks that he has been poisoned and apparently was arguing with chef.  He is feeling improved now     Physical Exam   Triage Vital Signs: ED Triage Vitals  Encounter Vitals Group     BP 07/15/24 0509 (!) 161/111     Girls Systolic BP Percentile --      Girls Diastolic BP Percentile --      Boys Systolic BP Percentile --      Boys Diastolic BP Percentile --      Pulse Rate 07/15/24 0509 87     Resp 07/15/24 0509 19     Temp 07/15/24 0509 98.3 F (36.8 C)     Temp Source 07/15/24 0509 Oral     SpO2 07/15/24 0509 99 %     Weight 07/15/24 0510 81.9 kg (180 lb 8.9 oz)     Height 07/15/24 0510 1.753 m (5' 9)     Head Circumference --      Peak Flow --      Pain Score 07/15/24 0509 7     Pain Loc --      Pain Education --      Exclude from Growth Chart --     Most recent vital signs: Vitals:   07/15/24 0509  BP: (!) 161/111  Pulse: 87  Resp: 19  Temp: 98.3 F (36.8 C)  SpO2: 99%     General: Awake, no distress.  CV:  Good peripheral perfusion.  Resp:  Normal effort.  Abd:  No distention.  Other:     ED Results / Procedures / Treatments   Labs (all labs ordered are listed, but only abnormal results are displayed) Labs Reviewed - No data to display   EKG     RADIOLOGY     PROCEDURES:  Critical Care performed:   Procedures   MEDICATIONS ORDERED IN ED: Medications  ondansetron  (ZOFRAN -ODT) disintegrating tablet 4 mg (4 mg Oral Given 07/15/24 0515)     IMPRESSION / MDM / ASSESSMENT AND PLAN / ED COURSE  I reviewed the triage vital signs and the nursing  notes. Patient's presentation is most consistent with acute illness / injury with system symptoms.  Patient presents after an episode of nausea and vomiting in the setting of alcohol use.  He is well-appearing and in no acute distress benign abdominal exam.  No indication for extensive workup will treat with ODT Zofran  and reevaluate   Patient reports his nausea is better, elevated blood pressure noted, he has a history of this, have asked him to follow-up for reevaluation with PCP.  No indication for admission at this time, appropriate for discharge with close outpatient follow-up        FINAL CLINICAL IMPRESSION(S) / ED DIAGNOSES   Final diagnoses:  Alcohol use  Nausea and vomiting, unspecified vomiting type  Uncontrolled hypertension     Rx / DC Orders   ED Discharge Orders          Ordered    Ambulatory Referral to Primary Care (Establish Care)  07/15/24 0527    ondansetron  (ZOFRAN -ODT) 4 MG disintegrating tablet  Every 8 hours PRN        07/15/24 9472             Note:  This document was prepared using Dragon voice recognition software and may include unintentional dictation errors.   Arlander Charleston, MD 07/15/24 [phone]

## 2024-08-04 ENCOUNTER — Other Ambulatory Visit: Payer: Self-pay

## 2024-08-04 ENCOUNTER — Encounter: Payer: Self-pay | Admitting: *Deleted

## 2024-08-04 DIAGNOSIS — Z87891 Personal history of nicotine dependence: Secondary | ICD-10-CM | POA: Diagnosis not present

## 2024-08-04 DIAGNOSIS — J45909 Unspecified asthma, uncomplicated: Secondary | ICD-10-CM | POA: Diagnosis not present

## 2024-08-04 DIAGNOSIS — F1090 Alcohol use, unspecified, uncomplicated: Secondary | ICD-10-CM | POA: Insufficient documentation

## 2024-08-04 DIAGNOSIS — R1110 Vomiting, unspecified: Principal | ICD-10-CM | POA: Insufficient documentation

## 2024-08-04 DIAGNOSIS — E876 Hypokalemia: Secondary | ICD-10-CM | POA: Insufficient documentation

## 2024-08-04 DIAGNOSIS — K7010 Alcoholic hepatitis without ascites: Secondary | ICD-10-CM | POA: Insufficient documentation

## 2024-08-04 LAB — URINALYSIS, ROUTINE W REFLEX MICROSCOPIC
Bacteria, UA: NONE SEEN
Glucose, UA: NEGATIVE mg/dL
Hgb urine dipstick: NEGATIVE
Ketones, ur: 20 mg/dL — AB
Leukocytes,Ua: NEGATIVE
Nitrite: NEGATIVE
Protein, ur: >=300 mg/dL — AB
Specific Gravity, Urine: 1.033 — ABNORMAL HIGH (ref 1.005–1.030)
pH: 5 (ref 5.0–8.0)

## 2024-08-04 MED ORDER — ONDANSETRON 4 MG PO TBDP
4.0000 mg | ORAL_TABLET | Freq: Once | ORAL | Status: AC | PRN
  Administered 2024-08-04: 4 mg via ORAL
  Filled 2024-08-04: qty 1

## 2024-08-04 NOTE — ED Triage Notes (Addendum)
 Pt says he has been vomiting since he woke up at 0700 this morning-vomits yellow emesis when he eats/drinks.  Generalized abdominal pain. Last ETOH 3 days ago- drinks approx 10 beers a day since he was 28 years old. Dry heaves in triage

## 2024-08-05 ENCOUNTER — Emergency Department: Payer: MEDICAID

## 2024-08-05 ENCOUNTER — Observation Stay
Admission: EM | Admit: 2024-08-05 | Discharge: 2024-08-06 | Disposition: A | Discharge status: Home / Self Care | Payer: MEDICAID | Attending: Internal Medicine | Admitting: Internal Medicine

## 2024-08-05 ENCOUNTER — Observation Stay: Payer: MEDICAID

## 2024-08-05 DIAGNOSIS — K7010 Alcoholic hepatitis without ascites: Secondary | ICD-10-CM

## 2024-08-05 DIAGNOSIS — F1490 Cocaine use, unspecified, uncomplicated: Secondary | ICD-10-CM | POA: Diagnosis not present

## 2024-08-05 DIAGNOSIS — F1020 Alcohol dependence, uncomplicated: Secondary | ICD-10-CM

## 2024-08-05 DIAGNOSIS — E876 Hypokalemia: Secondary | ICD-10-CM | POA: Diagnosis present

## 2024-08-05 DIAGNOSIS — F10239 Alcohol dependence with withdrawal, unspecified: Secondary | ICD-10-CM | POA: Diagnosis not present

## 2024-08-05 DIAGNOSIS — F1010 Alcohol abuse, uncomplicated: Secondary | ICD-10-CM | POA: Diagnosis not present

## 2024-08-05 DIAGNOSIS — R112 Nausea with vomiting, unspecified: Secondary | ICD-10-CM

## 2024-08-05 DIAGNOSIS — R569 Unspecified convulsions: Secondary | ICD-10-CM | POA: Diagnosis not present

## 2024-08-05 DIAGNOSIS — F10930 Alcohol use, unspecified with withdrawal, uncomplicated: Principal | ICD-10-CM

## 2024-08-05 DIAGNOSIS — F10939 Alcohol use, unspecified with withdrawal, unspecified: Secondary | ICD-10-CM

## 2024-08-05 DIAGNOSIS — B1920 Unspecified viral hepatitis C without hepatic coma: Secondary | ICD-10-CM

## 2024-08-05 HISTORY — DX: Unspecified convulsions: R56.9

## 2024-08-05 HISTORY — DX: Nausea with vomiting, unspecified: R11.2

## 2024-08-05 HISTORY — DX: Alcoholic hepatitis without ascites: K70.10

## 2024-08-05 LAB — CBC
HCT: 44.3 % (ref 39.0–52.0)
HCT: 40 % (ref 39.0–52.0)
Hemoglobin: 15.5 g/dL (ref 13.0–17.0)
Hemoglobin: 14.3 g/dL (ref 13.0–17.0)
MCH: 33.6 pg (ref 26.0–34.0)
MCH: 34.1 pg — ABNORMAL HIGH (ref 26.0–34.0)
MCHC: 35 g/dL (ref 30.0–36.0)
MCHC: 35.8 g/dL (ref 30.0–36.0)
MCV: 96.1 fL (ref 80.0–100.0)
MCV: 95.5 fL (ref 80.0–100.0)
Platelets: 95 K/uL — ABNORMAL LOW (ref 150–400)
Platelets: 83 K/uL — ABNORMAL LOW (ref 150–400)
RBC: 4.61 MIL/uL (ref 4.22–5.81)
RBC: 4.19 MIL/uL — ABNORMAL LOW (ref 4.22–5.81)
RDW: 11.7 % (ref 11.5–15.5)
RDW: 11.6 % (ref 11.5–15.5)
WBC: 3.9 K/uL — ABNORMAL LOW (ref 4.0–10.5)
WBC: 4.6 K/uL (ref 4.0–10.5)
nRBC: 0 % (ref 0.0–0.2)
nRBC: 0 % (ref 0.0–0.2)

## 2024-08-05 LAB — URINE DRUG SCREEN, QUALITATIVE (ARMC ONLY)
Amphetamines, Ur Screen: NOT DETECTED
Barbiturates, Ur Screen: NOT DETECTED
Benzodiazepine, Ur Scrn: POSITIVE — AB
Cannabinoid 50 Ng, Ur ~~LOC~~: POSITIVE — AB
Cocaine Metabolite,Ur ~~LOC~~: NOT DETECTED
MDMA (Ecstasy)Ur Screen: NOT DETECTED
Methadone Scn, Ur: NOT DETECTED
Opiate, Ur Screen: NOT DETECTED
Phencyclidine (PCP) Ur S: NOT DETECTED
Tricyclic, Ur Screen: NOT DETECTED

## 2024-08-05 LAB — GASTROINTESTINAL PANEL BY PCR, STOOL (REPLACES STOOL CULTURE)

## 2024-08-05 LAB — HEPATITIS PANEL, ACUTE
HCV Ab: REACTIVE — AB
Hep A IgM: NONREACTIVE
Hep B C IgM: NONREACTIVE
Hepatitis B Surface Ag: NONREACTIVE

## 2024-08-05 LAB — C DIFFICILE QUICK SCREEN W PCR REFLEX
C Diff antigen: NEGATIVE
C Diff interpretation: NOT DETECTED
C Diff toxin: NEGATIVE

## 2024-08-05 LAB — COMPREHENSIVE METABOLIC PANEL WITH GFR
ALT: 127 U/L — ABNORMAL HIGH (ref 0–44)
AST: 205 U/L — ABNORMAL HIGH (ref 15–41)
Albumin: 5 g/dL (ref 3.5–5.0)
Alkaline Phosphatase: 56 U/L (ref 38–126)
Anion gap: 17 — ABNORMAL HIGH (ref 5–15)
BUN: 10 mg/dL (ref 6–20)
CO2: 28 mmol/L (ref 22–32)
Calcium: 10.3 mg/dL (ref 8.9–10.3)
Chloride: 91 mmol/L — ABNORMAL LOW (ref 98–111)
Creatinine, Ser: 0.83 mg/dL (ref 0.61–1.24)
GFR, Estimated: >60 mL/min (ref >60)
Glucose, Bld: 129 mg/dL — ABNORMAL HIGH (ref 70–99)
Potassium: 3.6 mmol/L (ref 3.5–5.1)
Sodium: 136 mmol/L (ref 135–145)
Total Bilirubin: 3.4 mg/dL — ABNORMAL HIGH (ref 0.0–1.2)
Total Protein: 9.1 g/dL — ABNORMAL HIGH (ref 6.5–8.1)

## 2024-08-05 LAB — RESP PANEL BY RT-PCR (RSV, FLU A&B, COVID)  RVPGX2
Influenza A by PCR: NEGATIVE
Influenza B by PCR: NEGATIVE
Resp Syncytial Virus by PCR: NEGATIVE
SARS Coronavirus 2 by RT PCR: NEGATIVE

## 2024-08-05 LAB — MAGNESIUM: Magnesium: 2 mg/dL (ref 1.7–2.4)

## 2024-08-05 LAB — BASIC METABOLIC PANEL WITH GFR
Anion gap: 11 (ref 5–15)
BUN: 11 mg/dL (ref 6–20)
CO2: 28 mmol/L (ref 22–32)
Calcium: 9.4 mg/dL (ref 8.9–10.3)
Chloride: 98 mmol/L (ref 98–111)
Creatinine, Ser: 0.66 mg/dL (ref 0.61–1.24)
GFR, Estimated: >60 mL/min (ref >60)
Glucose, Bld: 91 mg/dL (ref 70–99)
Potassium: 3.2 mmol/L — ABNORMAL LOW (ref 3.5–5.1)
Sodium: 137 mmol/L (ref 135–145)

## 2024-08-05 LAB — LIPASE, BLOOD: Lipase: 47 U/L (ref 11–51)

## 2024-08-05 LAB — ETHANOL: Alcohol, Ethyl (B): <15 mg/dL (ref <15)

## 2024-08-05 MED ORDER — ONDANSETRON HCL 4 MG/2ML IJ SOLN
4.0000 mg | Freq: Once | INTRAMUSCULAR | Status: AC
  Administered 2024-08-05: 4 mg via INTRAVENOUS
  Filled 2024-08-05: qty 2

## 2024-08-05 MED ORDER — THIAMINE MONONITRATE 100 MG PO TABS: 100.0000 mg | ORAL_TABLET | Freq: Every day | ORAL | Status: DC

## 2024-08-05 MED ORDER — ADULT MULTIVITAMIN W/MINERALS CH
1.0000 | ORAL_TABLET | Freq: Every day | ORAL | Status: DC
  Administered 2024-08-05 – 2024-08-06 (×2): 1 via ORAL
  Filled 2024-08-05 (×2): qty 1

## 2024-08-05 MED ORDER — FOLIC ACID 1 MG PO TABS
1.0000 mg | ORAL_TABLET | Freq: Every day | ORAL | Status: DC
  Administered 2024-08-05 – 2024-08-06 (×2): 1 mg via ORAL
  Filled 2024-08-05 (×2): qty 1

## 2024-08-05 MED ORDER — MAGNESIUM HYDROXIDE 400 MG/5ML PO SUSP
30.0000 mL | Freq: Every day | ORAL | Status: DC | PRN
Start: 2024-08-05 — End: 2024-08-06

## 2024-08-05 MED ORDER — DIAZEPAM 5 MG PO TABS: 0.0000 mg | ORAL_TABLET | Freq: Four times a day (QID) | ORAL | Status: DC | PRN

## 2024-08-05 MED ORDER — DIAZEPAM 5 MG/ML IJ SOLN
10.0000 mg | Freq: Once | INTRAMUSCULAR | Status: AC
  Administered 2024-08-05: 10 mg via INTRAVENOUS
  Filled 2024-08-05: qty 2

## 2024-08-05 MED ORDER — THIAMINE HCL 100 MG/ML IJ SOLN
100.0000 mg | Freq: Every day | INTRAMUSCULAR | Status: DC
  Filled 2024-08-05: qty 2

## 2024-08-05 MED ORDER — MORPHINE SULFATE (PF) 2 MG/ML IV SOLN
2.0000 mg | INTRAVENOUS | Status: DC | PRN
  Administered 2024-08-05 – 2024-08-06 (×3): 2 mg via INTRAVENOUS
  Filled 2024-08-05 (×3): qty 1

## 2024-08-05 MED ORDER — ACETAMINOPHEN 325 MG PO TABS
650.0000 mg | ORAL_TABLET | Freq: Four times a day (QID) | ORAL | Status: DC | PRN
  Administered 2024-08-05 (×2): 650 mg via ORAL
  Filled 2024-08-05 (×2): qty 2

## 2024-08-05 MED ORDER — ONDANSETRON HCL 4 MG/2ML IJ SOLN
4.0000 mg | Freq: Four times a day (QID) | INTRAMUSCULAR | Status: DC | PRN
  Administered 2024-08-05 (×2): 4 mg via INTRAVENOUS
  Filled 2024-08-05 (×2): qty 2

## 2024-08-05 MED ORDER — DIAZEPAM 5 MG/ML IJ SOLN
INTRAMUSCULAR | Status: AC
  Filled 2024-08-05: qty 2

## 2024-08-05 MED ORDER — SODIUM CHLORIDE 0.9 % IV BOLUS
1000.0000 mL | Freq: Once | INTRAVENOUS | Status: AC
  Administered 2024-08-05: 1000 mL via INTRAVENOUS

## 2024-08-05 MED ORDER — IOHEXOL 300 MG/ML  SOLN
100.0000 mL | Freq: Once | INTRAMUSCULAR | Status: AC | PRN
  Administered 2024-08-05: 100 mL via INTRAVENOUS

## 2024-08-05 MED ORDER — ENOXAPARIN SODIUM 40 MG/0.4ML IJ SOSY
40.0000 mg | PREFILLED_SYRINGE | INTRAMUSCULAR | Status: DC
  Administered 2024-08-05 – 2024-08-06 (×2): 40 mg via SUBCUTANEOUS
  Filled 2024-08-05 (×2): qty 0.4

## 2024-08-05 MED ORDER — LORAZEPAM 2 MG PO TABS: 0.0000 mg | ORAL_TABLET | Freq: Two times a day (BID) | ORAL | Status: DC

## 2024-08-05 MED ORDER — LORAZEPAM 2 MG PO TABS: 0.0000 mg | ORAL_TABLET | Freq: Four times a day (QID) | ORAL | Status: DC

## 2024-08-05 MED ORDER — IPRATROPIUM-ALBUTEROL 0.5-2.5 (3) MG/3ML IN SOLN: 3.0000 mL | Freq: Four times a day (QID) | RESPIRATORY_TRACT | Status: DC | PRN

## 2024-08-05 MED ORDER — ONDANSETRON HCL 4 MG PO TABS
4.0000 mg | ORAL_TABLET | Freq: Four times a day (QID) | ORAL | Status: DC | PRN
Start: 2024-08-05 — End: 2024-08-06

## 2024-08-05 MED ORDER — DIAZEPAM 5 MG/ML IJ SOLN: 0.0000 mg | Freq: Four times a day (QID) | INTRAMUSCULAR | Status: DC | PRN

## 2024-08-05 MED ORDER — LORAZEPAM 2 MG/ML IJ SOLN: 0.0000 mg | Freq: Two times a day (BID) | INTRAMUSCULAR | Status: DC

## 2024-08-05 MED ORDER — TRAZODONE HCL 50 MG PO TABS
25.0000 mg | ORAL_TABLET | Freq: Every evening | ORAL | Status: DC | PRN
Start: 2024-08-05 — End: 2024-08-06
  Administered 2024-08-05: 25 mg via ORAL
  Filled 2024-08-05: qty 1

## 2024-08-05 MED ORDER — DIAZEPAM 5 MG/ML IJ SOLN
10.0000 mg | Freq: Once | INTRAMUSCULAR | Status: AC
  Administered 2024-08-05: 10 mg via INTRAVENOUS

## 2024-08-05 MED ORDER — SODIUM CHLORIDE 0.9 % IV SOLN: INTRAVENOUS | Status: AC

## 2024-08-05 MED ORDER — ACETAMINOPHEN 650 MG RE SUPP: 650.0000 mg | Freq: Four times a day (QID) | RECTAL | Status: DC | PRN

## 2024-08-05 MED ORDER — PANTOPRAZOLE SODIUM 40 MG IV SOLR
40.0000 mg | Freq: Two times a day (BID) | INTRAVENOUS | Status: DC
  Administered 2024-08-05 – 2024-08-06 (×3): 40 mg via INTRAVENOUS
  Filled 2024-08-05 (×3): qty 10

## 2024-08-05 MED ORDER — IPRATROPIUM-ALBUTEROL 0.5-2.5 (3) MG/3ML IN SOLN: 3.0000 mL | Freq: Four times a day (QID) | RESPIRATORY_TRACT | Status: DC

## 2024-08-05 MED ORDER — THIAMINE MONONITRATE 100 MG PO TABS
100.0000 mg | ORAL_TABLET | Freq: Every day | ORAL | Status: DC
  Administered 2024-08-05 – 2024-08-06 (×2): 100 mg via ORAL
  Filled 2024-08-05 (×2): qty 1

## 2024-08-05 MED ORDER — LORAZEPAM 2 MG/ML IJ SOLN: 0.0000 mg | Freq: Four times a day (QID) | INTRAMUSCULAR | Status: DC

## 2024-08-05 MED ORDER — POTASSIUM CHLORIDE CRYS ER 20 MEQ PO TBCR
40.0000 meq | EXTENDED_RELEASE_TABLET | Freq: Once | ORAL | Status: AC
  Administered 2024-08-05: 40 meq via ORAL
  Filled 2024-08-05: qty 2

## 2024-08-05 NOTE — Assessment & Plan Note (Addendum)
 Likely secondary to alcohol withdrawal, no prior history and CT head was negative for any acute abnormality. Neurology saw him and recommending reconsult if EEG is abnormal. -Continue to monitor - No need for any antiseizure medications at this time - EEG tomorrow morning - Checking UDS

## 2024-08-05 NOTE — ED Notes (Signed)
Fall bundle in place

## 2024-08-05 NOTE — ED Provider Notes (Signed)
 [facility] Provider Note    Event Date/Time   First MD Initiated Contact with Patient 08/05/24 [phone]     (approximate)   History   Emesis   HPI  Lawrence Griffith. is a 28 year old male with history of alcohol abuse presenting to the emergency department initially for evaluation of abdominal pain and vomiting.  In triage she reported that he had not had a drink for the last 3 days, typically drinks about 10 beers today.  While awaiting room placement, patient did have a witnessed episode of stiffening followed by generalized shaking lasting for about a minute after which she was brought back to a room.  On my initial evaluation, patient is somnolent but arousable.  Confirms to me that he has not had a drink in 3 days.  Does report generalized abdominal pain.  Denies fevers.  Denies prior history of seizures.     Physical Exam   Triage Vital Signs: ED Triage Vitals  Encounter Vitals Group     BP 08/04/24 2312 (!) 170/108     Girls Systolic BP Percentile --      Girls Diastolic BP Percentile --      Boys Systolic BP Percentile --      Boys Diastolic BP Percentile --      Pulse Rate 08/04/24 2312 93     Resp 08/04/24 2312 20     Temp 08/04/24 2312 98.2 F (36.8 C)     Temp Source 08/05/24 0100 Oral     SpO2 08/04/24 2312 97 %     Weight --      Height --      Head Circumference --      Peak Flow --      Pain Score 08/04/24 2309 7     Pain Loc --      Pain Education --      Exclude from Growth Chart --     Most recent vital signs: Vitals:   08/04/24 2312 08/05/24 0100  BP: (!) 170/108   Pulse: 93 (!) 104  Resp: 20 19  Temp: 98.2 F (36.8 C) 98.1 F (36.7 C)  SpO2: 97% 100%     General: Somnolent but arousable CV:  Tachycardic Resp:  Unlabored respirations Abd:  Nondistended, soft, mild generalized tenderness without rebound or guarding Neuro:  No facial asymmetry, moving extremity spontaneously, improving mental status on repeat  exams back to what I suspect is patient's baseline mental status   ED Results / Procedures / Treatments   Labs (all labs ordered are listed, but only abnormal results are displayed) Labs Reviewed  COMPREHENSIVE METABOLIC PANEL WITH GFR - Abnormal; Notable for the following components:      Result Value   Chloride 91 (*)    Glucose, Bld 129 (*)    Total Protein 9.1 (*)    AST 205 (*)    ALT 127 (*)    Total Bilirubin 3.4 (*)    Anion gap 17 (*)    All other components within normal limits  CBC - Abnormal; Notable for the following components:   WBC 3.9 (*)    Platelets 95 (*)    All other components within normal limits  URINALYSIS, ROUTINE W REFLEX MICROSCOPIC - Abnormal; Notable for the following components:   Color, Urine AMBER (*)    APPearance HAZY (*)    Specific Gravity, Urine 1.033 (*)    Bilirubin Urine MODERATE (*)    Ketones, ur 20 (*)  Protein, ur >=300 (*)    All other components within normal limits  ETHANOL  LIPASE, BLOOD     EKG EKG independently reviewed and interpreted by myself demonstrates:  EKG demonstrates sinus tachycardia at a rate of 108, PR 43, QRS 106, QTc 439, nonspecific ST changes  RADIOLOGY Imaging independently reviewed and interpreted by myself demonstrates:  CT head without acute bleed CT abdomen pelvis demonstrates findings concerning for cystitis though patient denying urinary symptoms and urinalysis here overall not suggestive of infection  Formal Radiology Read:  CT ABDOMEN PELVIS W CONTRAST Result Date: 08/05/2024 CLINICAL DATA:  Vomiting. EXAM: CT ABDOMEN AND PELVIS WITH CONTRAST TECHNIQUE: Multidetector CT imaging of the abdomen and pelvis was performed using the standard protocol following bolus administration of intravenous contrast. RADIATION DOSE REDUCTION: This exam was performed according to the departmental dose-optimization program which includes automated exposure control, adjustment of the mA and/or kV according to  patient size and/or use of iterative reconstruction technique. CONTRAST:  OMNIPAQUE  IOHEXOL  300 MG/ML  SOLN COMPARISON:  Apr 27, 2024 FINDINGS: Lower chest: Mild atelectatic changes are seen within the posterior aspect of the left lung base. Hepatobiliary: There is diffuse fatty infiltration of the liver parenchyma. No focal liver abnormality is seen. No gallstones, gallbladder wall thickening, or biliary dilatation. Pancreas: Unremarkable. No pancreatic ductal dilatation or surrounding inflammatory changes. Spleen: Normal in size without focal abnormality. Adrenals/Urinary Tract: Adrenal glands are unremarkable. Kidneys are normal, without renal calculi, focal lesion, or hydronephrosis. Marked severity anterior and lateral urinary bladder wall thickening is seen. This represents a new finding when compared to the prior study. Stomach/Bowel: Stomach is within normal limits. Appendix appears normal. No evidence of bowel wall thickening, distention, or inflammatory changes. Vascular/Lymphatic: No significant vascular findings are present. No enlarged abdominal or pelvic lymph nodes. Reproductive: Prostate is unremarkable. Other: No abdominal wall hernia or abnormality. No abdominopelvic ascites. Musculoskeletal: No acute or significant osseous findings. IMPRESSION: 1. Marked severity anterior and lateral urinary bladder wall thickening, which may represent sequelae associated with acute cystitis. Correlation with urinalysis is recommended. 2. Hepatic steatosis. Electronically Signed   By: Suzen Dials M.D.   On: 08/05/2024 03:06   CT Head Wo Contrast Result Date: 08/05/2024 CLINICAL DATA:  Seizure, new-onset, no history of trauma EXAM: CT HEAD WITHOUT CONTRAST TECHNIQUE: Contiguous axial images were obtained from the base of the skull through the vertex without intravenous contrast. RADIATION DOSE REDUCTION: This exam was performed according to the departmental dose-optimization program which includes  automated exposure control, adjustment of the mA and/or kV according to patient size and/or use of iterative reconstruction technique. COMPARISON:  CT head 11/16/2023. FINDINGS: Brain: No evidence of acute infarction, hemorrhage, hydrocephalus, extra-axial collection or mass lesion/mass effect. Vascular: No hyperdense vessel or unexpected calcification. Skull: No acute fracture. Sinuses/Orbits: Clear sinuses.  No acute orbital findings. Other: No mastoid effusions. IMPRESSION: No evidence of acute intracranial abnormality. Electronically Signed   By: Gilmore GORMAN Molt M.D.   On: 08/05/2024 02:37    PROCEDURES:  Critical Care performed: Yes, see critical care procedure note(s)  CRITICAL CARE Performed by: Nilsa Dade   Total critical care time: 32 minutes  Critical care time was exclusive of separately billable procedures and treating other patients.  Critical care was necessary to treat or prevent imminent or life-threatening deterioration.  Critical care was time spent personally by me on the following activities: development of treatment plan with patient and/or surrogate as well as nursing, discussions with consultants, evaluation of patient's response  to treatment, examination of patient, obtaining history from patient or surrogate, ordering and performing treatments and interventions, ordering and review of laboratory studies, ordering and review of radiographic studies, pulse oximetry and re-evaluation of patient's condition.   Procedures   MEDICATIONS ORDERED IN ED: Medications  LORazepam  (ATIVAN ) injection 0-4 mg ( Intravenous Not Given 08/05/24 0117)    Or  LORazepam  (ATIVAN ) tablet 0-4 mg ( Oral See Alternative 08/05/24 0117)  LORazepam  (ATIVAN ) injection 0-4 mg (has no administration in time range)    Or  LORazepam  (ATIVAN ) tablet 0-4 mg (has no administration in time range)  thiamine  (VITAMIN B1) tablet 100 mg (has no administration in time range)    Or  thiamine  (VITAMIN B1)  injection 100 mg (has no administration in time range)  diazepam  (VALIUM ) injection 10 mg (has no administration in time range)  ondansetron  (ZOFRAN -ODT) disintegrating tablet 4 mg (4 mg Oral Given 08/04/24 2320)  diazepam  (VALIUM ) injection 10 mg (10 mg Intravenous Given 08/05/24 0107)  iohexol  (OMNIPAQUE ) 300 MG/ML solution 100 mL (100 mLs Intravenous Contrast Given 08/05/24 0148)  diazepam  (VALIUM ) injection 10 mg (10 mg Intravenous Given 08/05/24 0333)  sodium chloride  0.9 % bolus 1,000 mL (1,000 mLs Intravenous New Bag/Given 08/05/24 0344)  ondansetron  (ZOFRAN ) injection 4 mg (4 mg Intravenous Given 08/05/24 0342)     IMPRESSION / MDM / ASSESSMENT AND PLAN / ED COURSE  I reviewed the triage vital signs and the nursing notes.  Differential diagnosis includes, but is not limited to, alcohol withdrawal seizure, intracranial bleed, primary seizure disorder, electrolyte abnormality  Patient's presentation is most consistent with acute presentation with potential threat to life or bodily function.  28 year old male who presented initially for vomiting and abdominal pain found to have witnessed seizure-like episode in triage concerning for alcohol withdrawal seizure with clinical history.  Tachycardic on my initial evaluation, hypertensive.  No ongoing seizure activity on my initial evaluation, but with concern for alcohol withdrawal seizure was ordered for 10 of IV Valium .  Labs with CBC without significant derangement.  CMP with transaminitis with AST to ALT ratio suggestive of alcoholic liver dysfunction.  Normal lipase and EtOH.  With this, strong suspicion for withdrawal seizure.  CT head obtained without acute findings.  With abdominal pain and vomiting, CT abdomen pelvis was obtained with radiographic findings concerning for cystitis though urinalysis and clinical history not suggestive of this.  Patient with initial improvement after receiving Valium , did have recurrent withdrawal symptoms as  below.  Clinical Course as of 08/05/24 0451  Sun Aug 05, 2024  0336 Notified by RN that patient was vomiting, tachycardic, appeared uncomfortable.  Went to reassess patient at bedside.  He was noted to have tachycardia up to the 160s but downtrending as the patient calm down to the low 100s.  He was tremulous on exam with tongue fasciculations.  Hypertensive.  Presentation seems consistent with worsening alcohol withdrawal.  Will give another dose of Valium  here.  If he remains with persistent alcohol withdrawal symptoms, consideration for phenobarbital  for better symptom control.  CTs without acute findings, plan for admission when patient further stabilized. [NR]  0400 Patient reassessed.  Significant improvement in heart rate to the 80s, blood pressure dropped to systolics in the 120s.  Patient looks more comfortable but continues to have some tremulousness with tongue fasciculations.  Will give an additional dose of Valium , but do think he is stable for admission at this time.  Will reach out to hospitalist team. [NR]  [phone] Case reviewed  with Dr. Lawence.  He will evaluate for anticipated admission. [NR]    Clinical Course User Index [NR] Levander Slate, MD     FINAL CLINICAL IMPRESSION(S) / ED DIAGNOSES   Final diagnoses:  Alcohol withdrawal seizure without complication (HCC)  Nausea and vomiting, unspecified vomiting type     Rx / DC Orders   ED Discharge Orders     None        Note:  This document was prepared using Dragon voice recognition software and may include unintentional dictation errors.   Levander Slate, MD 08/05/24 [phone]

## 2024-08-05 NOTE — Assessment & Plan Note (Signed)
 Mild hyponatremia with potassium at 3.2, normal magnesium .  Likely due to GI losses. - Replace potassium and monitor

## 2024-08-05 NOTE — ED Notes (Signed)
 CCMD contacted and pt placed on monitor at this time.

## 2024-08-05 NOTE — Hospital Course (Addendum)
 Partly taken from H&P.  Lawrence Griffith. is a 28 y.o. male with medical history significant for asthma, hepatitis C and polysubstance abuse including alcohol abuse, who presented to the emergency room with acute onset of left-sided upper abdominal pain and recurrent nausea and vomiting.  While waiting in ED patient had a brief episode of seizure, he was somnolent afterwards but easily arousable.  No history of any seizure disorder.  Patient drinks alcohol on a daily basis and last drink was about 3 days ago, he did not drink because of ongoing nausea and vomiting.  No fever or chills.  On presentation patient has elevated blood pressure at 170/108, otherwise stable vital, labs with transaminitis and elevated T. bili which seems acute on chronic, mild leukopenia at 3.9, thrombocytopenia at 95.  UA with ketones and protein urea.  Alcohol levels were less than 15.  Lipase was normal at 47. CT abdomen and pelvis with moderate severity anterior and lateral urinary bladder wall thickening which may represent sequelae associated with acute cystitis.  Also shows hepatic steatosis. CT head was negative for any acute abnormality  Patient received IV fluid, Zofran , IV Valium  and started on CIWA protocol.  Neurology was consulted for concern of new onset seizure, likely due to alcohol withdrawal.  8/24: Vital stable, complaining of diarrhea and altered taste this morning, COVID PCR was negative.  C. difficile negative, GI pathogen panel pending.  Ordered acute hepatitis panel due to slight worsening of transaminitis and T. bili.  Leukopenia improved with slight worsening of thrombocytopenia with platelet at 83, potassium of 3.2 with normal magnesium . UDS positive for cannabinoid and benzo-patient received Valium .  8/25: Vital stable.  Diarrhea resolved.  GI pathogen panel was negative.  Acute hepatitis panel positive for hep C antibody, it was positive before but patient did not know about it.  Quantitative  assay was ordered and in process with pending results.  Patient was given referral to see outpatient infectious disease for further evaluation and treatment.  EEG was negative for any seizure-like activity.  He was given referral for outpatient neurology and advised not to drive until cleared.  Patient was not started on any seizure medication and did not had any more episodes either.    Patient will continue on current medications and need to have a close follow-up with his providers for further assistance. Patient was provided with resources for alcohol and substance abuse.  Counseling was done and will need continuation of counseling as outpatient.

## 2024-08-05 NOTE — Assessment & Plan Note (Signed)
Counseling was provided. -CIWA protocol 

## 2024-08-05 NOTE — H&P (Addendum)
 Lawrence Griffith   PATIENT NAME: Lawrence Griffith    MR#:  [id number]  DATE OF BIRTH:  08-Apr-1996  DATE OF ADMISSION:  08/05/2024  PRIMARY CARE PHYSICIAN: Pcp, No   Patient is coming from: Home  REQUESTING/REFERRING PHYSICIAN: Levander Slate, MD  CHIEF COMPLAINT:   Chief Complaint  Patient presents with   Emesis    HISTORY OF PRESENT ILLNESS:  Lawrence Caba. is a 28 y.o. male with medical history significant for asthma, hepatitis C and polysubstance abuse including alcohol abuse, who presented to the emergency room with acute onset of left-sided upper abdominal pain and recurrent nausea and vomiting.  While the patient was in triage he admitted not having an alcoholic drink in the last 3 days while his normal is about 10 beers, per day.  While he was awaiting placement he had a witnessed episode with stiffness followed by generalized shaking lasting about a minute after which he was brought to the room and was somnolent but arousable.  He reported generalized abdominal pain without diarrhea or melena or bright red in per rectum.  No fever or chills.  No previous history of seizures.  No paresthesias or focal muscle weakness.  No headache or dizziness or blurred vision.  No dysuria, oliguria or hematuria or flank pain.  No chest pain or palpitations.  He has been having cough occasionally productive of clear and occasionally yellow sputum with no wheezing or dyspnea.   ED Course: When the patient came to the ER, BP was 170/108 with otherwise normal vital signs.  Labs revealed a chloride level of 91 with anion gap of 17.  AST was 205 and ALT 127 with total protein of 9.1.  Total bili was 3.4.  CBC showed mild leukopenia of 3.9 and thrombocytopenia of 95.  UA showed more than 300 protein and 20 ketones with high specific gravity 1033.  Alcohol level was less than 15..  Lipase was normal at 47. EKG as reviewed by me :  EKG showed sinus tachycardia with a rate of 108 with right atrial  enlargement and left axis deviation with RSR-. Imaging: Abdominal pelvic CT scan with contrast revealed the following: 1. Marked severity anterior and lateral urinary bladder wall thickening, which may represent sequelae associated with acute cystitis. Correlation with urinalysis is recommended. 2. Hepatic steatosis.  The patient was given 1 L bolus of IV normal saline, 4 mg of IV Zofran , 10 mg of IV Valium  X3 and later placed on CIWA protocol.  He will be admitted to a medical telemetry observation bed for further evaluation and management. PAST MEDICAL HISTORY:   Past Medical History:  Diagnosis Date   Asthma     PAST SURGICAL HISTORY:   Past Surgical History:  Procedure Laterality Date   ORIF TIBIA PLATEAU Left 05/13/2020   Procedure: OPEN REDUCTION INTERNAL FIXATION (ORIF) OF LEFT LATERAL TIBIAL PLATEAU FRACTURE;  Surgeon: Tobie Priest, MD;  Location: [facility] ORS;  Service: Orthopedics;  Laterality: Left;    SOCIAL HISTORY:   Social History   Tobacco Use   Smoking status: Never   Smokeless tobacco: Current    Types: Chew  Substance Use Topics   Alcohol use: Yes    Alcohol/week: 6.0 standard drinks of alcohol    Types: 6 Cans of beer per week    Comment: 15 cans beer a day    FAMILY HISTORY:   Positive for cancer.  DRUG ALLERGIES:   Allergies  Allergen Reactions   Clarithromycin  Other reaction(s): HIVES   Penicillamine Hives   Penicillins Hives    Has patient had a PCN reaction causing immediate rash, facial/tongue/throat swelling, SOB or lightheadedness with hypotension: Yes Has patient had a PCN reaction causing severe rash involving mucus membranes or skin necrosis: No Has patient had a PCN reaction that required hospitalization No Has patient had a PCN reaction occurring within the last 10 years: Yes If all of the above answers are NO, then may proceed with Cephalosporin use.   Sulfa Antibiotics     Other reaction(s): HIVES    REVIEW OF SYSTEMS:    ROS As per history of present illness. All pertinent systems were reviewed above. Constitutional, HEENT, cardiovascular, respiratory, GI, GU, musculoskeletal, neuro, psychiatric, endocrine, integumentary and hematologic systems were reviewed and are otherwise negative/unremarkable except for positive findings mentioned above in the HPI.   MEDICATIONS AT HOME:   Prior to Admission medications   Medication Sig Start Date End Date Taking? Authorizing Provider  doxycycline  (VIBRA -TABS) 100 MG tablet Take 1 tablet (100 mg total) by mouth 2 (two) times daily. 02/26/24   Margrette, Myah A, PA-C  folic acid  (FOLVITE ) 1 MG tablet Take 1 tablet (1 mg total) by mouth daily. 11/19/23   Josette Ade, MD  ibuprofen  (ADVIL ) 800 MG tablet Take 1 tablet (800 mg total) by mouth every 8 (eight) hours as needed. 02/29/24   Evans, Alexandra, PA-C  Multiple Vitamin (MULTIVITAMIN WITH MINERALS) TABS tablet Take 1 tablet by mouth daily. 11/19/23   Josette Ade, MD  ondansetron  (ZOFRAN ) 4 MG tablet Take 1 tablet (4 mg total) by mouth every 8 (eight) hours as needed. 04/27/24 04/27/25  Malvina Alm DASEN, MD  ondansetron  (ZOFRAN -ODT) 4 MG disintegrating tablet Take 1 tablet (4 mg total) by mouth every 8 (eight) hours as needed for nausea or vomiting. 07/15/24   Arlander Charleston, MD  thiamine  (VITAMIN B-1) 100 MG tablet Take 1 tablet (100 mg total) by mouth daily. 11/19/23   Josette Ade, MD      VITAL SIGNS:  Blood pressure 133/82, pulse 89, temperature 99.1 F (37.3 C), temperature source Oral, resp. rate 17, SpO2 93%.  PHYSICAL EXAMINATION:  Physical Exam  GENERAL:  28 y.o.-year-old Caucasian patient lying in the bed with no acute distress.  EYES: Pupils equal, round, reactive to light and accommodation. No scleral icterus. Extraocular muscles intact.  HEENT: Head atraumatic, normocephalic. Oropharynx and nasopharynx clear.  NECK:  Supple, no jugular venous distention. No thyroid enlargement, no tenderness.   LUNGS: Normal breath sounds bilaterally, no wheezing, rales,rhonchi or crepitation. No use of accessory muscles of respiration.  CARDIOVASCULAR: Regular rate and rhythm, S1, S2 normal. No murmurs, rubs, or gallops.  ABDOMEN: Soft, nondistended, nontender. Bowel sounds present. No organomegaly or mass.  EXTREMITIES: No pedal edema, cyanosis, or clubbing.  NEUROLOGIC: Cranial nerves II through XII are intact. Muscle strength 5/5 in all extremities. Sensation intact. Gait not checked.  PSYCHIATRIC: The patient is alert and oriented x 3.  Normal affect and good eye contact. SKIN: No obvious rash, lesion, or ulcer.   LABORATORY PANEL:   CBC Recent Labs  Lab 08/04/24 2315  WBC 3.9*  HGB 15.5  HCT 44.3  PLT 95*   ------------------------------------------------------------------------------------------------------------------  Chemistries  Recent Labs  Lab 08/04/24 2315  NA 136  K 3.6  CL 91*  CO2 28  GLUCOSE 129*  BUN 10  CREATININE 0.83  CALCIUM 10.3  AST 205*  ALT 127*  ALKPHOS 56  BILITOT 3.4*   ------------------------------------------------------------------------------------------------------------------  Cardiac Enzymes No results for input(s): TROPONINI in the last 168 hours. ------------------------------------------------------------------------------------------------------------------  RADIOLOGY:  CT ABDOMEN PELVIS W CONTRAST Result Date: 08/05/2024 CLINICAL DATA:  Vomiting. EXAM: CT ABDOMEN AND PELVIS WITH CONTRAST TECHNIQUE: Multidetector CT imaging of the abdomen and pelvis was performed using the standard protocol following bolus administration of intravenous contrast. RADIATION DOSE REDUCTION: This exam was performed according to the departmental dose-optimization program which includes automated exposure control, adjustment of the mA and/or kV according to patient size and/or use of iterative reconstruction technique. CONTRAST:  OMNIPAQUE  IOHEXOL   300 MG/ML  SOLN COMPARISON:  Apr 27, 2024 FINDINGS: Lower chest: Mild atelectatic changes are seen within the posterior aspect of the left lung base. Hepatobiliary: There is diffuse fatty infiltration of the liver parenchyma. No focal liver abnormality is seen. No gallstones, gallbladder wall thickening, or biliary dilatation. Pancreas: Unremarkable. No pancreatic ductal dilatation or surrounding inflammatory changes. Spleen: Normal in size without focal abnormality. Adrenals/Urinary Tract: Adrenal glands are unremarkable. Kidneys are normal, without renal calculi, focal lesion, or hydronephrosis. Marked severity anterior and lateral urinary bladder wall thickening is seen. This represents a new finding when compared to the prior study. Stomach/Bowel: Stomach is within normal limits. Appendix appears normal. No evidence of bowel wall thickening, distention, or inflammatory changes. Vascular/Lymphatic: No significant vascular findings are present. No enlarged abdominal or pelvic lymph nodes. Reproductive: Prostate is unremarkable. Other: No abdominal wall hernia or abnormality. No abdominopelvic ascites. Musculoskeletal: No acute or significant osseous findings. IMPRESSION: 1. Marked severity anterior and lateral urinary bladder wall thickening, which may represent sequelae associated with acute cystitis. Correlation with urinalysis is recommended. 2. Hepatic steatosis. Electronically Signed   By: Suzen Dials M.D.   On: 08/05/2024 03:06   CT Head Wo Contrast Result Date: 08/05/2024 CLINICAL DATA:  Seizure, new-onset, no history of trauma EXAM: CT HEAD WITHOUT CONTRAST TECHNIQUE: Contiguous axial images were obtained from the base of the skull through the vertex without intravenous contrast. RADIATION DOSE REDUCTION: This exam was performed according to the departmental dose-optimization program which includes automated exposure control, adjustment of the mA and/or kV according to patient size and/or use of  iterative reconstruction technique. COMPARISON:  CT head 11/16/2023. FINDINGS: Brain: No evidence of acute infarction, hemorrhage, hydrocephalus, extra-axial collection or mass lesion/mass effect. Vascular: No hyperdense vessel or unexpected calcification. Skull: No acute fracture. Sinuses/Orbits: Clear sinuses.  No acute orbital findings. Other: No mastoid effusions. IMPRESSION: No evidence of acute intracranial abnormality. Electronically Signed   By: Gilmore GORMAN Molt M.D.   On: 08/05/2024 02:37      IMPRESSION AND PLAN:  Assessment and Plan: * Alcohol withdrawal seizure (HCC) - The patient will be admitted to a medical telemetry observation bed. - He will be placed on seizure precautions. - EEG and neurology consult will be obtained. - Dr. Voncile was notified about the patient. - The patient will be placed on as needed IV Ativan  for alcohol withdrawal per CIWA protocol.  Ativan  will be replaced with IV Valium  per formulary due to national shortage of Ativan .   Intractable nausea and vomiting - The patient will be placed on as needed antiemetics and hydration with IV normal saline. - Will place on IV PPI therapy with Protonix . - Will obtain an abdominal and pelvic CT scan. - His lipase was normal.  Alcoholic hepatitis - He has elevated transaminases and total bili. - This could be related to alcoholic hepatitis and post appetite CC cirrhosis. - Abdominal and pelvic CT scan will be obtained  as mentioned above.  Will plan right upper quadrant ultrasound.  Alcohol abuse - She was counseled for cessation.   DVT prophylaxis: Lovenox .  Advanced Care Planning:  Code Status: full code.  Family Communication:  The plan of care was discussed in details with the patient (and family). I answered all questions. The patient agreed to proceed with the above mentioned plan. Further management will depend upon hospital course. Disposition Plan: Back to previous home environment Consults called:  Neurology All the records are reviewed and case discussed with ED provider.  Status is: Observation  I certify that at the time of admission, it is my clinical judgment that the patient will require  hospital care extending less than 2 midnights.                            Dispo: The patient is from: Home              Anticipated d/c is to: Home              Patient currently is not medically stable to d/c.              Difficult to place patient: No  Madison DELENA Peaches M.D on 08/05/2024 at 5:19 AM  [facility]   From 7 PM-7 AM, contact night-coverage [email or web address]  CC: Primary care physician; Pcp, No

## 2024-08-05 NOTE — Assessment & Plan Note (Addendum)
-   He has elevated transaminases and total bili. - This could be related to alcoholic hepatitis - Abdominal and pelvic CT scan was with hepatic steatosis. RUQ ultrasound with diffuse echogenic liver parenchyma suggest fatty deposition, no evidence of cholelithiasis or biliary dilatation. - Check acute hepatitis panel -Supportive care

## 2024-08-05 NOTE — ED Notes (Signed)
 This RN received pt into ED room 17 after pt was witnessed having seizure like episode in ED lobby. Pt is alert upon arrival into room. Seizure pads placed onto bed rails. Pt noted to be alert only to self at this time. Pt reports he has not drank alcohol in 3 days.

## 2024-08-05 NOTE — ED Notes (Signed)
 Patients mother Tammy contacted and updated on the status of pt per his request.

## 2024-08-05 NOTE — ED Notes (Signed)
 This RN to pts bedside. Pt noted to have episode of emesis and noted to be tachycardiac at 150's. Pt appears to be diaphoretic. Verbal order from ED provider to give 10 of Valium .

## 2024-08-05 NOTE — Plan of Care (Signed)
   Problem: Clinical Measurements: Goal: Ability to maintain clinical measurements within normal limits will improve Outcome: Progressing

## 2024-08-05 NOTE — ED Notes (Signed)
 ED Provider at bedside on arrival to treatment room 17, pt placed on the monitor. IV established.

## 2024-08-05 NOTE — ED Notes (Signed)
 Report called and given to charge nurse on 1A.

## 2024-08-05 NOTE — Consult Note (Signed)
 NEUROLOGY CONSULT NOTE   Date of service: August 05, 2024 Patient Name: Lawrence Griffith. MRN:  [id number] DOB:  1996-12-12 Chief Complaint: Seizure Requesting Provider: Caleen Qualia, MD  History of Present Illness  Jacquis A Karey Stucki. is a 28 y.o. male with hx of alcohol abuse, presented to the ED for abdominal pain and vomiting when while waiting for evaluation, had witnessed episode of whole body shaking and stiffening that lasted for about a minute.  He was somnolent after this episode but arousable.  Reported generalized abdominal pain.  Reported lasting 3 days prior to presentation-drinks about 10 beers a day.  He reports he had another seizure while on the floor.  He remembers shaking but does not remember what brought it on and what happened right after.  No tongue bite or urinary incontinence reported Alcohol level undetectable in the ER. Transaminitis with AST more than ALT elevation in line with the alcohol use.  CBC also showed mild leukopenia and thrombocytopenia. Stat CT head was unremarkable for acute process Neurology was consulted for further recommendations   ROS  Comprehensive ROS performed and pertinent positives documented in HPI   Past History   Past Medical History:  Diagnosis Date   Asthma     Past Surgical History:  Procedure Laterality Date   ORIF TIBIA PLATEAU Left 05/13/2020   Procedure: OPEN REDUCTION INTERNAL FIXATION (ORIF) OF LEFT LATERAL TIBIAL PLATEAU FRACTURE;  Surgeon: Tobie Priest, MD;  Location: [facility] ORS;  Service: Orthopedics;  Laterality: Left;    Family History: History reviewed. No pertinent family history.  Social History  reports that he has never smoked. His smokeless tobacco use includes chew. He reports current alcohol use of about 6.0 standard drinks of alcohol per week. He reports current drug use. Frequency: 7.00 times per week. Drug: Marijuana.  Allergies  Allergen Reactions   Clarithromycin     Other reaction(s): HIVES    Penicillamine Hives   Penicillins Hives    Has patient had a PCN reaction causing immediate rash, facial/tongue/throat swelling, SOB or lightheadedness with hypotension: Yes Has patient had a PCN reaction causing severe rash involving mucus membranes or skin necrosis: No Has patient had a PCN reaction that required hospitalization No Has patient had a PCN reaction occurring within the last 10 years: Yes If all of the above answers are NO, then may proceed with Cephalosporin use.   Sulfa Antibiotics     Other reaction(s): HIVES    Medications   Current Facility-Administered Medications:    0.9 %  sodium chloride  infusion, , Intravenous, Continuous, Mansy, Jan A, MD, Last Rate: 100 mL/hr at 08/05/24 0543, New Bag at 08/05/24 0543   acetaminophen  (TYLENOL ) tablet 650 mg, 650 mg, Oral, Q6H PRN, 650 mg at 08/05/24 0932 **OR** acetaminophen  (TYLENOL ) suppository 650 mg, 650 mg, Rectal, Q6H PRN, Mansy, Jan A, MD   diazepam  (VALIUM ) tablet 0-20 mg, 0-20 mg, Oral, Q6H PRN **OR** diazepam  (VALIUM ) injection 0-20 mg, 0-20 mg, Intravenous, Q6H PRN, Mansy, Jan A, MD   enoxaparin  (LOVENOX ) injection 40 mg, 40 mg, Subcutaneous, Q24H, Mansy, Jan A, MD, 40 mg at 08/05/24 9080   folic acid  (FOLVITE ) tablet 1 mg, 1 mg, Oral, Daily, Mansy, Jan A, MD, 1 mg at 08/05/24 9080   ipratropium-albuterol  (DUONEB) 0.5-2.5 (3) MG/3ML nebulizer solution 3 mL, 3 mL, Nebulization, Q6H PRN, Mansy, Jan A, MD   magnesium  hydroxide (MILK OF MAGNESIA) suspension 30 mL, 30 mL, Oral, Daily PRN, Mansy, Madison DELENA, MD   morphine  (PF)  2 MG/ML injection 2 mg, 2 mg, Intravenous, Q4H PRN, Mansy, Jan A, MD, 2 mg at 08/05/24 9372   multivitamin with minerals tablet 1 tablet, 1 tablet, Oral, Daily, Mansy, Jan A, MD, 1 tablet at 08/05/24 9080   ondansetron  (ZOFRAN ) tablet 4 mg, 4 mg, Oral, Q6H PRN **OR** ondansetron  (ZOFRAN ) injection 4 mg, 4 mg, Intravenous, Q6H PRN, Mansy, Jan A, MD, 4 mg at 08/05/24 0919   pantoprazole  (PROTONIX ) injection  40 mg, 40 mg, Intravenous, Q12H, Mansy, Jan A, MD, 40 mg at 08/05/24 9080   thiamine  (VITAMIN B1) tablet 100 mg, 100 mg, Oral, Daily, 100 mg at 08/05/24 0919 **OR** thiamine  (VITAMIN B1) injection 100 mg, 100 mg, Intravenous, Daily, Ray, Neha, MD   traZODone  (DESYREL ) tablet 25 mg, 25 mg, Oral, QHS PRN, Mansy, Jan A, MD  Vitals   Vitals:   08/05/24 0509 08/05/24 0535 08/05/24 0700 08/05/24 0754  BP:  (!) 144/100  137/83  Pulse:  90  100  Resp:  18  18  Temp: 99.1 F (37.3 C) 99.1 F (37.3 C)  97.8 F (36.6 C)  TempSrc: Oral Oral    SpO2:  100%  98%  Weight:   79.9 kg   Height:   5' 9 (1.753 m)     Body mass index is 26.01 kg/m.   Physical Exam  General: Awake alert in no distress HEENT: Normocephalic atraumatic Lungs are clear Cardiovascular: Regular rhythm Neurological exam Awake alert oriented x 3.  No dysarthria.  No aphasia. Cranial nerves II to XII intact Motor examination with no drift Sensation intact light touch Coordination examination reveals mild tremulousness all over but no gross dysmetria. Gait testing was deferred   Labs/Imaging/Neurodiagnostic studies   CBC:  Recent Labs  Lab 15-Aug-2024 2315 08/05/24 0549  WBC 3.9* 4.6  HGB 15.5 14.3  HCT 44.3 40.0  MCV 96.1 95.5  PLT 95* 83*   Basic Metabolic Panel:  Lab Results  Component Value Date   NA 137 08/05/2024   K 3.2 (L) 08/05/2024   CO2 28 08/05/2024   GLUCOSE 91 08/05/2024   BUN 11 08/05/2024   CREATININE 0.66 08/05/2024   CALCIUM 9.4 08/05/2024   GFRNONAA >60 08/05/2024   GFRAA >60 06/28/2016   Urine Drug Screen:     Component Value Date/Time   LABOPIA NONE DETECTED 11/16/2023 0220   COCAINSCRNUR POSITIVE (A) 11/16/2023 0220   LABBENZ NONE DETECTED 11/16/2023 0220   AMPHETMU NONE DETECTED 11/16/2023 0220   THCU POSITIVE (A) 11/16/2023 0220   LABBARB NONE DETECTED 11/16/2023 0220    Alcohol Level     Component Value Date/Time   [facility] <15 08-15-2024 2315    CT Head without  contrast(Personally reviewed): No acute changes   ASSESSMENT   Zeno A Isiaha Greenup. is a 28 y.o. male past history of alcohol abuse presenting for evaluation of abdominal discomfort and pain and while being evaluated in the ER had a seizure.  Last drink was 2 to 3 days ago.  UDS also positive for THC and cocaine. Suspect for this being an alcohol withdrawal seizure versus seizure related to substance use.Exam is back to baseline. No prior history of seizures He is going to be staying in the hospital for his abdominal pain workup.  I recommend getting an EEG tomorrow but I do not think any further workup is needed unless he has a recurrent event  Impression: Likely provoked seizure in the setting of alcohol withdrawal and cocaine use   RECOMMENDATIONS  Routine  EEG-please call back neurology if concerning abnormalities. No antiseizure medications for now Will need seizure precautions as below including no driving for 6 months per state law once ready for discharge. CIWA protocol while in the hospital  Please call back neurology with questions as needed Plan discussed with Dr. Caleen ______________________________________________________________________  Signed, Eligio Lav, MD [facility]   SEIZURE PRECAUTIONS Per [place]  DMV statutes, patients with seizures are not allowed to drive until they have been seizure-free for six months.   Use caution when using heavy equipment or power tools. Avoid working on ladders or at heights. Take showers instead of baths. Ensure the water temperature is not too high on the home water heater. Do not go swimming alone. Do not lock yourself in a room alone (i.e. bathroom). When caring for infants or small children, sit down when holding, feeding, or changing them to minimize risk of injury to the child in the event you have a seizure. Maintain good sleep hygiene. Avoid alcohol.    If patient has another seizure, call 911 and bring them  back to the ED if: A.  The seizure lasts longer than 5 minutes.      B.  The patient doesn't wake shortly after the seizure or has new problems such as difficulty seeing, speaking or moving following the seizure C.  The patient was injured during the seizure D.  The patient has a temperature over 102 F (39C) E.  The patient vomited during the seizure and now is having trouble breathing

## 2024-08-05 NOTE — Progress Notes (Signed)
 Progress Note   Patient: Lawrence Griffith. FMW:[id number] DOB: 05-05-1996 DOA: 08/05/2024     0 DOS: the patient was seen and examined on 08/05/2024   Brief hospital course: Partly taken from H&P.  Lawrence Griffith. is a 28 y.o. male with medical history significant for asthma, hepatitis C and polysubstance abuse including alcohol abuse, who presented to the emergency room with acute onset of left-sided upper abdominal pain and recurrent nausea and vomiting.  While waiting in ED patient had a brief episode of seizure, he was somnolent afterwards but easily arousable.  No history of any seizure disorder.  Patient drinks alcohol on a daily basis and last drink was about 3 days ago, he did not drink because of ongoing nausea and vomiting.  No fever or chills.  On presentation patient has elevated blood pressure at 170/108, otherwise stable vital, labs with transaminitis and elevated T. bili which seems acute on chronic, mild leukopenia at 3.9, thrombocytopenia at 95.  UA with ketones and protein urea.  Alcohol levels were less than 15.  Lipase was normal at 47. CT abdomen and pelvis with moderate severity anterior and lateral urinary bladder wall thickening which may represent sequelae associated with acute cystitis.  Also shows hepatic steatosis. CT head was negative for any acute abnormality  Patient received IV fluid, Zofran , IV Valium  and started on CIWA protocol.  Neurology was consulted for concern of new onset seizure, likely due to alcohol withdrawal.  8/24: Vital stable, complaining of diarrhea and altered taste this morning, COVID PCR was negative.  C. difficile negative, GI pathogen panel pending.  Ordered acute hepatitis panel due to slight worsening of transaminitis and T. bili.  Leukopenia improved with slight worsening of thrombocytopenia with platelet at 83, potassium of 3.2 with normal magnesium .  Assessment and Plan: * Alcohol withdrawal seizure (HCC) Likely secondary to  alcohol withdrawal, no prior history and CT head was negative for any acute abnormality. Neurology saw him and recommending reconsult if EEG is abnormal. -Continue to monitor - No need for any antiseizure medications at this time - EEG tomorrow morning - Checking UDS   Intractable nausea and vomiting Likely some viral gastroenteritis, not developed diarrhea, appetite remained poor and complaining of altered taste so COVID was checked and it was negative.  C. difficile PCR negative.  GI pathogen panel pending. - Supportive care  Alcohol abuse Counseling was provided. -CIWA protocol  Alcoholic hepatitis - He has elevated transaminases and total bili. - This could be related to alcoholic hepatitis - Abdominal and pelvic CT scan was with hepatic steatosis. RUQ ultrasound with diffuse echogenic liver parenchyma suggest fatty deposition, no evidence of cholelithiasis or biliary dilatation. - Check acute hepatitis panel -Supportive care  Hypokalemia Mild hyponatremia with potassium at 3.2, normal magnesium .  Likely due to GI losses. - Replace potassium and monitor   Subjective: Patient was seen and examined today.  Appetite remained poor, no more vomiting but now he developed diarrhea.  Still having some abdominal pain.  Also complaining of altered taste sensation.  No upper respiratory symptoms.  Physical Exam: Vitals:   08/05/24 0535 08/05/24 0700 08/05/24 0754 08/05/24 1221  BP: (!) 144/100  137/83 135/85  Pulse: 90  100 82  Resp: 18  18 18   Temp: 99.1 F (37.3 C)  97.8 F (36.6 C) 99.9 F (37.7 C)  TempSrc: Oral   Oral  SpO2: 100%  98% 98%  Weight:  79.9 kg    Height:  5' 9 (  1.753 m)     General.  Well-developed gentleman, in no acute distress. Pulmonary.  Lungs clear bilaterally, normal respiratory effort. CV.  Regular rate and rhythm, no JVD, rub or murmur. Abdomen.  Soft, nontender, nondistended, BS positive. CNS.  Alert and oriented .  No focal neurologic  deficit. Extremities.  No edema, no cyanosis, pulses intact and symmetrical. Psychiatry.  Judgment and insight appears normal.   Data Reviewed: Prior data reviewed  Family Communication: Discussed with patient  Disposition: Status is: Observation The patient remains OBS appropriate and will d/c before 2 midnights.  Planned Discharge Destination: Home  DVT prophylaxis.  Lovenox  Time spent:  minutes  This record has been created using Conservation officer, historic buildings. Errors have been sought and corrected,but may not always be located. Such creation errors do not reflect on the standard of care.   Author: Amaryllis Dare, MD 08/05/2024 1:27 PM  For on call review [email or web address].

## 2024-08-05 NOTE — Assessment & Plan Note (Addendum)
 Likely some viral gastroenteritis, not developed diarrhea, appetite remained poor and complaining of altered taste so COVID was checked and it was negative.  C. difficile PCR negative.  GI pathogen panel pending. - Supportive care

## 2024-08-05 NOTE — Assessment & Plan Note (Addendum)
Counseling was provided. -CIWA protocol 

## 2024-08-06 ENCOUNTER — Observation Stay: Payer: MEDICAID

## 2024-08-06 DIAGNOSIS — R112 Nausea with vomiting, unspecified: Secondary | ICD-10-CM | POA: Diagnosis not present

## 2024-08-06 DIAGNOSIS — K7010 Alcoholic hepatitis without ascites: Secondary | ICD-10-CM | POA: Diagnosis not present

## 2024-08-06 DIAGNOSIS — F1010 Alcohol abuse, uncomplicated: Secondary | ICD-10-CM | POA: Diagnosis not present

## 2024-08-06 DIAGNOSIS — R569 Unspecified convulsions: Secondary | ICD-10-CM | POA: Diagnosis not present

## 2024-08-06 DIAGNOSIS — E876 Hypokalemia: Secondary | ICD-10-CM | POA: Diagnosis not present

## 2024-08-06 LAB — CBC
HCT: 41.2 % (ref 39.0–52.0)
Hemoglobin: 14.5 g/dL (ref 13.0–17.0)
MCH: 34 pg (ref 26.0–34.0)
MCHC: 35.2 g/dL (ref 30.0–36.0)
MCV: 96.5 fL (ref 80.0–100.0)
Platelets: 70 K/uL — ABNORMAL LOW (ref 150–400)
RBC: 4.27 MIL/uL (ref 4.22–5.81)
RDW: 11.5 % (ref 11.5–15.5)
WBC: 4.1 K/uL (ref 4.0–10.5)
nRBC: 0 % (ref 0.0–0.2)

## 2024-08-06 LAB — COMPREHENSIVE METABOLIC PANEL WITH GFR
ALT: 85 U/L — ABNORMAL HIGH (ref 0–44)
AST: 131 U/L — ABNORMAL HIGH (ref 15–41)
Albumin: 3.9 g/dL (ref 3.5–5.0)
Alkaline Phosphatase: 44 U/L (ref 38–126)
Anion gap: 9 (ref 5–15)
BUN: 6 mg/dL (ref 6–20)
CO2: 27 mmol/L (ref 22–32)
Calcium: 9.3 mg/dL (ref 8.9–10.3)
Chloride: 97 mmol/L — ABNORMAL LOW (ref 98–111)
Creatinine, Ser: 0.68 mg/dL (ref 0.61–1.24)
GFR, Estimated: >60 mL/min (ref >60)
Glucose, Bld: 84 mg/dL (ref 70–99)
Potassium: 3.4 mmol/L — ABNORMAL LOW (ref 3.5–5.1)
Sodium: 133 mmol/L — ABNORMAL LOW (ref 135–145)
Total Bilirubin: 2.5 mg/dL — ABNORMAL HIGH (ref 0.0–1.2)
Total Protein: 7.5 g/dL (ref 6.5–8.1)

## 2024-08-06 NOTE — TOC Initial Note (Signed)
 Transition of Care (TOC) - Progression Note    Patient Details  Name: Lawrence Griffith. MRN: [id number] Date of Birth: 05/28/1996  Transition of Care [facility]) CM/SW Contact  [place] L Kymberlie Braze[place], [place] Phone Number: 08/06/2024, 12:47 PM  Clinical Narrative:      CSW met with patient at bedside. Substance Abuse resources provided to patient. Patient minimizes his situation. CSW attempted to advise patient that he could alleviate some of his issues with housing if he entered a rehab facility to address substance abuse. Patient declined. He stated that he was admitted because he fell but that is not accurate. Patient is not transparent about his situation.   Patient advised that sometimes he and his relatives get rooms at hotels so they are not always sleeping in the car. Resources uploaded into the AVS.                    Expected Discharge Plan and Services                                               Social Drivers of Health (SDOH) Interventions SDOH Screenings   Food Insecurity: No Food Insecurity (08/05/2024)  Housing: High Risk (08/05/2024)  Transportation Needs: No Transportation Needs (08/05/2024)  Utilities: At Risk (08/05/2024)  Tobacco Use: High Risk (08/04/2024)    Readmission Risk Interventions     No data to display

## 2024-08-06 NOTE — Progress Notes (Signed)
 Eeg done

## 2024-08-06 NOTE — Discharge Summary (Signed)
 Physician Discharge Summary   Patient: Lawrence Griffith. MRN: [id number] DOB: [birth year]  Admit date:     08/05/2024  Discharge date: 08/06/24  Discharge Physician: Amaryllis Dare   PCP: Pcp, No   Recommendations at discharge:  Please follow-up on quantitative RNA for hepatitis C Please continue counseling for alcohol cessation Follow-up with primary care provider for Follow-up with infectious disease-hepatitis C clinic Follow-up with neurology  Discharge Diagnoses: Principal Problem:   Alcohol withdrawal seizure (HCC) Active Problems:   Nausea and vomiting   Alcohol abuse   Alcoholic hepatitis   Hypokalemia   Hospital Course: Partly taken from H&P.  Lawrence Griffith. is a 28 y.o. male with medical history significant for asthma, hepatitis C and polysubstance abuse including alcohol abuse, who presented to the emergency room with acute onset of left-sided upper abdominal pain and recurrent nausea and vomiting.  While waiting in ED patient had a brief episode of seizure, he was somnolent afterwards but easily arousable.  No history of any seizure disorder.  Patient drinks alcohol on a daily basis and last drink was about 3 days ago, he did not drink because of ongoing nausea and vomiting.  No fever or chills.  On presentation patient has elevated blood pressure at 170/108, otherwise stable vital, labs with transaminitis and elevated T. bili which seems acute on chronic, mild leukopenia at 3.9, thrombocytopenia at 95.  UA with ketones and protein urea.  Alcohol levels were less than 15.  Lipase was normal at 47. CT abdomen and pelvis with moderate severity anterior and lateral urinary bladder wall thickening which may represent sequelae associated with acute cystitis.  Also shows hepatic steatosis. CT head was negative for any acute abnormality  Patient received IV fluid, Zofran , IV Valium  and started on CIWA protocol.  Neurology was consulted for concern of new onset seizure,  likely due to alcohol withdrawal.  8/24: Vital stable, complaining of diarrhea and altered taste this morning, COVID PCR was negative.  C. difficile negative, GI pathogen panel pending.  Ordered acute hepatitis panel due to slight worsening of transaminitis and T. bili.  Leukopenia improved with slight worsening of thrombocytopenia with platelet at 83, potassium of 3.2 with normal magnesium . UDS positive for cannabinoid and benzo-patient received Valium .  8/25: Vital stable.  Diarrhea resolved.  GI pathogen panel was negative.  Acute hepatitis panel positive for hep C antibody, it was positive before but patient did not know about it.  Quantitative assay was ordered and in process with pending results.  Patient was given referral to see outpatient infectious disease for further evaluation and treatment.  EEG was negative for any seizure-like activity.  He was given referral for outpatient neurology and advised not to drive until cleared.  Patient was not started on any seizure medication and did not had any more episodes either.    Patient will continue on current medications and need to have a close follow-up with his providers for further assistance. Patient was provided with resources for alcohol and substance abuse.  Counseling was done and will need continuation of counseling as outpatient.  Assessment and Plan: * Alcohol withdrawal seizure (HCC) Likely secondary to alcohol withdrawal, no prior history and CT head was negative for any acute abnormality. Neurology saw him and recommending reconsult if EEG is abnormal.  UDS was positive for cannabinoid and benzos-patient did receive benzos in the ED.  Prior UDS positive for cocaine. No need to start any antiseizure medications at this time as seizure is  likely due to alcohol withdrawal EEG was negative for any seizure-like activity.  Nausea and vomiting Likely some viral gastroenteritis, not developed diarrhea, appetite remained poor and  complaining of altered taste so COVID was checked and it was negative.  C. difficile PCR negative.  GI pathogen panel negative Symptoms resolved  Alcohol abuse Counseling was provided. -CIWA protocol  Alcoholic hepatitis - He has elevated transaminases and total bili. - This could be related to alcoholic hepatitis - Abdominal and pelvic CT scan was with hepatic steatosis. RUQ ultrasound with diffuse echogenic liver parenchyma suggest fatty deposition, no evidence of cholelithiasis or biliary dilatation. - Acute hepatitis panel positive for hep C antibody. Quantitative assay was done and patient was given referral for infectious disease.  Hypokalemia Mild hyponatremia with potassium at 3.4, normal magnesium .  Likely due to GI losses. - Replace potassium and monitor  Consultants: Neurology Procedures performed: EEG Disposition: Home Diet recommendation:  Discharge Diet Orders (From admission, onward)     Start     Ordered   08/06/24 0000  Diet - low sodium heart healthy        08/06/24 1453           Regular diet DISCHARGE MEDICATION: Allergies as of 08/06/2024       Reactions   Clarithromycin    Other reaction(s): HIVES   Penicillamine Hives   Penicillins Hives   Has patient had a PCN reaction causing immediate rash, facial/tongue/throat swelling, SOB or lightheadedness with hypotension: Yes Has patient had a PCN reaction causing severe rash involving mucus membranes or skin necrosis: No Has patient had a PCN reaction that required hospitalization No Has patient had a PCN reaction occurring within the last 10 years: Yes If all of the above answers are NO, then may proceed with Cephalosporin use.   Sulfa Antibiotics    Other reaction(s): HIVES        Medication List     STOP taking these medications    doxycycline  100 MG tablet Commonly known as: VIBRA -TABS   ondansetron  4 MG tablet Commonly known as: Zofran        TAKE these medications    folic  acid 1 MG tablet Commonly known as: FOLVITE  Take 1 tablet (1 mg total) by mouth daily.   ibuprofen  800 MG tablet Commonly known as: ADVIL  Take 1 tablet (800 mg total) by mouth every 8 (eight) hours as needed.   multivitamin with minerals Tabs tablet Take 1 tablet by mouth daily.   ondansetron  4 MG disintegrating tablet Commonly known as: ZOFRAN -ODT Take 1 tablet (4 mg total) by mouth every 8 (eight) hours as needed for nausea or vomiting.   thiamine  100 MG tablet Commonly known as: Vitamin B-1 Take 1 tablet (100 mg total) by mouth daily.        Follow-up Information     Sharma Coyer, MD. Go on 08/23/2024.   Specialty: Family Medicine Why: Your NEW PATIENT APPOINTMENT is scheduled for August 23, 2024 at 2:20p. Please arrive at 2pm to complete paperwork, etc. Contact information: [address] [place] [address] [phone]         Fayette Bodily, MD. Schedule an appointment as soon as possible for a visit in 1 week(s).   Specialty: Infectious Diseases Why: Hepatitis C antibody positive, RNA results are pending Contact information: [address] [place] [address] [phone]         Maree Jannett POUR, MD. Schedule an appointment as soon as possible for a visit.  Specialty: Neurology Why: New onset seizure, likely alcohol withdrawal Contact information: [address] [facility] [place] [address] [phone]                Discharge Exam: Fredricka Weights   08/05/24 0700  Weight: 79.9 kg   General.  Well-developed gentleman, in no acute distress. Pulmonary.  Lungs clear bilaterally, normal respiratory effort. CV.  Regular rate and rhythm, no JVD, rub or murmur. Abdomen.  Soft, nontender, nondistended, BS positive. CNS.  Alert and oriented .  No focal neurologic deficit. Extremities.  No edema, no cyanosis, pulses intact and symmetrical. Psychiatry.  Judgment and  insight appears normal.   Condition at discharge: stable  The results of significant diagnostics from this hospitalization (including imaging, microbiology, ancillary and laboratory) are listed below for reference.   Imaging Studies: EEG adult Result Date: 08/06/2024 Shelton Arlin KIDD, MD     08/06/2024  2:34 PM Patient Name: Daril DELENA Tawni Mickey. MRN: [id number] Epilepsy Attending: Arlin KIDD Shelton Referring Physician/Provider: Caleen Qualia, MD Date: 08/06/2024 Duration: 31.25 mins Patient history: 28 y.o. male with hx of alcohol abuse, presented to the ED for abdominal pain and vomiting when while waiting for evaluation, had witnessed episode of whole body shaking and stiffening that lasted for about a minute.  EEG to evaluate for seizure. Level of alertness: Awake AEDs during EEG study: None Technical aspects: This EEG study was done with scalp electrodes positioned according to the 10-20 International system of electrode placement. Electrical activity was reviewed with band pass filter of 1-70Hz , sensitivity of 7 uV/mm, display speed of 53mm/sec with a 60Hz  notched filter applied as appropriate. EEG data were recorded continuously and digitally stored.  Video monitoring was available and reviewed as appropriate. Description: EEG showed an excessive amount of 15 to 18 Hz beta activity distributed symmetrically and diffusely.  Hyperventilation and photic stimulation were not performed.   ABNORMALITY - Excessive beta, generalized IMPRESSION: This study is within normal limits. The excessive beta activity seen in the background is most likely due to the effect of benzodiazepine and is a benign EEG pattern. No seizures or epileptiform discharges were seen throughout the recording. A normal interictal EEG does not exclude the diagnosis of epilepsy. Priyanka KIDD Shelton   US  Abdomen Limited RUQ (LIVER/GB) Result Date: 08/05/2024 CLINICAL DATA:  Elevated transaminase. EXAM: ULTRASOUND ABDOMEN LIMITED RIGHT UPPER  QUADRANT COMPARISON:  CT scan earlier same day FINDINGS: Gallbladder: No gallstones or gallbladder wall thickening. No pericholecystic fluid. The sonographer reports no sonographic Murphy's sign. Common bile duct: Diameter: 3 mm Liver: Diffusely coarsened and echogenic liver parenchyma suggests fatty deposition. No focal abnormality within the liver parenchyma. Portal vein is patent on color Doppler imaging with normal direction of blood flow towards the liver. Other: None. IMPRESSION: 1. Diffusely coarsened and echogenic liver parenchyma suggests fatty deposition. 2. No evidence for cholelithiasis or biliary dilatation. Electronically Signed   By: Camellia Candle M.D.   On: 08/05/2024 12:28   CT ABDOMEN PELVIS W CONTRAST Result Date: 08/05/2024 CLINICAL DATA:  Vomiting. EXAM: CT ABDOMEN AND PELVIS WITH CONTRAST TECHNIQUE: Multidetector CT imaging of the abdomen and pelvis was performed using the standard protocol following bolus administration of intravenous contrast. RADIATION DOSE REDUCTION: This exam was performed according to the departmental dose-optimization program which includes automated exposure control, adjustment of the mA and/or kV according to patient size and/or use of iterative reconstruction technique. CONTRAST:  OMNIPAQUE  IOHEXOL  300 MG/ML  SOLN COMPARISON:  Apr 27, 2024  FINDINGS: Lower chest: Mild atelectatic changes are seen within the posterior aspect of the left lung base. Hepatobiliary: There is diffuse fatty infiltration of the liver parenchyma. No focal liver abnormality is seen. No gallstones, gallbladder wall thickening, or biliary dilatation. Pancreas: Unremarkable. No pancreatic ductal dilatation or surrounding inflammatory changes. Spleen: Normal in size without focal abnormality. Adrenals/Urinary Tract: Adrenal glands are unremarkable. Kidneys are normal, without renal calculi, focal lesion, or hydronephrosis. Marked severity anterior and lateral urinary bladder wall thickening  is seen. This represents a new finding when compared to the prior study. Stomach/Bowel: Stomach is within normal limits. Appendix appears normal. No evidence of bowel wall thickening, distention, or inflammatory changes. Vascular/Lymphatic: No significant vascular findings are present. No enlarged abdominal or pelvic lymph nodes. Reproductive: Prostate is unremarkable. Other: No abdominal wall hernia or abnormality. No abdominopelvic ascites. Musculoskeletal: No acute or significant osseous findings. IMPRESSION: 1. Marked severity anterior and lateral urinary bladder wall thickening, which may represent sequelae associated with acute cystitis. Correlation with urinalysis is recommended. 2. Hepatic steatosis. Electronically Signed   By: Suzen Dials M.D.   On: 08/05/2024 03:06   CT Head Wo Contrast Result Date: 08/05/2024 CLINICAL DATA:  Seizure, new-onset, no history of trauma EXAM: CT HEAD WITHOUT CONTRAST TECHNIQUE: Contiguous axial images were obtained from the base of the skull through the vertex without intravenous contrast. RADIATION DOSE REDUCTION: This exam was performed according to the departmental dose-optimization program which includes automated exposure control, adjustment of the mA and/or kV according to patient size and/or use of iterative reconstruction technique. COMPARISON:  CT head 11/16/2023. FINDINGS: Brain: No evidence of acute infarction, hemorrhage, hydrocephalus, extra-axial collection or mass lesion/mass effect. Vascular: No hyperdense vessel or unexpected calcification. Skull: No acute fracture. Sinuses/Orbits: Clear sinuses.  No acute orbital findings. Other: No mastoid effusions. IMPRESSION: No evidence of acute intracranial abnormality. Electronically Signed   By: Gilmore GORMAN Molt M.D.   On: 08/05/2024 02:37    Microbiology: Results for orders placed or performed during the hospital encounter of 08/05/24  Resp panel by RT-PCR (RSV, Flu A&B, Covid) Anterior Nasal Swab      Status: None   Collection Time: 08/05/24 11:33 AM   Specimen: Anterior Nasal Swab  Result Value Ref Range Status   SARS Coronavirus 2 by RT PCR NEGATIVE NEGATIVE Final    Comment: (NOTE) SARS-CoV-2 target nucleic acids are NOT DETECTED.  The SARS-CoV-2 RNA is generally detectable in upper respiratory specimens during the acute phase of infection. The lowest concentration of SARS-CoV-2 viral copies this assay can detect is 138 copies/mL. A negative result does not preclude SARS-Cov-2 infection and should not be used as the sole basis for treatment or other patient management decisions. A negative result may occur with  improper specimen collection/handling, submission of specimen other than nasopharyngeal swab, presence of viral mutation(s) within the areas targeted by this assay, and inadequate number of viral copies(<138 copies/mL). A negative result must be combined with clinical observations, patient history, and epidemiological information. The expected result is Negative.  Fact Sheet for Patients:  BloggerCourse.com  Fact Sheet for Healthcare Providers:  SeriousBroker.it  This test is no t yet approved or cleared by the [place]  FDA and  has been authorized for detection and/or diagnosis of SARS-CoV-2 by FDA under an Emergency Use Authorization (EUA). This EUA will remain  in effect (meaning this test can be used) for the duration of the COVID-19 declaration under Section 564(b)(1) of the Act, 21 U.S.C.section 360bbb-3(b)(1), unless the authorization is terminated  or revoked sooner.       Influenza A by PCR NEGATIVE NEGATIVE Final   Influenza B by PCR NEGATIVE NEGATIVE Final    Comment: (NOTE) The Xpert Xpress SARS-CoV-2/FLU/RSV plus assay is intended as an aid in the diagnosis of influenza from Nasopharyngeal swab specimens and should not be used as a sole basis for treatment. Nasal washings and aspirates are  unacceptable for Xpert Xpress SARS-CoV-2/FLU/RSV testing.  Fact Sheet for Patients: BloggerCourse.com  Fact Sheet for Healthcare Providers: SeriousBroker.it  This test is not yet approved or cleared by the [place]  FDA and has been authorized for detection and/or diagnosis of SARS-CoV-2 by FDA under an Emergency Use Authorization (EUA). This EUA will remain in effect (meaning this test can be used) for the duration of the COVID-19 declaration under Section 564(b)(1) of the Act, 21 U.S.C. section 360bbb-3(b)(1), unless the authorization is terminated or revoked.     Resp Syncytial Virus by PCR NEGATIVE NEGATIVE Final    Comment: (NOTE) Fact Sheet for Patients: BloggerCourse.com  Fact Sheet for Healthcare Providers: SeriousBroker.it  This test is not yet approved or cleared by the [place]  FDA and has been authorized for detection and/or diagnosis of SARS-CoV-2 by FDA under an Emergency Use Authorization (EUA). This EUA will remain in effect (meaning this test can be used) for the duration of the COVID-19 declaration under Section 564(b)(1) of the Act, 21 U.S.C. section 360bbb-3(b)(1), unless the authorization is terminated or revoked.  Performed at [facility], [address] [place], [place] [address]   C Difficile Quick Screen w PCR reflex     Status: None   Collection Time: 08/05/24 12:06 PM   Specimen: STOOL  Result Value Ref Range Status   C Diff antigen NEGATIVE NEGATIVE Final   C Diff toxin NEGATIVE NEGATIVE Final   C Diff interpretation No C. difficile detected.  Final    Comment: Performed at [facility], [address] [place], [place] [address]  Gastrointestinal Panel by PCR , Stool     Status: None   Collection Time: 08/05/24 12:06 PM   Specimen: STOOL  Result Value Ref Range Status   Campylobacter species NOT DETECTED NOT  DETECTED Final   Plesimonas shigelloides NOT DETECTED NOT DETECTED Final   Salmonella species NOT DETECTED NOT DETECTED Final   Yersinia enterocolitica NOT DETECTED NOT DETECTED Final   Vibrio species NOT DETECTED NOT DETECTED Final   Vibrio cholerae NOT DETECTED NOT DETECTED Final   Enteroaggregative E coli (EAEC) NOT DETECTED NOT DETECTED Final   Enteropathogenic E coli (EPEC) NOT DETECTED NOT DETECTED Final   Enterotoxigenic E coli (ETEC) NOT DETECTED NOT DETECTED Final   Shiga like toxin producing E coli (STEC) NOT DETECTED NOT DETECTED Final   Shigella/Enteroinvasive E coli (EIEC) NOT DETECTED NOT DETECTED Final   Cryptosporidium NOT DETECTED NOT DETECTED Final   Cyclospora cayetanensis NOT DETECTED NOT DETECTED Final   Entamoeba histolytica NOT DETECTED NOT DETECTED Final   Giardia lamblia NOT DETECTED NOT DETECTED Final   Adenovirus F40/41 NOT DETECTED NOT DETECTED Final   Astrovirus NOT DETECTED NOT DETECTED Final   Norovirus GI/GII NOT DETECTED NOT DETECTED Final   Rotavirus A NOT DETECTED NOT DETECTED Final   Sapovirus (I, II, IV, and V) NOT DETECTED NOT DETECTED Final    Comment: Performed at [facility], [address] [place], [place] [address]    Labs: CBC: Recent Labs  Lab 08/04/24 2315 08/05/24 0549 08/06/24 0430  WBC 3.9* 4.6  4.1  HGB 15.5 14.3 14.5  HCT 44.3 40.0 41.2  MCV 96.1 95.5 96.5  PLT 95* 83* 70*   Basic Metabolic Panel: Recent Labs  Lab 08/04/24 2315 08/05/24 0549 08/06/24 0430  NA 136 137 133*  K 3.6 3.2* 3.4*  CL 91* 98 97*  CO2 28 28 27   GLUCOSE 129* 91 84  BUN 10 11 6   CREATININE 0.83 0.66 0.68  CALCIUM 10.3 9.4 9.3  MG  --  2.0  --    Liver Function Tests: Recent Labs  Lab 08/04/24 2315 08/06/24 0430  AST 205* 131*  ALT 127* 85*  ALKPHOS 56 44  BILITOT 3.4* 2.5*  PROT 9.1* 7.5  ALBUMIN 5.0 3.9   CBG: No results for input(s): GLUCAP in the last 168 hours.  Discharge time spent: greater than 30  minutes.  This record has been created using Conservation officer, historic buildings. Errors have been sought and corrected,but may not always be located. Such creation errors do not reflect on the standard of care.   Signed: Amaryllis Dare, MD [facility] 08/06/2024

## 2024-08-06 NOTE — Procedures (Signed)
 Patient Name: Lawrence Griffith.  MRN: [id number]  Epilepsy Attending: Arlin MALVA Krebs  Referring Physician/Provider: Caleen Qualia, MD  Date: 08/06/2024 Duration: 31.25 mins  Patient history: 28 y.o. male with hx of alcohol abuse, presented to the ED for abdominal pain and vomiting when while waiting for evaluation, had witnessed episode of whole body shaking and stiffening that lasted for about a minute.  EEG to evaluate for seizure.  Level of alertness: Awake  AEDs during EEG study: None  Technical aspects: This EEG study was done with scalp electrodes positioned according to the 10-20 International system of electrode placement. Electrical activity was reviewed with band pass filter of 1-70Hz , sensitivity of 7 uV/mm, display speed of 36mm/sec with a 60Hz  notched filter applied as appropriate. EEG data were recorded continuously and digitally stored.  Video monitoring was available and reviewed as appropriate.  Description: EEG showed an excessive amount of 15 to 18 Hz beta activity distributed symmetrically and diffusely.  Hyperventilation and photic stimulation were not performed.     ABNORMALITY - Excessive beta, generalized  IMPRESSION: This study is within normal limits. The excessive beta activity seen in the background is most likely due to the effect of benzodiazepine and is a benign EEG pattern. No seizures or epileptiform discharges were seen throughout the recording.  A normal interictal EEG does not exclude the diagnosis of epilepsy.   Breton Berns O Elfriede Bonini

## 2024-08-06 NOTE — Progress Notes (Signed)
 Discharge instructions given to patient, questions answered.  IV removed without complications. Patient transporting home via car by his parents.

## 2024-08-06 NOTE — Progress Notes (Signed)
 Met with patient at bedside. Introduced patient to role of Statistician. Family known to nurse navigator from previous admissions.  Patient is homeless. He is living in a car with his parents, brother (30 yrs old) and brother's girlfriend, along with 2 dogs (lab/pit mix and a chihuahua). This is an increase of two people and a dog from last contact with family.  Patient states everyone in the car gets a check. He further elaborates we could work, we just choose not to. Patient states he thinks he gets about $700 a month. His mother receives food stamps. He stated he used to but lost my card.   Patient does NOT currently have a PCP, but would like to establish care. Prefers a Corporate investment banker. Will set up appointment.   Patient DOES have pending criminal charges. Requests nurse navigator look up court date, as he does not remember. Court date slated for 10/9. Given information for [employer].   Patient does NOT have a working cell phone. Number in chart is for his mother, Tammy.   Patient states he drinks about 15 beers a day, and has been drinking since he was 29 yrs old. Patient minimizes his drinking habits, laughing while answering questions regarding substance abuse and alcohol use. UDS positive for benzos and THC. UDS in December positive for Cocaine and THC. Denies need for substance abuse treatment.   Encouraged to call with questions or concerns.

## 2024-08-06 NOTE — Discharge Instructions (Signed)
  [facility]    [facility] [address]     [address] [place],  [place]     [place], [place] [phone]      [phone]  [facility]   [facility]  (Inpatient and outpatient)  [phone] (Suboxone and Methadone) [address]           [phone]           [facility]: [facility]    [facility] - Intensive Outpatient [address]     [address] [place], [place] [address]     [place], [place]  [phone]      [phone]  [facility] (Outpatient, Inpatient, [facility] (Groups and Residental) (insurance only) [phone]    [place], [place]          [phone]       [facility]    [facility] (for caregivers and family) [address]     [address] [place], [place]     [place], [place] [phone]      [phone]  Residential Treatment Programs  [facility]  [facility](2 years) Residential: 90 days)  [facility] ([facility] [address] [facility]      [address] [place], [place]     [place], [place] [phone]      [phone] or [phone]  [facility]    [facility] [address]      [address] [place], [place]     [place], [place] [phone]      [phone]  [facility]   [facility] ([facility]) [address]     [address] [place], [place] [address]     [place], [place] [phone]      [phone] Admissions: 8am-3pm M-F  [facility]: Residential Program [phone] Days)              [facility]: [facility]  [place], [place]     [place], [place]  [phone] or [phone]    (Walk in Hours over the weekend or by referral)   Crisis Mobile: [facility]:[phone] (for crisis response 24 hours a day)

## 2024-08-06 NOTE — Plan of Care (Signed)

## 2024-08-08 LAB — HCV RNA QUANT RFLX ULTRA OR GENOTYP
HCV RNA Qnt(log copy/mL): 6.037 {Log_IU}/mL
HepC Qn: 1090000 [IU]/mL

## 2024-08-08 LAB — HEPATITIS C GENOTYPE: Hepatitis C Genotype: 3

## 2024-08-11 ENCOUNTER — Emergency Department: Payer: MEDICAID

## 2024-08-11 ENCOUNTER — Emergency Department
Admission: EM | Admit: 2024-08-11 | Discharge: 2024-08-11 | Disposition: A | Discharge status: Home / Self Care | Payer: MEDICAID | Attending: Emergency Medicine | Admitting: Emergency Medicine

## 2024-08-11 ENCOUNTER — Other Ambulatory Visit: Payer: Self-pay

## 2024-08-11 DIAGNOSIS — Y908 Blood alcohol level of 240 mg/100 ml or more: Secondary | ICD-10-CM | POA: Insufficient documentation

## 2024-08-11 DIAGNOSIS — W07XXXA Fall from chair, initial encounter: Secondary | ICD-10-CM | POA: Insufficient documentation

## 2024-08-11 DIAGNOSIS — W19XXXA Unspecified fall, initial encounter: Secondary | ICD-10-CM

## 2024-08-11 DIAGNOSIS — F10120 Alcohol abuse with intoxication, uncomplicated: Secondary | ICD-10-CM | POA: Diagnosis not present

## 2024-08-11 DIAGNOSIS — R4182 Altered mental status, unspecified: Secondary | ICD-10-CM | POA: Insufficient documentation

## 2024-08-11 DIAGNOSIS — F10920 Alcohol use, unspecified with intoxication, uncomplicated: Secondary | ICD-10-CM

## 2024-08-11 LAB — COMPREHENSIVE METABOLIC PANEL WITH GFR
ALT: 333 U/L — ABNORMAL HIGH (ref 0–44)
AST: 433 U/L — ABNORMAL HIGH (ref 15–41)
Albumin: 3.6 g/dL (ref 3.5–5.0)
Alkaline Phosphatase: 52 U/L (ref 38–126)
Anion gap: 6 (ref 5–15)
BUN: <5 mg/dL — ABNORMAL LOW (ref 6–20)
CO2: 22 mmol/L (ref 22–32)
Calcium: 7.7 mg/dL — ABNORMAL LOW (ref 8.9–10.3)
Chloride: 115 mmol/L — ABNORMAL HIGH (ref 98–111)
Creatinine, Ser: 0.49 mg/dL — ABNORMAL LOW (ref 0.61–1.24)
GFR, Estimated: >60 mL/min (ref >60)
Glucose, Bld: 108 mg/dL — ABNORMAL HIGH (ref 70–99)
Potassium: 4.1 mmol/L (ref 3.5–5.1)
Sodium: 143 mmol/L (ref 135–145)
Total Bilirubin: 1.1 mg/dL (ref 0.0–1.2)
Total Protein: 7.5 g/dL (ref 6.5–8.1)

## 2024-08-11 LAB — CBC WITH DIFFERENTIAL/PLATELET
Abs Immature Granulocytes: 0.01 K/uL (ref 0.00–0.07)
Basophils Absolute: 0.1 K/uL (ref 0.0–0.1)
Basophils Relative: 2 %
Eosinophils Absolute: 0.1 K/uL (ref 0.0–0.5)
Eosinophils Relative: 2 %
HCT: 43.4 % (ref 39.0–52.0)
Hemoglobin: 14.7 g/dL (ref 13.0–17.0)
Immature Granulocytes: 0 %
Lymphocytes Relative: 45 %
Lymphs Abs: 2 K/uL (ref 0.7–4.0)
MCH: 33.6 pg (ref 26.0–34.0)
MCHC: 33.9 g/dL (ref 30.0–36.0)
MCV: 99.3 fL (ref 80.0–100.0)
Monocytes Absolute: 0.7 K/uL (ref 0.1–1.0)
Monocytes Relative: 16 %
Neutro Abs: 1.6 K/uL — ABNORMAL LOW (ref 1.7–7.7)
Neutrophils Relative %: 35 %
Platelets: 215 K/uL (ref 150–400)
RBC: 4.37 MIL/uL (ref 4.22–5.81)
RDW: 12.2 % (ref 11.5–15.5)
WBC: 4.4 K/uL (ref 4.0–10.5)
nRBC: 0 % (ref 0.0–0.2)

## 2024-08-11 LAB — URINE DRUG SCREEN, QUALITATIVE (ARMC ONLY)
Amphetamines, Ur Screen: NOT DETECTED
Barbiturates, Ur Screen: NOT DETECTED
Benzodiazepine, Ur Scrn: POSITIVE — AB
Cannabinoid 50 Ng, Ur ~~LOC~~: NOT DETECTED
Cocaine Metabolite,Ur ~~LOC~~: NOT DETECTED
MDMA (Ecstasy)Ur Screen: NOT DETECTED
Methadone Scn, Ur: NOT DETECTED
Opiate, Ur Screen: NOT DETECTED
Phencyclidine (PCP) Ur S: NOT DETECTED
Tricyclic, Ur Screen: NOT DETECTED

## 2024-08-11 LAB — ETHANOL: Alcohol, Ethyl (B): >450 mg/dL (ref <15)

## 2024-08-11 MED ORDER — LACTATED RINGERS IV BOLUS
1000.0000 mL | Freq: Once | INTRAVENOUS | Status: AC
  Administered 2024-08-11: 1000 mL via INTRAVENOUS

## 2024-08-11 NOTE — ED Provider Notes (Signed)
-----------------------------------------   3:05 PM on 08/11/2024 -----------------------------------------  Blood pressure 114/77, pulse 90, temperature 98.2 F (36.8 C), temperature source Oral, resp. rate 16, height 5' 9 (1.753 m), weight 94.9 kg, SpO2 100%.  Assuming care from Dr. Waymond.  In short, Lawrence Griffith. is a 28 y.o. male with a chief complaint of Altered Mental Status and Loss of Consciousness .  Refer to the original H&P for additional details.  The current plan of care is to reassess, plan for discharge once clinically sober.  ----------------------------------------- 8:58 PM on 08/11/2024 ----------------------------------------- Patient much more awake and alert on reassessment, answering questions appropriately and ambulatory with a steady gait.  He is appropriate for discharge home with outpatient follow-up, was counseled to return to the ED for new or worsening symptoms.  Patient agrees with plan.   Willo Dunnings, MD 08/11/24 [phone]

## 2024-08-11 NOTE — ED Notes (Signed)
 RN notified provider of neuro change.

## 2024-08-11 NOTE — ED Provider Notes (Signed)
 [facility] Provider Note    Event Date/Time   First MD Initiated Contact with Patient 08/11/24 1220     (approximate)   History   Altered Mental Status and Loss of Consciousness   HPI  Lawrence Griffith. is a 28 y.o. male with history of alcohol intoxication presenting with change in responsiveness.  Patient was brought in by EMS from [facility], he was noted to be sitting in a chair, fell of his chair.  No seizure-like activity.  Family reported that patient was drinking heavily.  No other drug paraphernalia found around him.     Independent history obtained from EMS as above.  He was satting in the high 90s for them on room air, was tachypneic, no pinpoint pupils.  No meds were given.  Fingerstick was 115.  On independent review, he was discharged on 25 August for alcohol withdrawal seizure.  History of polysubstance abuse, had presented at the time with abdominal pain and nausea vomiting, had a brief seizure and was somnolent but arousable after, drinks alcohol on a daily basis and at the time last drink was 3 days ago.   Physical Exam   Triage Vital Signs: ED Triage Vitals  Encounter Vitals Group     BP      Girls Systolic BP Percentile      Girls Diastolic BP Percentile      Boys Systolic BP Percentile      Boys Diastolic BP Percentile      Pulse      Resp      Temp      Temp src      SpO2      Weight      Height      Head Circumference      Peak Flow      Pain Score      Pain Loc      Pain Education      Exclude from Growth Chart     Most recent vital signs: Vitals:   08/11/24 1409 08/11/24 1410  BP:    Pulse: 83 90  Resp:    Temp:    SpO2: 100% 100%     General: Eyes open, sits up and groans CV:  Good peripheral perfusion.  Resp:  Normal effort.  No thoracic cage tenderness, no tachypnea Abd:  No distention.  Soft nontender Other:  No palpable skull deformities or tenderness, no tenderness to his extremities, he  withdraws to painful stimuli, pupils are equal and reactive   ED Results / Procedures / Treatments   Labs (all labs ordered are listed, but only abnormal results are displayed) Labs Reviewed  CBC WITH DIFFERENTIAL/PLATELET - Abnormal; Notable for the following components:      Result Value   Neutro Abs 1.6 (*)    All other components within normal limits  ETHANOL - Abnormal; Notable for the following components:   Alcohol, Ethyl (B) >450 (*)    All other components within normal limits  URINE DRUG SCREEN, QUALITATIVE ([facility] ONLY) - Abnormal; Notable for the following components:   Benzodiazepine, Ur Scrn POSITIVE (*)    All other components within normal limits  COMPREHENSIVE METABOLIC PANEL WITH GFR - Abnormal; Notable for the following components:   Chloride 115 (*)    Glucose, Bld 108 (*)    BUN <5 (*)    Creatinine, Ser 0.49 (*)    Calcium 7.7 (*)    AST 433 (*)  ALT 333 (*)    All other components within normal limits     EKG  EKG shows, sinus rhythm, rate 98, normal QS, normal QTc, no obvious ischemic ST elevation, T wave flattening in 3, not significantly compared to prior   RADIOLOGY On my independent interpretation, CT head without obvious intracranial hemorrhage   PROCEDURES:  Critical Care performed: No  Procedures   MEDICATIONS ORDERED IN ED: Medications  lactated ringers  bolus 1,000 mL (0 mLs Intravenous Stopped 08/11/24 1430)     IMPRESSION / MDM / ASSESSMENT AND PLAN / ED COURSE  I reviewed the triage vital signs and the nursing notes.                              Differential diagnosis includes, but is not limited to, alcohol intoxication, drug use, electrolyte derangements, intracranial hemorrhage.  He had no witnessed seizure-like activity to suggest alcohol withdrawal seizure or seizures.  Will get labs, alcohol levels, CT head.   Patient's presentation is most consistent with acute presentation with potential threat to life or bodily  function.  Independent interpretation of labs and imaging below.  Will plan to monitor him in the emergency department for sobriety.  Patient signed out pending observation until he is sober.  LFTs were noted to be elevated, ordered ultrasound.  The patient is on the cardiac monitor to evaluate for evidence of arrhythmia and/or significant heart rate changes.   Clinical Course as of 08/11/24 1518  Sat Aug 11, 2024  1309 DG Chest 1 View IMPRESSION: 1. No acute findings. 2. Right distal clavicular fracture demonstrates some healing suggesting late subacute or chronic fracture .   [TT]  1348 CT Head Wo Contrast 1. No acute intracranial abnormality.  [TT]  1349 Independent review of labs, ethanol levels are greater than 450, no leukocytosis. [TT]  1427 Urine Drug Screen, Qualitative(!) Benzo positive [TT]    Clinical Course User Index [TT] Waymond Lorelle Cummins, MD     FINAL CLINICAL IMPRESSION(S) / ED DIAGNOSES   Final diagnoses:  Altered mental status, unspecified altered mental status type  Alcoholic intoxication without complication (HCC)  Fall, initial encounter     Rx / DC Orders   ED Discharge Orders     None        Note:  This document was prepared using Dragon voice recognition software and may include unintentional dictation errors.    Waymond Lorelle Cummins, MD 08/11/24 [phone]

## 2024-08-11 NOTE — ED Notes (Signed)
 Mother in room. Mother communicated to primary RN that patient had been drinking this morning and that the pt DID hit his head. Mother updated on POC at this time.

## 2024-08-11 NOTE — ED Notes (Signed)
 Pt placed on 2l/min via [place] for low 02 saturations.

## 2024-08-11 NOTE — ED Triage Notes (Signed)
 family reports alchol consumption. Possible hx of drug use  found at [facility] unresponsive. patietns eyes open, responsive to pain. patient does moan and groin at this time. pt pupils are round at 4mm currently. 18G RAC. EDP at bedside waking patient at this time. Patient maintianing airway at this time and able to cough. No medications given by EMS enroute.  130/90, 105ST, 98%, 115CBG.

## 2024-08-11 NOTE — ED Notes (Signed)
Urinal at bedside. Pt unable to urinate at this time.

## 2024-08-11 NOTE — ED Notes (Signed)
 Transported to CT

## 2024-08-14 ENCOUNTER — Encounter: Payer: Self-pay | Admitting: Infectious Diseases

## 2024-08-14 ENCOUNTER — Ambulatory Visit: Payer: MEDICAID | Attending: Infectious Diseases | Admitting: Infectious Diseases

## 2024-08-14 ENCOUNTER — Other Ambulatory Visit
Admission: RE | Admit: 2024-08-14 | Discharge: 2024-08-14 | Disposition: A | Discharge status: Home / Self Care | Payer: MEDICAID | Source: Ambulatory Visit | Attending: Infectious Diseases | Admitting: Infectious Diseases

## 2024-08-14 VITALS — BP 135/88 | HR 85 | Temp 98.8°F | Ht 66.0 in | Wt 182.0 lb

## 2024-08-14 DIAGNOSIS — F1020 Alcohol dependence, uncomplicated: Secondary | ICD-10-CM | POA: Insufficient documentation

## 2024-08-14 DIAGNOSIS — B182 Chronic viral hepatitis C: Secondary | ICD-10-CM | POA: Diagnosis present

## 2024-08-14 DIAGNOSIS — Z5902 Unsheltered homelessness: Secondary | ICD-10-CM | POA: Insufficient documentation

## 2024-08-14 DIAGNOSIS — F1090 Alcohol use, unspecified, uncomplicated: Secondary | ICD-10-CM

## 2024-08-14 DIAGNOSIS — B1920 Unspecified viral hepatitis C without hepatic coma: Secondary | ICD-10-CM | POA: Diagnosis present

## 2024-08-14 DIAGNOSIS — K700 Alcoholic fatty liver: Secondary | ICD-10-CM | POA: Insufficient documentation

## 2024-08-14 LAB — HIV ANTIBODY (ROUTINE TESTING W REFLEX): HIV Screen 4th Generation wRfx: NONREACTIVE

## 2024-08-14 LAB — HEPATITIS A ANTIBODY, TOTAL: hep A Total Ab: REACTIVE — AB

## 2024-08-14 LAB — PROTIME-INR
INR: 1.1 (ref 0.8–1.2)
Prothrombin Time: 14.8 s (ref 11.4–15.2)

## 2024-08-14 LAB — APTT: aPTT: 28 s (ref 24–36)

## 2024-08-14 NOTE — Progress Notes (Signed)
 NAME: Lawrence Griffith.  DOB: 04-02-1996  MRN: [id number]  Date/Time: 08/14/2024 10:49 AM   Subjective:   ?pt consented to the use of AI scribe Shelly A Houdeshell Jr. is a 28 year old male with hepatitis C who presents for evaluation and management of his condition.  He was recently diagnosed with hepatitis C during a hospital admission for alcohol withdrawal. The viral load is one million, and the genotype is type 3. He has not received any treatment for hepatitis C yet and was unaware of his status prior to this hospitalization.  He has a history of alcohol use, consuming up to thirty 12-ounce cans of beer daily, along with vodka. He has recently reduced his intake to twelve cans per day. He started drinking at the age of 47 and has been trying to cut back. He is currently homeless, often sleeping in his father's car, and performs odd jobs for income.  No current  intravenous drug use, but he admits to past cocaine use . He reports having injected drugs a few times approximately five years ago. He has not been previously informed about his hepatitis C status until his recent hospital visit.  CT Imaging has shown  fatty liver. He was found unresponsive at a [place] location after drinking heavily, which led to his recent emergency room visit. He has no current medications and is not under the care of a primary physician.  He lives with his parents intermittently but primarily stays in his father's car. He has a seven-year-old emotional support dog named Midnight, who stays with him. He has no children and is not married. He has a history of multiple relationships that ended poorly, contributing to his alcohol use.  He has received vaccinations for hepatitis A and B in the past, as confirmed by his medical records. He has no known history of hepatitis B infection.  Past Medical History:  Diagnosis Date   Asthma     Past Surgical History:  Procedure Laterality Date   ORIF TIBIA PLATEAU  Left 05/13/2020   Procedure: OPEN REDUCTION INTERNAL FIXATION (ORIF) OF LEFT LATERAL TIBIAL PLATEAU FRACTURE;  Surgeon: Tobie Priest, MD;  Location: [facility] ORS;  Service: Orthopedics;  Laterality: Left;    Social History   Socioeconomic History   Marital status: Single    Spouse name: Not on file   Number of children: Not on file   Years of education: Not on file   Highest education level: Not on file  Occupational History   Not on file  Tobacco Use   Smoking status: Never   Smokeless tobacco: Current    Types: Chew  Vaping Use   Vaping status: Never Used  Substance and Sexual Activity   Alcohol use: Yes    Alcohol/week: 6.0 standard drinks of alcohol    Types: 6 Cans of beer per week    Comment: 15 cans beer a day previously   Drug use: Not Currently    Frequency: 7.0 times per week    Types: Marijuana   Sexual activity: Not on file  Other Topics Concern   Not on file  Social History Narrative   Not on file   Social Drivers of Health   Financial Resource Strain: Not on file  Food Insecurity: No Food Insecurity (08/05/2024)   Hunger Vital Sign    Worried About Running Out of Food in the Last Year: Never true    Ran Out of Food in the Last Year: Never true  Transportation Needs: No Transportation Needs (08/05/2024)   PRAPARE - Administrator, Civil Service (Medical): No    Lack of Transportation (Non-Medical): No  Physical Activity: Not on file  Stress: Not on file  Social Connections: Not on file  Intimate Partner Violence: Not At Risk (08/05/2024)   Humiliation, Afraid, Rape, and Kick questionnaire    Fear of Current or Ex-Partner: No    Emotionally Abused: No    Physically Abused: No    Sexually Abused: No    No family history on file. Allergies  Allergen Reactions   Clarithromycin     Other reaction(s): HIVES   Penicillamine Hives   Penicillins Hives    Has patient had a PCN reaction causing immediate rash, facial/tongue/throat swelling, SOB or  lightheadedness with hypotension: Yes Has patient had a PCN reaction causing severe rash involving mucus membranes or skin necrosis: No Has patient had a PCN reaction that required hospitalization No Has patient had a PCN reaction occurring within the last 10 years: Yes If all of the above answers are NO, then may proceed with Cephalosporin use.   Sulfa Antibiotics     Other reaction(s): HIVES   I? No current outpatient medications on file.   No current facility-administered medications for this visit.     Abtx:  Anti-infectives (From admission, onward)    None       REVIEW OF SYSTEMS:  Const: negative fever, negative chills, negative weight loss Eyes: negative diplopia or visual changes, negative eye pain ENT: negative coryza, negative sore throat Resp: negative cough, hemoptysis, dyspnea Cards: negative for chest pain, palpitations, lower extremity edema GU: negative for frequency, dysuria and hematuria GI: Negative for abdominal pain, diarrhea, bleeding, constipation Skin: negative for rash and pruritus Heme: negative for easy bruising and gum/nose bleeding MS: negative for myalgias, arthralgias, back pain and muscle weakness Neurolo:negative for headaches, dizziness, vertigo, memory problems  Psych:  anxiety, depression  Endocrine: negative for thyroid, diabetes Allergy/Immunology-penicillin, sulfa and clarithromycin Objective:  VITALS:  BP 135/88   Pulse 85   Temp 98.8 F (37.1 C) (Temporal)   Ht 5' 6 (1.676 m)   Wt 182 lb (82.6 kg)   SpO2 97%   BMI 29.38 kg/m   PHYSICAL EXAM:  General: Alert, cooperative, no distress, appears stated age.  Head: Normocephalic, without obvious abnormality, atraumatic. Eyes: Conjunctivae clear, anicteric sclerae. Pupils are equal ENT Nares normal. No drainage or sinus tenderness. Lips, mucosa, and tongue normal. No Thrush Neck: Supple, symmetrical, no adenopathy, thyroid: non tender no carotid bruit and no JVD. Back: No  CVA tenderness. Lungs: Clear to auscultation bilaterally. No Wheezing or Rhonchi. No rales. Heart: Regular rate and rhythm, no murmur, rub or gallop. Abdomen: Soft, non-tender,not distended. Bowel sounds normal. No masses Extremities: atraumatic, no cyanosis. No edema. No clubbing Skin: No rashes or lesions. Or bruising Lymph: Cervical, supraclavicular normal. Neurologic: Grossly non-focal Pertinent Labs Lab Results CBC    Component Value Date/Time   WBC 4.4 08/11/2024 1223   RBC 4.37 08/11/2024 1223   HGB 14.7 08/11/2024 1223   HCT 43.4 08/11/2024 1223   PLT 215 08/11/2024 1223   MCV 99.3 08/11/2024 1223   MCH 33.6 08/11/2024 1223   MCHC 33.9 08/11/2024 1223   RDW 12.2 08/11/2024 1223   LYMPHSABS 2.0 08/11/2024 1223   MONOABS 0.7 08/11/2024 1223   EOSABS 0.1 08/11/2024 1223   BASOSABS 0.1 08/11/2024 1223       Latest Ref Rng & Units 08/11/2024  12:50 PM 08/06/2024    4:30 AM 08/05/2024    5:49 AM  CMP  Glucose 70 - 99 mg/dL 891  84  91   BUN 6 - 20 mg/dL 5  6  11    Creatinine 0.61 - 1.24 mg/dL 9.50  9.31  9.33   Sodium 135 - 145 mmol/L 143  133  137   Potassium 3.5 - 5.1 mmol/L 4.1  3.4  3.2   Chloride 98 - 111 mmol/L 115  97  98   CO2 22 - 32 mmol/L 22  27  28    Calcium 8.9 - 10.3 mg/dL 7.7  9.3  9.4   Total Protein 6.5 - 8.1 g/dL 7.5  7.5    Total Bilirubin 0.0 - 1.2 mg/dL 1.1  2.5    Alkaline Phos 38 - 126 U/L 52  44    AST 15 - 41 U/L 433  131    ALT 0 - 44 U/L 333  85       Tests result DATE comment  HEPC RNA 1.09 million 08/06/24   HEPC  Genotype 3    Hepatitis B profile Sag NR  vaccinated  Hepatitis A status     PT/PTT     AST, ALT, Bilirubin     Albumin     creatinine     HB/Platelet     HIV     AFP     TSH     comorbidities      tobacco     alcohol     drug     APRI Score     FIB 4 score     Current meds     Ultrasound Elastography     Preg Test     Vaccination HEPA/HEPB       IMAGING RESULTS: US  liver on 08/11/24- hepatic  steatosis ? Impression/Recommendation Chronic hepatitis C infection   He has a chronic hepatitis C infection with a viral load of one million and genotype 3, recently diagnosed during a hospital admission for alcohol withdrawal. There was no prior knowledge of the infection. The condition is treatable with a high success rate if medication is taken consistently. Untreated hepatitis C poses risks of liver damage, cirrhosis, and liver cancer. Alcohol consumption may interfere with treatment efficacy and liver health. He is keen to start treatment and reduce alcohol intake to improve liver health. Will do some labs today including fibrosure, PT, PTT and discuss with [facility] pharmacist regarding  a 12-week course of epclusa, one pill daily. Consult with pharmacy regarding potential interactions between hepatitis C medication and alcohol. Ensure enrollment with a primary care provider for ongoing management.  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 Imaging confirms alcoholic fatty liver disease with diffuse fatty infiltration of the liver parenchyma. HE has transaminitis He has reduced alcohol intake to 12 cans daily and is willing to further reduce to 3-6 cans daily. Alcohol use is a significant risk factor for liver damage and may complicate hepatitis C treatment. He is interested in exploring medications to aid in reducing alcohol consumption. Discuss potential medications with pharmacy. Refer to primary care for management of alcohol use disorder. Encourage reduction of alcohol intake to 3-6 cans daily. Ideally abstinence which he says is not possible Provide education on the impact of alcohol on liver health and the importance of reducing intake.? ? ____  Vaccinated for HEP A and HEP B __________________________________ Discussed with patient,in detail Follow after labs Will discuss with [facility]  team

## 2024-08-14 NOTE — Patient Instructions (Signed)
 VISIT SUMMARY:  You came in today to discuss your recent diagnosis of hepatitis C and your ongoing alcohol use. We reviewed your current health status, including your liver condition, and discussed a plan to manage both your hepatitis C and alcohol use disorder.  YOUR PLAN:  -CHRONIC HEPATITIS C INFECTION: Hepatitis C is a liver infection caused by the hepatitis C virus. It can lead to liver damage if not treated. We will start you on a 12-week course of oral medication, one pill daily, to treat the infection. Additional blood tests will be done to evaluate your liver condition. It is important to reduce your alcohol intake to improve the effectiveness of the treatment and your overall liver health.  -ALCOHOL USE DISORDER WITH ALCOHOLIC FATTY LIVER DISEASE: Alcohol use disorder is a condition where you have difficulty controlling your alcohol consumption. This has led to fatty liver disease, where your liver has become enlarged and filled with fat due to excessive alcohol intake. You have already reduced your alcohol consumption and are willing to reduce it further. We will discuss potential medications to help you reduce your alcohol intake and refer you to a primary care provider for ongoing management. Reducing your alcohol intake to lowest possible or even stop it is crucial for your liver health and the success of your hepatitis C treatment. We will provide education on the impact of alcohol on your liver and the importance of reducing your intake.  INSTRUCTIONS:  today will do labs- will discuss with the pharmacist , refer you to a primary care doctor

## 2024-08-15 LAB — HCV FIBROSURE
ALPHA 2-MACROGLOBULINS, QN: 211 mg/dL (ref 110–276)
ALT (SGPT) P5P: 331 IU/L — ABNORMAL HIGH (ref 0–55)
Apolipoprotein A-1: 171 mg/dL (ref 101–178)
Bilirubin, Total: 0.6 mg/dL (ref 0.0–1.2)
Fibrosis Score: 0.4 — ABNORMAL HIGH (ref 0.00–0.21)
GGT: 887 IU/L — ABNORMAL HIGH (ref 0–65)
Haptoglobin: 136 mg/dL (ref 17–317)
Necroinflammat Activity Score: 0.92 — ABNORMAL HIGH (ref 0.00–0.17)

## 2024-08-15 LAB — HEPATITIS B SURFACE ANTIBODY, QUANTITATIVE: Hep B S AB Quant (Post): <3.5 m[IU]/mL — ABNORMAL LOW

## 2024-08-17 ENCOUNTER — Other Ambulatory Visit (HOSPITAL_COMMUNITY): Payer: Self-pay

## 2024-08-22 ENCOUNTER — Ambulatory Visit: Payer: Self-pay | Admitting: Pharmacist

## 2024-08-22 ENCOUNTER — Other Ambulatory Visit (HOSPITAL_COMMUNITY): Payer: Self-pay

## 2024-08-22 ENCOUNTER — Other Ambulatory Visit: Payer: Self-pay

## 2024-08-22 DIAGNOSIS — B182 Chronic viral hepatitis C: Secondary | ICD-10-CM

## 2024-08-22 MED ORDER — SOFOSBUVIR-VELPATASVIR 400-100 MG PO TABS
1.0000 | ORAL_TABLET | Freq: Every day | ORAL | 2 refills | Status: AC
  Filled 2024-08-22 – 2024-08-23 (×3): qty 28, 28d supply, fill #0
  Filled 2024-09-13: qty 28, 28d supply, fill #1
  Filled 2024-10-10: qty 28, 28d supply, fill #2

## 2024-08-22 NOTE — Progress Notes (Signed)
 Here is the message chain!

## 2024-08-22 NOTE — Progress Notes (Signed)
 Good afternoon!   My name is Nash Bolls, and I am one of the PGY1 residents working at the [facility] clinic this month.   In regards to the treatment of this patient's Hepatitis C infection, it is recommended to treat patients despite alcohol use via AASLD/IDSA. Epclusa  does have an interaction with his chronic alcohol use, specifically hepatotoxicity. Close monitoring is recommended.   I will send in a prescription for Eplcusa to his pharmacy. We will call the patient to counsel on the prescription prior to pick up.  Diminique can schedule a follow-up with you.   Woodie Jock, PharmD PGY1 Pharmacy Resident  08/22/2024

## 2024-08-22 NOTE — Progress Notes (Signed)
 Specialty Pharmacy Initial Fill Coordination Note  Lawrence Griffith. is a 28 y.o. male contacted today regarding initial fill of specialty medication(s) Sofosbuvir -Velpatasvir    Patient requested Courier to Provider Office   Delivery date: 08/24/24   Verified address: [facility] [address] [name] [address]   Medication will be filled on 08/23/24.   Patient is aware of $4 copayment.

## 2024-08-23 ENCOUNTER — Other Ambulatory Visit (HOSPITAL_COMMUNITY): Payer: Self-pay

## 2024-08-23 ENCOUNTER — Other Ambulatory Visit: Payer: Self-pay

## 2024-08-23 ENCOUNTER — Encounter: Payer: Self-pay | Admitting: Family Medicine

## 2024-08-23 ENCOUNTER — Ambulatory Visit: Payer: MEDICAID | Admitting: Family Medicine

## 2024-08-23 NOTE — Progress Notes (Deleted)
 New patient visit   Patient: Lawrence Griffith.   DOB: 1996-05-15   28 y.o. Male  MRN: [id number] Visit Date: 08/23/2024  Today's healthcare provider: Rockie Agent, MD   No chief complaint on file.  Subjective    Lawrence A Remmington Urieta. is a 28 y.o. male who presents today as a new patient to establish care.      Discussed the use of AI scribe software for clinical note transcription with the patient, who gave verbal consent to proceed.  History of Present Illness       Past Medical History:  Diagnosis Date   Asthma    Closed fracture of scaphoid bone of wrist 07/16/2021   Sprain of acromioclavicular ligament 07/28/2023    Outpatient Medications Prior to Visit  Medication Sig   Sofosbuvir -Velpatasvir  (EPCLUSA ) 400-100 MG TABS Take 1 tablet by mouth daily.   No facility-administered medications prior to visit.    Past Surgical History:  Procedure Laterality Date   ORIF TIBIA PLATEAU Left 05/13/2020   Procedure: OPEN REDUCTION INTERNAL FIXATION (ORIF) OF LEFT LATERAL TIBIAL PLATEAU FRACTURE;  Surgeon: Tobie Priest, MD;  Location: [facility] ORS;  Service: Orthopedics;  Laterality: Left;   No family status information on file.   No family history on file. Social History   Socioeconomic History   Marital status: Single    Spouse name: Not on file   Number of children: Not on file   Years of education: Not on file   Highest education level: Not on file  Occupational History   Not on file  Tobacco Use   Smoking status: Never   Smokeless tobacco: Current    Types: Chew  Vaping Use   Vaping status: Never Used  Substance and Sexual Activity   Alcohol use: Yes    Alcohol/week: 6.0 standard drinks of alcohol    Types: 6 Cans of beer per week    Comment: 15 cans beer a day previously   Drug use: Not Currently    Frequency: 7.0 times per week    Types: Marijuana   Sexual activity: Not on file  Other Topics Concern   Not on file  Social History  Narrative   Not on file   Social Drivers of Health   Financial Resource Strain: Not on file  Food Insecurity: No Food Insecurity (08/05/2024)   Hunger Vital Sign    Worried About Running Out of Food in the Last Year: Never true    Ran Out of Food in the Last Year: Never true  Transportation Needs: No Transportation Needs (08/05/2024)   PRAPARE - Administrator, Civil Service (Medical): No    Lack of Transportation (Non-Medical): No  Physical Activity: Not on file  Stress: Not on file  Social Connections: Not on file     Allergies  Allergen Reactions   Clarithromycin     Other reaction(s): HIVES   Penicillamine Hives   Penicillins Hives    Has patient had a PCN reaction causing immediate rash, facial/tongue/throat swelling, SOB or lightheadedness with hypotension: Yes Has patient had a PCN reaction causing severe rash involving mucus membranes or skin necrosis: No Has patient had a PCN reaction that required hospitalization No Has patient had a PCN reaction occurring within the last 10 years: Yes If all of the above answers are NO, then may proceed with Cephalosporin use.   Sulfa Antibiotics     Other reaction(s): HIVES    Immunization History  Administered Date(s)  Administered   DTP 10/08/1994, 11/26/1994, 01/20/1995, 09/22/1995   DTaP 09/26/1998   H1N1 12/31/2008   HIB, Unspecified 10/08/1994, 11/26/1994, 01/20/1995, 09/22/1995   HPV Quadrivalent 07/23/2009, 07/23/2010, 10/08/2013   Hepatitis A, Adult 08/24/2005, 07/06/2006   Hepatitis B, ADULT 1994/03/12, 10/08/1994, 01/20/1995   Influenza, Seasonal, Injecte, Preservative Fre 10/13/2006, 10/10/2007, 09/28/2008, 02/17/2010, 10/26/2011   MMR 09/22/1995, 09/26/1998   Meningococcal Conjugate 07/06/2006, 07/23/2010   OPV 10/08/1994, 11/26/1994, 09/22/1995, 09/26/1998   Tdap 08/24/2005, 10/03/2016, 01/14/2018, 05/01/2021, 04/23/2023, 11/16/2023   Varicella 09/22/1995, 09/26/1998    Health Maintenance  Topic  Date Due   Pneumococcal Vaccine (1 of 2 - PCV) Never done   Influenza Vaccine  07/13/2024   COVID-19 Vaccine (1 - 2024-25 season) Never done   DTaP/Tdap/Td (12 - Td or Tdap) 11/15/2033   Hepatitis B Vaccines 19-59 Average Risk  Completed   HPV VACCINES  Completed   Hepatitis C Screening  Completed   HIV Screening  Completed   Meningococcal B Vaccine  Aged Out    Patient Care Team: Pcp, No as PCP - General  Review of Systems  Last CBC Lab Results  Component Value Date   WBC 4.4 08/11/2024   HGB 14.7 08/11/2024   HCT 43.4 08/11/2024   MCV 99.3 08/11/2024   MCH 33.6 08/11/2024   RDW 12.2 08/11/2024   PLT 215 08/11/2024   Last metabolic panel Lab Results  Component Value Date   GLUCOSE 108 (H) 08/11/2024   NA 143 08/11/2024   K 4.1 08/11/2024   CL 115 (H) 08/11/2024   CO2 22 08/11/2024   BUN <5 (L) 08/11/2024   CREATININE 0.49 (L) 08/11/2024   GFRNONAA >60 08/11/2024   CALCIUM 7.7 (L) 08/11/2024   PROT 7.5 08/11/2024   ALBUMIN 3.6 08/11/2024   BILITOT 1.1 08/11/2024   ALKPHOS 52 08/11/2024   AST 433 (H) 08/11/2024   ALT 333 (H) 08/11/2024   ANIONGAP 6 08/11/2024   Last lipids No results found for: CHOL, HDL, LDLCALC, LDLDIRECT, TRIG, CHOLHDL Last hemoglobin A1c No results found for: HGBA1C Last thyroid functions No results found for: TSH, T3TOTAL, T4TOTAL, THYROIDAB Last vitamin D No results found for: 25OHVITD2, 25OHVITD3, VD25OH Last vitamin B12 and Folate No results found for: VITAMINB12, FOLATE   {See past labs  Heme  Chem  Endocrine  Serology  Results Review (optional):1}   Objective    There were no vitals taken for this visit. BP Readings from Last 3 Encounters:  08/14/24 135/88  08/11/24 116/79  08/06/24 (!) 155/99   Wt Readings from Last 3 Encounters:  08/14/24 182 lb (82.6 kg)  08/11/24 209 lb 3.5 oz (94.9 kg)  08/05/24 176 lb 2.4 oz (79.9 kg)    {See vitals history (optional):1}    Depression  Screen     No data to display         No results found for any visits on 08/23/24.   Physical Exam ***    Assessment & Plan      Problem List Items Addressed This Visit   None   Assessment and Plan Assessment & Plan       No follow-ups on file.      Rockie Agent, MD  [facility] [phone] (phone) [phone] (fax)  [facility]

## 2024-08-24 ENCOUNTER — Telehealth: Payer: Self-pay

## 2024-08-24 ENCOUNTER — Other Ambulatory Visit (HOSPITAL_COMMUNITY): Payer: Self-pay

## 2024-08-24 ENCOUNTER — Other Ambulatory Visit: Payer: Self-pay | Admitting: Pharmacist

## 2024-08-24 NOTE — Progress Notes (Signed)
 Spoke with patient today. Provided education and counseling about new-start Epclusa .

## 2024-08-24 NOTE — Telephone Encounter (Addendum)
 [facility] Patient Advocate Encounter  Patient's medications EPCLUSA  have been couriered to [facility] from [facility] and will be picked up at  [facility].  Diminique will take back to [employer] [facility] office so that the patient can have an easier pick-up   Charmaine Sharps, CPhT Specialty Pharmacy Patient [facility]ious [facility] Phone: [phone] Fax:  [phone]

## 2024-08-24 NOTE — Telephone Encounter (Signed)
 Spoke with Daril Ferns on the phone today for medication counseling on new-start Eplcusa.   Explained that Eplcusa is taking as a single tablet once daily with a meal to avoid GI side effects (e.g., nausea). Counseled on potential start-up syndrome with symptoms of fatigue and headache that could occur within the first 2-3 days of beginning therapy. Encouraged him to continue the medication because symptoms will diminish after 1-2 weeks of continued therapy.   Discussed the importance of taking his medication for the full 12 weeks of therapy and to avoid missed doses. If a dose is missed, told him to not double up doses to avoid adverse effects and toxicity.  Discussed that he be able to pick-up his medication from the [facility] clinic next week once it is delivered. Educated him on the importance of showing up for follow-up appointments, so that we were able to have updated blood work and monitoring.   Provided direct call back number for any further questions or concerns.   Woodie Jock, PharmD PGY1 Pharmacy Resident  08/24/2024

## 2024-08-24 NOTE — Progress Notes (Signed)
 Specialty Pharmacy Initiation Note   Lawrence Griffith. is a 28 y.o. male who will be followed by the specialty pharmacy service for RxSp Hepatitis C    Review of administration, indication, effectiveness, safety, potential side effects, storage/disposable, and missed dose instructions occurred today for patient's specialty medication(s) Sofosbuvir -Velpatasvir      Patient/Caregiver did not have any additional questions or concerns.   Patient's therapy is appropriate to: Initiate    Goals Addressed             This Visit's Progress    Achieve virologic cure as evidenced by SVR       Patient is initiating therapy. Patient will be evaluated at upcoming provider appointment to assess progress      Comply with lab assessments       Patient is on track. Patient will adhere to provider and/or lab appointments      Maintain optimal adherence to therapy       Patient is initiating therapy. Patient will be evaluated at upcoming provider appointment to assess progress         Alan JINNY Geralds Specialty Pharmacist

## 2024-08-27 ENCOUNTER — Other Ambulatory Visit (HOSPITAL_COMMUNITY): Payer: Self-pay

## 2024-08-27 ENCOUNTER — Emergency Department
Admission: EM | Admit: 2024-08-27 | Discharge: 2024-08-27 | Disposition: A | Discharge status: Home / Self Care | Payer: MEDICAID | Attending: Emergency Medicine | Admitting: Emergency Medicine

## 2024-08-27 ENCOUNTER — Other Ambulatory Visit: Payer: Self-pay

## 2024-08-27 ENCOUNTER — Emergency Department: Payer: MEDICAID

## 2024-08-27 DIAGNOSIS — W19XXXA Unspecified fall, initial encounter: Secondary | ICD-10-CM

## 2024-08-27 DIAGNOSIS — M25552 Pain in left hip: Secondary | ICD-10-CM | POA: Insufficient documentation

## 2024-08-27 DIAGNOSIS — S0990XA Unspecified injury of head, initial encounter: Secondary | ICD-10-CM

## 2024-08-27 DIAGNOSIS — J45909 Unspecified asthma, uncomplicated: Secondary | ICD-10-CM | POA: Diagnosis not present

## 2024-08-27 DIAGNOSIS — F10129 Alcohol abuse with intoxication, unspecified: Secondary | ICD-10-CM | POA: Insufficient documentation

## 2024-08-27 DIAGNOSIS — F10920 Alcohol use, unspecified with intoxication, uncomplicated: Secondary | ICD-10-CM

## 2024-08-27 DIAGNOSIS — S0001XA Abrasion of scalp, initial encounter: Secondary | ICD-10-CM | POA: Insufficient documentation

## 2024-08-27 DIAGNOSIS — S60812A Abrasion of left wrist, initial encounter: Secondary | ICD-10-CM | POA: Diagnosis not present

## 2024-08-27 DIAGNOSIS — S50312A Abrasion of left elbow, initial encounter: Secondary | ICD-10-CM | POA: Diagnosis not present

## 2024-08-27 DIAGNOSIS — Y908 Blood alcohol level of 240 mg/100 ml or more: Secondary | ICD-10-CM | POA: Diagnosis not present

## 2024-08-27 DIAGNOSIS — Z23 Encounter for immunization: Secondary | ICD-10-CM | POA: Insufficient documentation

## 2024-08-27 DIAGNOSIS — S40812A Abrasion of left upper arm, initial encounter: Secondary | ICD-10-CM

## 2024-08-27 DIAGNOSIS — M25559 Pain in unspecified hip: Secondary | ICD-10-CM

## 2024-08-27 LAB — ETHANOL: Alcohol, Ethyl (B): 387 mg/dL (ref <15)

## 2024-08-27 LAB — CBC
HCT: 43.6 % (ref 39.0–52.0)
Hemoglobin: 15.4 g/dL (ref 13.0–17.0)
MCH: 34.1 pg — ABNORMAL HIGH (ref 26.0–34.0)
MCHC: 35.3 g/dL (ref 30.0–36.0)
MCV: 96.5 fL (ref 80.0–100.0)
Platelets: 217 K/uL (ref 150–400)
RBC: 4.52 MIL/uL (ref 4.22–5.81)
RDW: 11.7 % (ref 11.5–15.5)
WBC: 7.1 K/uL (ref 4.0–10.5)
nRBC: 0 % (ref 0.0–0.2)

## 2024-08-27 LAB — BASIC METABOLIC PANEL WITH GFR
Anion gap: 12 (ref 5–15)
BUN: <5 mg/dL — ABNORMAL LOW (ref 6–20)
CO2: 28 mmol/L (ref 22–32)
Calcium: 8.9 mg/dL (ref 8.9–10.3)
Chloride: 98 mmol/L (ref 98–111)
Creatinine, Ser: 0.66 mg/dL (ref 0.61–1.24)
GFR, Estimated: >60 mL/min (ref >60)
Glucose, Bld: 101 mg/dL — ABNORMAL HIGH (ref 70–99)
Potassium: 4 mmol/L (ref 3.5–5.1)
Sodium: 138 mmol/L (ref 135–145)

## 2024-08-27 MED ORDER — ACETAMINOPHEN 500 MG PO TABS
1000.0000 mg | ORAL_TABLET | Freq: Once | ORAL | Status: AC
  Administered 2024-08-27: 1000 mg via ORAL
  Filled 2024-08-27: qty 2

## 2024-08-27 MED ORDER — TETANUS-DIPHTH-ACELL PERTUSSIS 5-2.5-18.5 LF-MCG/0.5 IM SUSY
0.5000 mL | PREFILLED_SYRINGE | Freq: Once | INTRAMUSCULAR | Status: AC
  Administered 2024-08-27: 0.5 mL via INTRAMUSCULAR
  Filled 2024-08-27: qty 0.5

## 2024-08-27 MED ORDER — LIDOCAINE 5 % EX PTCH: 1.0000 | MEDICATED_PATCH | CUTANEOUS | 0 refills | Status: AC

## 2024-08-27 MED ORDER — LIDOCAINE 5 % EX PTCH
1.0000 | MEDICATED_PATCH | CUTANEOUS | Status: DC
  Administered 2024-08-27: 1 via TRANSDERMAL
  Filled 2024-08-27: qty 1

## 2024-08-27 NOTE — ED Notes (Signed)
Pt  vol 
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
## 2024-08-27 NOTE — ED Provider Notes (Signed)
.-----------------------------------------   3:08 PM on 08/27/2024 -----------------------------------------  Blood pressure 123/83, pulse 93, temperature 98.5 F (36.9 C), temperature source Oral, resp. rate 17, SpO2 98%.  Assuming care from Dr. Willo.  In short, Lawrence Griffith. is a 28 y.o. male with a chief complaint of Alcohol Intoxication .  Refer to the original H&P for additional details.  The current plan of care is to follow up XR, observation for sobriety.  Imaging reassuring, no acute traumatic injury.  Patient is ambulatory to the bathroom and still slightly unsteady, tolerating p.o.  Is more clinically sober now than 5 hours ago.  He is on the phone with mom and she will come and pick him up.  Will discharge him into mom's care.    Mom is here to pick him up.  Patient's heart rate on my assessment was 106, he has no tremors or tongue fasciculations.  States that he also needs to go to the pharmacy to pick up some medications that were sent to him by another doctor.  Discussed with him about sending a prescription for Lidoderm  patches, he can take Tylenol  as needed for his hip pain and abrasions.  Also put in a primary care doctor referral.  Otherwise considered but no indication for inpatient admission at this time, he safe for outpatient management.  Will discharge with strict return precautions.       Lawrence Lorelle Cummins, MD 08/27/24 [phone]

## 2024-08-27 NOTE — ED Notes (Signed)
 Assisted patient to BR.  Tolerated well.

## 2024-08-27 NOTE — ED Notes (Signed)
 Md at bedside to eval pt, pt is ok for discharge, read and reviewed d/c instructions and follow up, pt from department via wheel chair, pt with mother and family,

## 2024-08-27 NOTE — ED Notes (Addendum)
 Pt ambulatory to the bathroom, pt is unsteady on his feet. Pt talking with his mother on the phone

## 2024-08-27 NOTE — ED Notes (Addendum)
 Box lunch given, md notified of heart rate

## 2024-08-27 NOTE — Discharge Instructions (Addendum)
 You can use the Lidoderm  patches as needed for your hip pain.  Also take 650 mg of Tylenol  every 6 hours as needed.  I put in a referral for primary care for you, please make sure to follow-up.

## 2024-08-30 ENCOUNTER — Other Ambulatory Visit (HOSPITAL_COMMUNITY): Payer: Self-pay

## 2024-08-30 ENCOUNTER — Other Ambulatory Visit: Payer: Self-pay

## 2024-09-13 ENCOUNTER — Other Ambulatory Visit: Payer: Self-pay

## 2024-09-13 NOTE — Progress Notes (Signed)
 Specialty Pharmacy Refill Coordination Note  Lawrence Griffith. is a 28 y.o. male contacted today regarding refills of specialty medication(s) Sofosbuvir -Velpatasvir    Patient requested Delivery   Delivery date: 09/20/24   Verified address: [address] [place]   Medication will be filled on 10.08.25.

## 2024-09-19 ENCOUNTER — Other Ambulatory Visit: Payer: Self-pay

## 2024-09-27 ENCOUNTER — Ambulatory Visit: Payer: MEDICAID | Admitting: Infectious Diseases

## 2024-10-01 ENCOUNTER — Other Ambulatory Visit: Payer: Self-pay

## 2024-10-03 ENCOUNTER — Emergency Department
Admission: EM | Admit: 2024-10-03 | Discharge: 2024-10-03 | Disposition: A | Discharge status: Home / Self Care | Payer: MEDICAID | Attending: Emergency Medicine | Admitting: Emergency Medicine

## 2024-10-03 ENCOUNTER — Emergency Department: Payer: MEDICAID

## 2024-10-03 ENCOUNTER — Other Ambulatory Visit: Payer: Self-pay

## 2024-10-03 DIAGNOSIS — S92154A Nondisplaced avulsion fracture (chip fracture) of right talus, initial encounter for closed fracture: Secondary | ICD-10-CM | POA: Insufficient documentation

## 2024-10-03 DIAGNOSIS — M79671 Pain in right foot: Secondary | ICD-10-CM | POA: Diagnosis present

## 2024-10-03 DIAGNOSIS — Y92410 Unspecified street and highway as the place of occurrence of the external cause: Secondary | ICD-10-CM | POA: Insufficient documentation

## 2024-10-03 MED ORDER — MELOXICAM 15 MG PO TABS: 15.0000 mg | ORAL_TABLET | Freq: Every day | ORAL | 0 refills | Status: AC

## 2024-10-03 MED ORDER — KETOROLAC TROMETHAMINE 15 MG/ML IJ SOLN
15.0000 mg | Freq: Once | INTRAMUSCULAR | Status: AC
  Administered 2024-10-03: 15 mg via INTRAMUSCULAR
  Filled 2024-10-03: qty 1

## 2024-10-03 MED ORDER — HYDROCODONE-ACETAMINOPHEN 5-325 MG PO TABS
1.0000 | ORAL_TABLET | Freq: Once | ORAL | Status: AC
  Administered 2024-10-03: 1 via ORAL
  Filled 2024-10-03: qty 1

## 2024-10-03 NOTE — Discharge Instructions (Signed)
 Apply ice to the affected area.  Remain in postop shoe and nonweightbearing status for 1 week. Follow up with podiatry if symptoms persist.  Pain control:  Ibuprofen  (motrin /aleve/advil ) - You can take 3 tablets (600 mg) every 6 hours as needed for pain.  Acetaminophen  (tylenol ) - You can take 2 extra strength tablets (1000 mg) every 6 hours as needed for pain.  You can alternate these medications or take them together.  Make sure you eat food/drink water when taking these medications.
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
## 2024-10-10 ENCOUNTER — Other Ambulatory Visit (HOSPITAL_COMMUNITY): Payer: Self-pay

## 2024-10-10 ENCOUNTER — Other Ambulatory Visit: Payer: Self-pay

## 2024-10-10 NOTE — Progress Notes (Signed)
 Specialty Pharmacy Refill Coordination Note  Lawrence Griffith. is a 28 y.o. male contacted today regarding refills of specialty medication(s) Sofosbuvir -Velpatasvir    Patient requested Delivery   Delivery date: 10/18/24   Verified address: [address] [place]   Medication will be filled on: 10/17/24

## 2024-10-17 ENCOUNTER — Other Ambulatory Visit: Payer: Self-pay

## 2024-11-07 ENCOUNTER — Other Ambulatory Visit (HOSPITAL_COMMUNITY): Payer: Self-pay

## 2024-11-13 ENCOUNTER — Emergency Department
Admission: EM | Admit: 2024-11-13 | Discharge: 2024-11-14 | Disposition: A | Payer: MEDICAID | Attending: Emergency Medicine | Admitting: Emergency Medicine

## 2024-11-13 DIAGNOSIS — R7989 Other specified abnormal findings of blood chemistry: Secondary | ICD-10-CM | POA: Insufficient documentation

## 2024-11-13 DIAGNOSIS — F10920 Alcohol use, unspecified with intoxication, uncomplicated: Secondary | ICD-10-CM

## 2024-11-13 DIAGNOSIS — Y908 Blood alcohol level of 240 mg/100 ml or more: Secondary | ICD-10-CM | POA: Insufficient documentation

## 2024-11-13 DIAGNOSIS — F10129 Alcohol abuse with intoxication, unspecified: Secondary | ICD-10-CM | POA: Insufficient documentation

## 2024-11-13 LAB — COMPREHENSIVE METABOLIC PANEL WITH GFR
ALT: 152 U/L — ABNORMAL HIGH (ref 0–44)
AST: 129 U/L — ABNORMAL HIGH (ref 15–41)
Albumin: 4.4 g/dL (ref 3.5–5.0)
Alkaline Phosphatase: 58 U/L (ref 38–126)
Anion gap: 14 (ref 5–15)
BUN: 5 mg/dL — ABNORMAL LOW (ref 6–20)
CO2: 25 mmol/L (ref 22–32)
Calcium: 9.2 mg/dL (ref 8.9–10.3)
Chloride: 105 mmol/L (ref 98–111)
Creatinine, Ser: 0.6 mg/dL — ABNORMAL LOW (ref 0.61–1.24)
GFR, Estimated: >60 mL/min (ref >60)
Glucose, Bld: 103 mg/dL — ABNORMAL HIGH (ref 70–99)
Potassium: 3.3 mmol/L — ABNORMAL LOW (ref 3.5–5.1)
Sodium: 145 mmol/L (ref 135–145)
Total Bilirubin: 0.3 mg/dL (ref 0.0–1.2)
Total Protein: 7.6 g/dL (ref 6.5–8.1)

## 2024-11-13 LAB — CBC
HCT: 44.7 % (ref 39.0–52.0)
Hemoglobin: 15.2 g/dL (ref 13.0–17.0)
MCH: 33.2 pg (ref 26.0–34.0)
MCHC: 34 g/dL (ref 30.0–36.0)
MCV: 97.6 fL (ref 80.0–100.0)
Platelets: 230 K/uL (ref 150–400)
RBC: 4.58 MIL/uL (ref 4.22–5.81)
RDW: 12 % (ref 11.5–15.5)
WBC: 5.7 K/uL (ref 4.0–10.5)
nRBC: 0 % (ref 0.0–0.2)

## 2024-11-13 LAB — ETHANOL: Alcohol, Ethyl (B): >450 mg/dL (ref <15)

## 2024-11-13 MED ORDER — SODIUM CHLORIDE 0.9 % IV BOLUS
1000.0000 mL | Freq: Once | INTRAVENOUS | Status: AC
  Administered 2024-11-13: 1000 mL via INTRAVENOUS

## 2024-11-13 MED ORDER — FOLIC ACID 5 MG/ML IJ SOLN
1.0000 mg | Freq: Once | INTRAMUSCULAR | Status: AC
  Administered 2024-11-14: 1 mg via INTRAVENOUS
  Filled 2024-11-13: qty 0.2

## 2024-11-13 MED ORDER — ONDANSETRON HCL 4 MG/2ML IJ SOLN
4.0000 mg | Freq: Once | INTRAMUSCULAR | Status: AC
  Administered 2024-11-13: 4 mg via INTRAVENOUS
  Filled 2024-11-13: qty 2

## 2024-11-13 MED ORDER — THIAMINE HCL 100 MG/ML IJ SOLN
100.0000 mg | Freq: Once | INTRAMUSCULAR | Status: AC
  Administered 2024-11-13: 100 mg via INTRAVENOUS
  Filled 2024-11-13: qty 2

## 2024-11-13 MED ORDER — SODIUM CHLORIDE 0.9 % IV SOLN: 1.0000 mg | Freq: Once | INTRAVENOUS | Status: DC
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
## 2024-11-13 NOTE — Discharge Instructions (Signed)
 Please limit your alcohol intake.  Please follow-up with your doctor as needed.  Return to the emergency department for any emergent symptoms or concerns.
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
## 2024-11-13 NOTE — ED Notes (Signed)
 CCMD called to initiate cardiac monitoring.

## 2024-11-14 LAB — URINE DRUG SCREEN
Amphetamines: NEGATIVE
Barbiturates: NEGATIVE
Benzodiazepines: NEGATIVE
Cocaine: NEGATIVE
Fentanyl: NEGATIVE
Methadone Scn, Ur: NEGATIVE
Opiates: NEGATIVE
Tetrahydrocannabinol: POSITIVE — AB

## 2024-11-14 MED ORDER — ACETAMINOPHEN 325 MG PO TABS
650.0000 mg | ORAL_TABLET | Freq: Once | ORAL | Status: AC
  Administered 2024-11-14: 650 mg via ORAL
  Filled 2024-11-14: qty 2

## 2024-11-14 MED ORDER — IBUPROFEN 800 MG PO TABS: 800.0000 mg | ORAL_TABLET | Freq: Three times a day (TID) | ORAL | 0 refills | Status: AC | PRN

## 2024-11-14 NOTE — ED Provider Notes (Signed)
-----------------------------------------   5:17 AM on 11/14/2024 -----------------------------------------  I took over care of this patient from Dr. Dorothyann.  The patient is now alert and clinically sober.  He is able to ambulate steadily.  He is stable for discharge.  The RN has called his mother who will come to pick him up.  Return precautions have been provided.   Jacolyn Pae, MD 11/14/24 [phone]

## 2024-11-14 NOTE — ED Notes (Signed)
 Pt ambulated without assistance to bathroom door and back to bed.

## 2024-11-14 NOTE — ED Notes (Signed)
 Per pt permission, this RN attempted to call pt's mother. No answer at this time.

## 2024-11-21 ENCOUNTER — Emergency Department: Payer: MEDICAID

## 2024-11-21 ENCOUNTER — Emergency Department
Admission: EM | Admit: 2024-11-21 | Discharge: 2024-11-21 | Disposition: A | Discharge status: Home / Self Care | Payer: MEDICAID | Attending: Emergency Medicine | Admitting: Emergency Medicine

## 2024-11-21 ENCOUNTER — Encounter: Payer: Self-pay | Admitting: Emergency Medicine

## 2024-11-21 ENCOUNTER — Other Ambulatory Visit: Payer: Self-pay

## 2024-11-21 DIAGNOSIS — T148XXA Other injury of unspecified body region, initial encounter: Secondary | ICD-10-CM

## 2024-11-21 DIAGNOSIS — W19XXXA Unspecified fall, initial encounter: Secondary | ICD-10-CM | POA: Diagnosis not present

## 2024-11-21 DIAGNOSIS — S8011XA Contusion of right lower leg, initial encounter: Secondary | ICD-10-CM | POA: Diagnosis not present

## 2024-11-21 DIAGNOSIS — F10920 Alcohol use, unspecified with intoxication, uncomplicated: Secondary | ICD-10-CM | POA: Insufficient documentation

## 2024-11-21 DIAGNOSIS — S60511A Abrasion of right hand, initial encounter: Secondary | ICD-10-CM | POA: Diagnosis not present

## 2024-11-21 DIAGNOSIS — S8991XA Unspecified injury of right lower leg, initial encounter: Secondary | ICD-10-CM | POA: Diagnosis present

## 2024-11-21 NOTE — ED Notes (Signed)
 Pt given new socks. Bed alarm on.

## 2024-11-21 NOTE — ED Notes (Signed)
 Pt discharged at this time. RN reviewed discharge instructions with pt. Pt verbalized understanding. RR even and unlabored. Pt denies any questions or needs at this time. Pt ambulatory at discharge. Pt picked up by father at this time.

## 2024-11-21 NOTE — ED Notes (Signed)
 X-ray at bedside.

## 2024-11-21 NOTE — ED Notes (Signed)
 Pt hand unwrapped, cleaned, and new gauze placed over lac.

## 2024-11-21 NOTE — Discharge Instructions (Signed)
 I put in the primary care doctor referral for you, please make sure to follow-up.

## 2024-11-21 NOTE — ED Notes (Signed)
 Patient disorganized during conversation. Unable to rate pain but states the pain brought him to the hospital. Vital signs obtained. States  at least I'm not dying. Cooperative with redirection and remaining in bed without issue.
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
## 2024-11-21 NOTE — ED Provider Notes (Signed)
 [facility] Provider Note    Event Date/Time   First MD Initiated Contact with Patient 11/21/24 0701     (approximate)   History   Alcohol Intoxication   HPI  Lawrence Griffith. is a 28 y.o. male with history of alcohol use presenting with alcohol intoxication.  Patient states that he drank alcohol overnight.  States that he did not fall although he told triage that he fell on concrete prior to arrival and sustained an abrasion to the top of his hand.  Has some dried blood to the top of his right hand.  Otherwise denies any pain.  States he did not hit his head.  States that he has some bruising to the right distal leg.  Able to ambulate.  On independent review, he has had multiple ED visits for alcohol intoxication.  History of hep C, last seen by ID in September.  Has history of consuming up to 30 12-ounce cans of beer daily.  Also drinks vodka.     Physical Exam   Triage Vital Signs: ED Triage Vitals  Encounter Vitals Group     BP 11/21/24 0528 (!) 157/120     Girls Systolic BP Percentile --      Girls Diastolic BP Percentile --      Boys Systolic BP Percentile --      Boys Diastolic BP Percentile --      Pulse Rate 11/21/24 0528 (!) 115     Resp 11/21/24 0528 19     Temp 11/21/24 0528 97.9 F (36.6 C)     Temp src --      SpO2 11/21/24 0528 100 %     Weight --      Height 11/21/24 0541 5' 8 (1.727 m)     Head Circumference --      Peak Flow --      Pain Score 11/21/24 0541 7     Pain Loc --      Pain Education --      Exclude from Growth Chart --     Most recent vital signs: Vitals:   11/21/24 0528 11/21/24 0632  BP: (!) 157/120 (!) 140/95  Pulse: (!) 115 93  Resp: 19 18  Temp: 97.9 F (36.6 C) 97.7 F (36.5 C)  SpO2: 100% 98%     General: Awake, no distress.  CV:  Good peripheral perfusion.  Resp:  Normal effort.  No tachypnea or respiratory distress Abd:  No distention.  Soft nontender Other:  He does have some old  bruising to his right distal leg, no bony tenderness, no tenderness of the rest of extremities.  He does have dried blood to the dorsum of his right hand, there is an abrasion and without open laceration.  no scaphoid tenderness, able to fully range his extremities, palpable radial pulses bilaterally, no focal weakness or numbness, no other scalp deformities.   ED Results / Procedures / Treatments   Labs (all labs ordered are listed, but only abnormal results are displayed) Labs Reviewed - No data to display   RADIOLOGY On my independent interpretation, x-ray without obvious fracture   PROCEDURES:  Critical Care performed: No  Procedures   MEDICATIONS ORDERED IN ED: Medications - No data to display   IMPRESSION / MDM / ASSESSMENT AND PLAN / ED COURSE  I reviewed the triage vital signs and the nursing notes.  Differential diagnosis includes, but is not limited to, alcohol intoxication, contusion, strain, sprain, fracture.  Will get x-rays of his hand and leg.  Will monitor him for sobriety.  Social determinants of health, alcohol use  Patient's presentation is most consistent with acute presentation with potential threat to life or bodily function.  Independent interpretation of imaging below.  X-rays are reassuring without acute traumatic findings.  Patient is now awake alert, tolerating p.o.  Considered but no indication for inpatient admission at this time, he safe for outpatient management.  Will discharge with strict return precautions.  Instructed to follow-up with primary care doctor this week to get reassessed, did place a primary care doctor referral for him.    Clinical Course as of 11/21/24 0753  Wed Nov 21, 2024  0750 DG Tibia/Fibula Right Negative [TT]  0750 DG Hand Complete Right IMPRESSION: 1. No acute fracture or dislocation. 2. Orthopedic screw in the scaphoid bone, consistent with a healed prior scaphoid fracture. This is  not optimally visualized on a hand series. If there are localizing symptoms, a right wrist series should be performed. 3. Mild dorsal soft tissue swelling.   [TT]    Clinical Course User Index [TT] Waymond Lorelle Cummins, MD     FINAL CLINICAL IMPRESSION(S) / ED DIAGNOSES   Final diagnoses:  Alcoholic intoxication without complication  Bruising  Abrasion of right hand, initial encounter     Rx / DC Orders   ED Discharge Orders          Ordered    Ambulatory Referral to Primary Care (Establish Care)        11/21/24 0751             Note:  This document was prepared using Dragon voice recognition software and may include unintentional dictation errors.    Waymond Lorelle Cummins, MD 11/21/24 [phone]

## 2024-11-29 ENCOUNTER — Emergency Department: Payer: MEDICAID

## 2024-11-29 ENCOUNTER — Emergency Department: Admission: EM | Admit: 2024-11-29 | Payer: MEDICAID | Source: Home / Self Care

## 2024-11-29 DIAGNOSIS — S0191XA Laceration without foreign body of unspecified part of head, initial encounter: Secondary | ICD-10-CM | POA: Insufficient documentation

## 2024-11-29 DIAGNOSIS — Y908 Blood alcohol level of 240 mg/100 ml or more: Secondary | ICD-10-CM | POA: Diagnosis not present

## 2024-11-29 DIAGNOSIS — R45851 Suicidal ideations: Secondary | ICD-10-CM | POA: Diagnosis not present

## 2024-11-29 DIAGNOSIS — F1020 Alcohol dependence, uncomplicated: Secondary | ICD-10-CM | POA: Diagnosis present

## 2024-11-29 DIAGNOSIS — S1191XA Laceration without foreign body of unspecified part of neck, initial encounter: Secondary | ICD-10-CM | POA: Diagnosis not present

## 2024-11-29 DIAGNOSIS — Z23 Encounter for immunization: Secondary | ICD-10-CM | POA: Insufficient documentation

## 2024-11-29 DIAGNOSIS — X838XXA Intentional self-harm by other specified means, initial encounter: Secondary | ICD-10-CM | POA: Diagnosis not present

## 2024-11-29 DIAGNOSIS — F1010 Alcohol abuse, uncomplicated: Secondary | ICD-10-CM | POA: Insufficient documentation

## 2024-11-29 DIAGNOSIS — S199XXA Unspecified injury of neck, initial encounter: Secondary | ICD-10-CM | POA: Diagnosis present

## 2024-11-29 LAB — BASIC METABOLIC PANEL WITH GFR
Anion gap: 13 (ref 5–15)
BUN: <5 mg/dL — ABNORMAL LOW (ref 6–20)
CO2: 26 mmol/L (ref 22–32)
Calcium: 9.1 mg/dL (ref 8.9–10.3)
Chloride: 102 mmol/L (ref 98–111)
Creatinine, Ser: 0.71 mg/dL (ref 0.61–1.24)
GFR, Estimated: >60 mL/min (ref >60)
Glucose, Bld: 91 mg/dL (ref 70–99)
Potassium: 3.6 mmol/L (ref 3.5–5.1)
Sodium: 141 mmol/L (ref 135–145)

## 2024-11-29 LAB — CBC
HCT: 47.3 % (ref 39.0–52.0)
Hemoglobin: 16.4 g/dL (ref 13.0–17.0)
MCH: 33.3 pg (ref 26.0–34.0)
MCHC: 34.7 g/dL (ref 30.0–36.0)
MCV: 96.1 fL (ref 80.0–100.0)
Platelets: 211 K/uL (ref 150–400)
RBC: 4.92 MIL/uL (ref 4.22–5.81)
RDW: 12.2 % (ref 11.5–15.5)
WBC: 5.6 K/uL (ref 4.0–10.5)
nRBC: 0 % (ref 0.0–0.2)

## 2024-11-29 LAB — ETHANOL: Alcohol, Ethyl (B): 397 mg/dL (ref <15)

## 2024-11-29 MED ORDER — LIDOCAINE-EPINEPHRINE 2 %-1:100000 IJ SOLN
20.0000 mL | Freq: Once | INTRAMUSCULAR | Status: AC
  Administered 2024-11-29: 22:00:00 20 mL via INTRADERMAL
  Filled 2024-11-29: qty 1

## 2024-11-29 MED ORDER — ACETAMINOPHEN 325 MG PO TABS
975.0000 mg | ORAL_TABLET | Freq: Once | ORAL | Status: AC
  Administered 2024-11-29: 22:00:00 975 mg via ORAL
  Filled 2024-11-29: qty 3

## 2024-11-29 MED ORDER — IOHEXOL 350 MG/ML SOLN
75.0000 mL | Freq: Once | INTRAVENOUS | Status: AC | PRN
  Administered 2024-11-29: 23:00:00 75 mL via INTRAVENOUS

## 2024-11-29 MED ORDER — TETANUS-DIPHTH-ACELL PERTUSSIS 5-2-15.5 LF-MCG/0.5 IM SUSP
0.5000 mL | Freq: Once | INTRAMUSCULAR | Status: AC
  Administered 2024-11-29: 22:00:00 0.5 mL via INTRAMUSCULAR
  Filled 2024-11-29: qty 0.5
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
## 2024-11-29 NOTE — ED Notes (Signed)
 Fall risk bundle is currently in place.

## 2024-11-29 NOTE — ED Notes (Addendum)
 Pt moved to room 20 after suture repair complete. Ambulatory with steady gait. Pt dressed out in burgundy scrubs and personal belongings secured without issue.

## 2024-11-29 NOTE — ED Notes (Signed)
 Pt given water and warm blanket no other needs at this time
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
## 2024-11-29 NOTE — ED Notes (Addendum)
 Pt belongings consist of:  Black shirt  Jeans Belt White socks Cowboy boots Black underwear.
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
## 2024-11-30 ENCOUNTER — Inpatient Hospital Stay (HOSPITAL_COMMUNITY)
Admission: AD | Admit: 2024-11-30 | Discharge: 2024-12-05 | Disposition: A | Discharge status: Home / Self Care | Payer: MEDICAID | Source: Intra-hospital | Attending: Student in an Organized Health Care Education/Training Program | Admitting: Student in an Organized Health Care Education/Training Program

## 2024-11-30 ENCOUNTER — Other Ambulatory Visit: Payer: Self-pay

## 2024-11-30 ENCOUNTER — Encounter (HOSPITAL_COMMUNITY): Payer: Self-pay

## 2024-11-30 DIAGNOSIS — F1020 Alcohol dependence, uncomplicated: Secondary | ICD-10-CM | POA: Diagnosis not present

## 2024-11-30 DIAGNOSIS — F17210 Nicotine dependence, cigarettes, uncomplicated: Secondary | ICD-10-CM | POA: Diagnosis present

## 2024-11-30 DIAGNOSIS — X838XXA Intentional self-harm by other specified means, initial encounter: Secondary | ICD-10-CM | POA: Diagnosis not present

## 2024-11-30 DIAGNOSIS — E559 Vitamin D deficiency, unspecified: Secondary | ICD-10-CM | POA: Diagnosis present

## 2024-11-30 DIAGNOSIS — T1491XA Suicide attempt, initial encounter: Secondary | ICD-10-CM | POA: Diagnosis present

## 2024-11-30 DIAGNOSIS — Z882 Allergy status to sulfonamides status: Secondary | ICD-10-CM

## 2024-11-30 DIAGNOSIS — F79 Unspecified intellectual disabilities: Secondary | ICD-10-CM

## 2024-11-30 DIAGNOSIS — B1920 Unspecified viral hepatitis C without hepatic coma: Secondary | ICD-10-CM | POA: Diagnosis present

## 2024-11-30 DIAGNOSIS — F411 Generalized anxiety disorder: Secondary | ICD-10-CM | POA: Diagnosis present

## 2024-11-30 DIAGNOSIS — F10229 Alcohol dependence with intoxication, unspecified: Principal | ICD-10-CM | POA: Diagnosis present

## 2024-11-30 DIAGNOSIS — R480 Dyslexia and alexia: Secondary | ICD-10-CM | POA: Diagnosis present

## 2024-11-30 DIAGNOSIS — Z79899 Other long term (current) drug therapy: Secondary | ICD-10-CM | POA: Diagnosis not present

## 2024-11-30 DIAGNOSIS — F1010 Alcohol abuse, uncomplicated: Secondary | ICD-10-CM | POA: Diagnosis not present

## 2024-11-30 DIAGNOSIS — Z880 Allergy status to penicillin: Secondary | ICD-10-CM | POA: Diagnosis not present

## 2024-11-30 DIAGNOSIS — Z881 Allergy status to other antibiotic agents status: Secondary | ICD-10-CM

## 2024-11-30 DIAGNOSIS — Y908 Blood alcohol level of 240 mg/100 ml or more: Secondary | ICD-10-CM | POA: Diagnosis present

## 2024-11-30 DIAGNOSIS — Z23 Encounter for immunization: Secondary | ICD-10-CM

## 2024-11-30 DIAGNOSIS — Z5900 Homelessness unspecified: Secondary | ICD-10-CM

## 2024-11-30 DIAGNOSIS — R7401 Elevation of levels of liver transaminase levels: Secondary | ICD-10-CM | POA: Diagnosis not present

## 2024-11-30 DIAGNOSIS — Z789 Other specified health status: Secondary | ICD-10-CM

## 2024-11-30 DIAGNOSIS — F17200 Nicotine dependence, unspecified, uncomplicated: Secondary | ICD-10-CM | POA: Insufficient documentation

## 2024-11-30 DIAGNOSIS — J101 Influenza due to other identified influenza virus with other respiratory manifestations: Secondary | ICD-10-CM | POA: Diagnosis present

## 2024-11-30 DIAGNOSIS — R45851 Suicidal ideations: Secondary | ICD-10-CM | POA: Diagnosis present

## 2024-11-30 DIAGNOSIS — F88 Other disorders of psychological development: Secondary | ICD-10-CM | POA: Diagnosis present

## 2024-11-30 DIAGNOSIS — F10239 Alcohol dependence with withdrawal, unspecified: Secondary | ICD-10-CM | POA: Diagnosis not present

## 2024-11-30 DIAGNOSIS — S1191XA Laceration without foreign body of unspecified part of neck, initial encounter: Secondary | ICD-10-CM | POA: Diagnosis not present

## 2024-11-30 DIAGNOSIS — R4588 Nonsuicidal self-harm: Secondary | ICD-10-CM | POA: Diagnosis not present

## 2024-11-30 MED ORDER — MAGNESIUM HYDROXIDE 400 MG/5ML PO SUSP: 30.0000 mL | Freq: Every day | ORAL | Status: DC | PRN

## 2024-11-30 MED ORDER — LORAZEPAM 2 MG/ML IJ SOLN: 2.0000 mg | Freq: Three times a day (TID) | INTRAMUSCULAR | Status: DC | PRN

## 2024-11-30 MED ORDER — LORAZEPAM 2 MG PO TABS: 0.0000 mg | ORAL_TABLET | Freq: Four times a day (QID) | ORAL | Status: DC

## 2024-11-30 MED ORDER — LORAZEPAM 2 MG PO TABS: 0.0000 mg | ORAL_TABLET | Freq: Two times a day (BID) | ORAL | Status: DC

## 2024-11-30 MED ORDER — THIAMINE MONONITRATE 100 MG PO TABS
100.0000 mg | ORAL_TABLET | Freq: Every day | ORAL | Status: DC
  Administered 2024-11-30: 100 mg via ORAL
  Filled 2024-11-30: qty 1

## 2024-11-30 MED ORDER — THIAMINE HCL 100 MG/ML IJ SOLN: 100.0000 mg | Freq: Every day | INTRAMUSCULAR | Status: DC

## 2024-11-30 MED ORDER — HALOPERIDOL 5 MG PO TABS: 5.0000 mg | ORAL_TABLET | Freq: Three times a day (TID) | ORAL | Status: DC | PRN

## 2024-11-30 MED ORDER — LORAZEPAM 1 MG PO TABS
0.0000 mg | ORAL_TABLET | Freq: Two times a day (BID) | ORAL | Status: AC
  Administered 2024-12-02 – 2024-12-04 (×2): 1 mg via ORAL
  Filled 2024-11-30 (×3): qty 1

## 2024-11-30 MED ORDER — HALOPERIDOL LACTATE 5 MG/ML IJ SOLN: 10.0000 mg | Freq: Three times a day (TID) | INTRAMUSCULAR | Status: DC | PRN

## 2024-11-30 MED ORDER — ADULT MULTIVITAMIN W/MINERALS CH
1.0000 | ORAL_TABLET | Freq: Every day | ORAL | Status: DC
  Administered 2024-11-30: 1 via ORAL
  Filled 2024-11-30: qty 1

## 2024-11-30 MED ORDER — ADULT MULTIVITAMIN W/MINERALS CH
1.0000 | ORAL_TABLET | Freq: Every day | ORAL | Status: DC
  Administered 2024-12-01 – 2024-12-05 (×5): 1 via ORAL
  Filled 2024-11-30 (×5): qty 1

## 2024-11-30 MED ORDER — DIPHENHYDRAMINE HCL 50 MG/ML IJ SOLN: 50.0000 mg | Freq: Three times a day (TID) | INTRAMUSCULAR | Status: DC | PRN

## 2024-11-30 MED ORDER — LORAZEPAM 2 MG/ML IJ SOLN: 1.0000 mg | INTRAMUSCULAR | Status: DC | PRN

## 2024-11-30 MED ORDER — FOLIC ACID 1 MG PO TABS
1.0000 mg | ORAL_TABLET | Freq: Every day | ORAL | Status: DC
  Administered 2024-12-01 – 2024-12-05 (×5): 1 mg via ORAL
  Filled 2024-11-30 (×5): qty 1

## 2024-11-30 MED ORDER — LORAZEPAM 1 MG PO TABS: 1.0000 mg | ORAL_TABLET | ORAL | Status: DC | PRN

## 2024-11-30 MED ORDER — HALOPERIDOL LACTATE 5 MG/ML IJ SOLN: 5.0000 mg | Freq: Three times a day (TID) | INTRAMUSCULAR | Status: DC | PRN

## 2024-11-30 MED ORDER — DIPHENHYDRAMINE HCL 25 MG PO CAPS: 50.0000 mg | ORAL_CAPSULE | Freq: Three times a day (TID) | ORAL | Status: DC | PRN

## 2024-11-30 MED ORDER — VITAMIN B-1 100 MG PO TABS
100.0000 mg | ORAL_TABLET | Freq: Every day | ORAL | Status: DC
  Administered 2024-12-01 – 2024-12-05 (×5): 100 mg via ORAL
  Filled 2024-11-30 (×5): qty 1

## 2024-11-30 MED ORDER — ALUM & MAG HYDROXIDE-SIMETH 200-200-20 MG/5ML PO SUSP: 30.0000 mL | ORAL | Status: DC | PRN

## 2024-11-30 MED ORDER — DIPHENHYDRAMINE HCL 25 MG PO CAPS
50.0000 mg | ORAL_CAPSULE | Freq: Three times a day (TID) | ORAL | Status: DC | PRN
  Filled 2024-11-30: qty 2

## 2024-11-30 MED ORDER — LORAZEPAM 1 MG PO TABS
0.0000 mg | ORAL_TABLET | Freq: Four times a day (QID) | ORAL | Status: AC
  Administered 2024-11-30 – 2024-12-02 (×4): 1 mg via ORAL
  Filled 2024-11-30 (×3): qty 1

## 2024-11-30 MED ORDER — NICOTINE 14 MG/24HR TD PT24
14.0000 mg | MEDICATED_PATCH | Freq: Every day | TRANSDERMAL | Status: DC
  Administered 2024-12-01 – 2024-12-05 (×5): 14 mg via TRANSDERMAL
  Filled 2024-11-30 (×5): qty 1

## 2024-11-30 MED ORDER — FOLIC ACID 1 MG PO TABS
1.0000 mg | ORAL_TABLET | Freq: Every day | ORAL | Status: DC
  Administered 2024-11-30: 1 mg via ORAL
  Filled 2024-11-30: qty 1

## 2024-11-30 NOTE — ED Notes (Signed)
 TTS at the bedside for pt evaluation

## 2024-11-30 NOTE — ED Provider Notes (Signed)
 Emergency Medicine Observation Re-evaluation Note   BP (!) 142/91 (BP Location: Left Arm)   Pulse 82   Temp 98.4 F (36.9 C) (Oral)   Resp 18   Ht 5' 8 (1.727 m)   Wt 83.5 kg   SpO2 100%   BMI 27.98 kg/m    ED Course / MDM   No reported events during my shift at the time of this note.   Pt is awaiting dispo from consultants   Ginnie Shams MD    Shams Ginnie, MD 11/30/24 [phone]

## 2024-11-30 NOTE — ED Provider Notes (Addendum)
----------------------------------------- °  3:43 PM on 11/30/2024 -----------------------------------------  Patient is awaiting psychiatric disposition.  The patient was initially evaluated by Dr. Fernand yesterday.  He is medically cleared for inpatient admission to psychiatry.      Jacolyn Pae, MD 11/30/24 1544

## 2024-11-30 NOTE — Discharge Instructions (Signed)

## 2024-11-30 NOTE — ED Notes (Signed)
Pt given lunch tray and beverage 

## 2024-11-30 NOTE — BH Assessment (Signed)
 Attempt was made to assess patient but patient is currently too intoxicated to participate, he is oriented to where he is but cannot recall what happened and what brought him in. Patient also has difficulty staying awake. Psyc team to assess when patient is able to participate.

## 2024-11-30 NOTE — Group Note (Signed)
 Date:  11/30/2024 Time:  8:39 PM  Group Topic/Focus:  Neurotransmitters and their role in moods and behaviors. How psych meds affect them and for which symptoms. The gut flora's role with neurotransmitters.    Participation Level:  Did Not Attend  [facility] Huddle 11/30/2024, 8:39 PM

## 2024-11-30 NOTE — ED Notes (Signed)
 Dinner tray provided to pt

## 2024-11-30 NOTE — ED Notes (Signed)
 Patient belongings given to [facility] for transport to next facility

## 2024-11-30 NOTE — ED Notes (Signed)
 IVC/pending psych consult
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
## 2024-11-30 NOTE — TOC Initial Note (Addendum)
 Transition of Care (TOC) - Initial/Assessment Note    Patient Details  Name: Lawrence Griffith. MRN: [id number] Date of Birth: January 15, 1996  Transition of Care [facility]) CM/SW Contact:    Paislyn Domenico L [place] Monds, LCSW Phone Number: 11/30/2024, 4:58 PM  Clinical Narrative:                  [facility] consult for substance abuse education/counseling. TOC does not provide education/counseling. Resources added to the AVS. Patient has received these resources in prior ED/Admissions.    The patient is currently experiencing homelessness alongside family members. Multiple relatives are residing together in a vehicle. Each family member reportedly receives SSI and/or SSDI benefits.  Based on available information, the family appears to have access to financial resources that could support temporary lodging, such as a hotel, or facilitate pursuit of more permanent housing options. However, at this time, the family has elected not to utilize these options.   Adult Protective Services contacted.    Patient Goals and CMS Choice            Expected Discharge Plan and Services                                              Prior Living Arrangements/Services                       Activities of Daily Living      Permission Sought/Granted                  Emotional Assessment              Admission diagnosis:  Suicidal Patient Active Problem List   Diagnosis Date Noted   Intentional self-harm (HCC) 11/30/2024   Suicidal ideation 11/30/2024   Alcohol withdrawal seizure (HCC) 08/05/2024   Alcohol abuse 08/05/2024   Nausea and vomiting 08/05/2024   Alcoholic hepatitis (HCC) 08/05/2024   Hypokalemia 11/18/2023   Hepatitis C 11/18/2023   Acute metabolic encephalopathy 11/17/2023   Polysubstance abuse (HCC) 11/17/2023   Multifocal pneumonia 11/17/2023   Acute respiratory failure with hypoxia (HCC) 11/16/2023   Severe sepsis (HCC) 11/16/2023   Transaminitis 11/16/2023    Sinusitis 11/16/2023   PCP:  Freddrick No Pharmacy:   [facility] [address] [place] - [address] [address] Phone: [phone] Fax: [phone]  [facility] - [place], [place] - [address] [place] [address] Phone: [phone] Fax: [phone]  D[facility] LONG - [facility] [address] [place] [address] Phone: [phone] Fax: [phone]     Social Drivers of Health (SDOH) Social History: SDOH Screenings   Food Insecurity: No Food Insecurity (08/05/2024)  Housing: High Risk (08/05/2024)  Transportation Needs: No Transportation Needs (08/05/2024)  Utilities: At Risk (08/05/2024)  Tobacco Use: High Risk (11/29/2024)   SDOH Interventions:     Readmission Risk Interventions     No data to display

## 2024-11-30 NOTE — ED Notes (Signed)
 Pt accepted to [facility] rm 300-2 per [facility], coordinator. Accepting Dr. Raliegh. Sheriff's office to transport at 7:00PM

## 2024-11-30 NOTE — ED Provider Notes (Signed)
----------------------------------------- °  5:31 PM on 11/30/2024 ----------------------------------------- Patient excepted to [facility] behavioral health.  We will arrange transport.   Dorothyann Drivers, MD 11/30/24 [phone]

## 2024-12-01 ENCOUNTER — Encounter (HOSPITAL_COMMUNITY): Payer: Self-pay

## 2024-12-01 DIAGNOSIS — F17200 Nicotine dependence, unspecified, uncomplicated: Secondary | ICD-10-CM | POA: Insufficient documentation

## 2024-12-01 DIAGNOSIS — F411 Generalized anxiety disorder: Secondary | ICD-10-CM | POA: Insufficient documentation

## 2024-12-01 DIAGNOSIS — F79 Unspecified intellectual disabilities: Secondary | ICD-10-CM

## 2024-12-01 DIAGNOSIS — R480 Dyslexia and alexia: Secondary | ICD-10-CM | POA: Insufficient documentation

## 2024-12-01 LAB — RESP PANEL BY RT-PCR (RSV, FLU A&B, COVID)  RVPGX2
Influenza A by PCR: POSITIVE — AB
Influenza B by PCR: NEGATIVE
Resp Syncytial Virus by PCR: NEGATIVE
SARS Coronavirus 2 by RT PCR: NEGATIVE

## 2024-12-01 MED ORDER — NALTREXONE HCL 50 MG PO TABS
25.0000 mg | ORAL_TABLET | Freq: Every day | ORAL | Status: AC
  Administered 2024-12-01: 25 mg via ORAL
  Filled 2024-12-01: qty 1

## 2024-12-01 MED ORDER — INFLUENZA VIRUS VACC SPLIT PF (FLUZONE) 0.5 ML IM SUSY
0.5000 mL | PREFILLED_SYRINGE | INTRAMUSCULAR | Status: DC
  Filled 2024-12-01: qty 0.5

## 2024-12-01 MED ORDER — IBUPROFEN 400 MG PO TABS
400.0000 mg | ORAL_TABLET | Freq: Four times a day (QID) | ORAL | Status: DC | PRN
  Administered 2024-12-01 – 2024-12-03 (×4): 400 mg via ORAL
  Filled 2024-12-01 (×4): qty 1

## 2024-12-01 MED ORDER — MELATONIN 5 MG PO TABS
5.0000 mg | ORAL_TABLET | Freq: Every day | ORAL | Status: DC
  Administered 2024-12-01 – 2024-12-04 (×4): 5 mg via ORAL
  Filled 2024-12-01 (×4): qty 1

## 2024-12-01 MED ORDER — PROPRANOLOL HCL 10 MG PO TABS
10.0000 mg | ORAL_TABLET | Freq: Two times a day (BID) | ORAL | Status: DC
  Administered 2024-12-01 – 2024-12-05 (×9): 10 mg via ORAL
  Filled 2024-12-01 (×9): qty 1

## 2024-12-01 MED ORDER — NALTREXONE HCL 50 MG PO TABS
50.0000 mg | ORAL_TABLET | Freq: Every day | ORAL | Status: DC
  Administered 2024-12-02: 50 mg via ORAL
  Filled 2024-12-01: qty 1

## 2024-12-01 MED ORDER — GUAIFENESIN 100 MG/5ML PO LIQD
5.0000 mL | ORAL | Status: DC | PRN
  Administered 2024-12-01 – 2024-12-02 (×3): 5 mL via ORAL
  Filled 2024-12-01 (×3): qty 10

## 2024-12-01 MED ORDER — ACETAMINOPHEN 325 MG PO TABS: 650.0000 mg | ORAL_TABLET | Freq: Four times a day (QID) | ORAL | Status: DC | PRN

## 2024-12-01 NOTE — BHH Suicide Risk Assessment (Signed)
" °  [employer] Admission Suicide Risk Assessment   Nursing information obtained from:  Patient Demographic factors:  Male, Caucasian, Unemployed Current Mental Status:  NA Loss Factors:  NA Historical Factors:  Impulsivity Risk Reduction Factors:  Positive social support, Living with another person, especially a relative  Total Time spent with patient:: 1 Hour Principal Problem: Alcohol use disorder, severe, in controlled environment (HCC) Diagnosis:  Principal Problem:   Alcohol use disorder, severe, in controlled environment (HCC) Active Problems:   Transaminitis   Hepatitis C   Intentional self-harm (HCC)   Generalized anxiety disorder   Disorder of intellectual development   Dyslexia   Nicotine  dependence   Subjective Data: see H&P  Continued Clinical Symptoms:  Alcohol Use Disorder Identification Test Final Score (AUDIT): 34 The Alcohol Use Disorders Identification Test, Guidelines for Use in Primary Care, Second Edition.  World Science Writer [facility]). Score between 0-7:  no or low risk or alcohol related problems. Score between 8-15:  moderate risk of alcohol related problems. Score between 16-19:  high risk of alcohol related problems. Score 20 or above:  warrants further diagnostic evaluation for alcohol dependence and treatment.   CLINICAL FACTORS:   Severe Anxiety and/or Agitation Alcohol/Substance Abuse/Dependencies More than one psychiatric diagnosis   Musculoskeletal: Strength & Muscle Tone: within normal limits Gait & Station: normal Patient leans: N/A  Psychiatric Specialty Exam: See H&P       COGNITIVE FEATURES THAT CONTRIBUTE TO RISK:  Closed-mindedness    SUICIDE RISK:   Mild:  Suicidal ideation of limited frequency, intensity, duration, and specificity.  There are no identifiable plans, no associated intent, mild dysphoria and related symptoms, good self-control (both objective and subjective assessment), few other risk factors, and  identifiable protective factors, including available and accessible social support.  PLAN OF CARE: see H&P  I certify that inpatient services furnished can reasonably be expected to improve the patient's condition.   Prentice Espy, MD 12/01/2024, 1:11 PM "

## 2024-12-01 NOTE — Progress Notes (Addendum)
 Patient is alert, oriented, pleasant, and cooperative. Denies SI, HI, AVH, and verbally contracts for safety. Patient reports he slept fair last night without sleeping medication. Patient reports his appetite as fair, energy level as low, and concentration as good. Patient rates his depression 5/10, hopelessness 5/10, and anxiety 0/10. Patient reports headache. Patient blood pressure elevated, MD aware.   0945 CIWA 10, 1mg  ativan  given 1530 CIWA 2, no ativan  given   Scheduled medications administered per MD order. PRN tylenol  administered. Support provided. Patient educated on safety on the unit and medications. Routine safety checks every 15 minutes. Patient stated understanding to tell nurse about any new physical symptoms. Patient understands to tell staff of any needs.     No adverse drug reactions noted. Patient remains safe at this time and will continue to monitor.    12/01/24 1000  Psych Admission Type (Psych Patients Only)  Admission Status Involuntary  Psychosocial Assessment  Patient Complaints Anxiety;Depression  Eye Contact Brief  Facial Expression Anxious  Affect Anxious  Speech Logical/coherent  Interaction Minimal  Motor Activity Slow  Appearance/Hygiene Unremarkable  Behavior Characteristics Appropriate to situation;Cooperative;Calm;Anxious  Mood Depressed;Anxious;Pleasant  Thought Process  Coherency WDL  Content WDL  Delusions None reported or observed  Perception WDL  Hallucination None reported or observed  Judgment Impaired  Confusion None  Danger to Self  Current suicidal ideation? Denies  Danger to Others  Danger to Others None reported or observed

## 2024-12-01 NOTE — Plan of Care (Signed)
   Problem: Education: Goal: Knowledge of [facility] Education information/materials will improve Outcome: Progressing Goal: Verbalization of understanding the information provided will improve Outcome: Progressing

## 2024-12-01 NOTE — Plan of Care (Signed)

## 2024-12-01 NOTE — Progress Notes (Signed)
 Pt has cough, when VS checked fever of 102.9 noted.  NP messaged.  12/01/24 2118  Psych Admission Type (Psych Patients Only)  Admission Status Involuntary  Psychosocial Assessment  Patient Complaints Anxiety  Eye Contact Brief  Facial Expression Anxious  Affect Appropriate to circumstance  Speech Logical/coherent  Interaction Minimal  Motor Activity Other (Comment) (WDL)  Appearance/Hygiene Unremarkable  Behavior Characteristics Appropriate to situation  Mood Anxious;Pleasant  Thought Process  Coherency WDL  Content WDL  Delusions None reported or observed  Perception WDL  Hallucination None reported or observed  Judgment Impaired  Confusion None  Danger to Self  Current suicidal ideation? Denies  Danger to Others  Danger to Others None reported or observed

## 2024-12-01 NOTE — Group Note (Deleted)
 Date:  12/01/2024 Time:  8:44 PM  Group Topic/Focus:  Self Esteem Action Plan:   The focus of this group is to help patients create a plan to continue to build self-esteem after discharge.     Participation Level:  {BHH PARTICIPATION OZCZO:77735}  Participation Quality:  {BHH PARTICIPATION QUALITY:22265}  Affect:  {BHH AFFECT:22266}  Cognitive:  {BHH COGNITIVE:22267}  Insight: {BHH Insight2:20797}  Engagement in Group:  {BHH ENGAGEMENT IN HMNLE:77731}  Modes of Intervention:  {BHH MODES OF INTERVENTION:22269}  Additional Comments:  ***  Lawrence Griffith 12/01/2024, 8:44 PM

## 2024-12-01 NOTE — Group Note (Signed)
 Date:  12/01/2024 Time:  5:19 PM  Group Topic/Focus:  Dimensions of Wellness:   The focus of this group is to introduce the topic of wellness using collage. Patients created intuitive collages: serving as a creative outlet for expressing emotions, reducing anxiety, fostering group cohesion and enabling personal insight and healing.     Participation Level:  Did Not Attend   Lawrence Griffith 12/01/2024, 5:19 PM

## 2024-12-01 NOTE — Progress Notes (Signed)
 Patient is a 28 year old male involuntarily admitted for suicide attempt. Patient reports that he drank 15 [employer] beers within 2 hours and reports that he had been thinking about different things that have been stressing him out in life and put a knife to his throat to kill himself. He reports he drinks daily and has been drinking since a young age. Patient currently denies SI,HI,auditory and visual hallucinations. Patient oriented to unit and safety maintained with 15 min checks.

## 2024-12-01 NOTE — BHH Group Notes (Signed)
 Pt did not attend the CSW group today, 12/01/24 (1000-1100)

## 2024-12-01 NOTE — Group Note (Signed)
 Date:  12/01/2024 Time:  10:13 AM  Group Topic/Focus: Goals group Patients began by introducing themselves and sharing their favorite food as an research scientist (life sciences) activity. After that, they were given SMART goal worksheets to complete. Examples of SMART goals were provided, and the patients had time to fill out their worksheets. Once finished, each patient shared their icebreaker responses and SMART goals with the group, encouraging social interaction and goal-setting.   Participation Level:  Did Not Attend  Participation Quality:  N/A  Affect:  N/A  Cognitive:  N/A  Insight: None  Engagement in Group:  None  Modes of Intervention:  N/A  Additional Comments:  Pt did not attend goals group  Kristi HERO [facility] 12/01/2024, 10:13 AM

## 2024-12-01 NOTE — Group Note (Signed)
 Date:  12/01/2024 Time:  8:44 PM  Group Topic/Focus:  Wrap-Up Group:   The focus of this group is to help patients review their daily goal of treatment and discuss progress on daily workbooks.    Participation Level:  Did Not Attend  Participation Quality:  Did not attend  Affect:  Did not attend  Cognitive:  Did not attend  Insight: None  Engagement in Group:  None  Modes of Intervention:  Did not attend  Additional Comments:  Pt did not attend wrap-up group.  Ameirah Khatoon Claudene 12/01/2024, 8:44 PM
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
## 2024-12-01 NOTE — Group Note (Deleted)
 Date:  12/01/2024 Time:  4:24 PM  Group Topic/Focus: Karaoke and bingo  Patients participated in a 20-minute karaoke session, which provided an opportunity for them to express their creativity, build confidence, and offer support to each other. Following the karaoke, the group spent the remainder of the hour playing bingo with this MHT. The activity fostered engagement and social interaction among the patients. Winners of [employer] game were rewarded with an extra snack as a positive reinforcement.     Participation Level:  {BHH PARTICIPATION OZCZO:77735}  Participation Quality:  {BHH PARTICIPATION QUALITY:22265}  Affect:  {BHH AFFECT:22266}  Cognitive:  {BHH COGNITIVE:22267}  Insight: {BHH Insight2:20797}  Engagement in Group:  {BHH ENGAGEMENT IN HMNLE:77731}  Modes of Intervention:  {BHH MODES OF INTERVENTION:22269}  Additional Comments:  ***  Kristi CHRISTELLA Plaza 12/01/2024, 4:24 PM

## 2024-12-01 NOTE — Group Note (Signed)
 Date:  12/01/2024 Time:  4:16 PM  Group Topic/Focus: Karaoke and bingo  Patients participated in a 20-minute karaoke session, which provided an opportunity for them to express their creativity, build confidence, and offer support to each other. Following the karaoke, the group spent the remainder of the hour playing bingo with this MHT. The activity fostered engagement and social interaction among the patients. Winners of [employer] game were rewarded with an extra snack as a positive reinforcement.    Participation Level:  Did Not Attend  Participation Quality:  N/A  Affect:  N/A  Cognitive:  N/A  Insight: None  Engagement in Group:  None  Modes of Intervention:  N/A  Additional Comments:  Pt did not attend this group  Kristi HERO [facility] 12/01/2024, 4:16 PM

## 2024-12-01 NOTE — Progress Notes (Signed)
(  Sleep Hours) - 7.5  (Any PRNs that were needed, meds refused, or side effects to meds)- None  (Any disturbances and when (visitation, over night)- None  (Concerns raised by the patient)-  none  (SI/HI/AVH)- denies
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
## 2024-12-01 NOTE — Progress Notes (Signed)
 Nasal swab performed, specimen sent to lab.  Pt tolerated well.

## 2024-12-01 NOTE — Progress Notes (Signed)
" °   11/30/24 2317  Psych Admission Type (Psych Patients Only)  Admission Status Involuntary  Psychosocial Assessment  Patient Complaints Depression  Eye Contact Brief  Facial Expression Anxious  Affect Anxious  Speech Logical/coherent;Other (Comment) (pt stutters when nervous)  Interaction Minimal  Motor Activity Slow  Appearance/Hygiene In scrubs  Behavior Characteristics Appropriate to situation  Mood Depressed;Anxious  Thought Process  Coherency Circumstantial  Content Blaming self  Delusions None reported or observed  Perception WDL  Hallucination None reported or observed  Judgment Impaired  Confusion None  Danger to Self  Current suicidal ideation? Denies  Danger to Others  Danger to Others None reported or observed    "

## 2024-12-01 NOTE — Tx Team (Signed)
 Initial Treatment Plan 12/01/2024 6:19 AM Lawrence Griffith. FMW:[id number]    PATIENT STRESSORS: Substance abuse     PATIENT STRENGTHS: Supportive family/friends    PATIENT IDENTIFIED PROBLEMS: SI  Alcohol Substance Abuse                   DISCHARGE CRITERIA:  Adequate post-discharge living arrangements Improved stabilization in mood, thinking, and/or behavior Withdrawal symptoms are absent or subacute and managed without 24-hour nursing intervention  PRELIMINARY DISCHARGE PLAN: Attend 12-step recovery group Outpatient therapy  PATIENT/FAMILY INVOLVEMENT: This treatment plan has been presented to and reviewed with the patient, Lawrence Griffith..  The patient and family have been given the opportunity to ask questions and make suggestions.  NEOMI GATTIS HERO, RN 12/01/2024, 6:19 AM

## 2024-12-01 NOTE — Group Note (Signed)
 Date:  12/01/2024 Time:  11:21 AM  Group Topic/Focus: Social wellness  This group focused on the concept of control--gaining it, losing it, and sharing it. The patients sat in a circle with a blank piece of paper and were instructed to draw something on it. A timer was set for one minute, and when it went off, patients were to pass their papers to the left. They were encouraged to add to the drawing, whether it related to the original theme or not. At the end of the activity, patients shared their drawings, reflected on what was added, and discussed their experiences together. The group then explored how it felt to share control and consider different perspectives. This activity promoted creative thinking and fostered positive social interactions.    Participation Level:  Did Not Attend  Participation Quality:  N/A  Affect:  N/A  Cognitive:  N/A  Insight: None  Engagement in Group:  None  Modes of Intervention:  N/A  Additional Comments:  Pt did not attend this group  Kristi HERO [facility] 12/01/2024, 11:21 AM

## 2024-12-01 NOTE — [facility] Group Notes (Signed)
 LCSW Group Therapy Note  12/01/2024   10:00am-11:00am  Type of Therapy and Topic:  Group Therapy: Gratitude  Participation Level:  Did Not Attend   Description of Group:   In this group, patients shared and discussed the importance of acknowledging the elements in their lives for which they are grateful and how this can positively impact their mood.  The group discussed how bringing the positive elements of their lives to the forefront of their minds can help with recovery from any illness, physical or mental.  An exercise was done as a group in which a list was made of gratitude items in order to encourage participants to consider other potential positives in their lives.  Therapeutic Goals: Patients will discuss quotes about gratitude and explore how a change of attitude can make life more joyful. Patients will identify one or more items for which they are grateful in each of 6 categories:  people, experiences, things, places, skills, and other. Patients will discuss how it is possible to seek out gratitude in even bad situations. Patients will explore how the lack of gratitude can bring them down.   Summary of Patient Progress:  Patient was invited to group, did not attend.   Therapeutic Modalities:   Solution-Focused Therapy Activity  Mishika Flippen J Grossman-Orr, LCSW .

## 2024-12-01 NOTE — Group Note (Signed)
 Date:  12/01/2024 Time:  2:01 PM  Group Topic/Focus: Physical wellness group  This physical wellness group focused on Zumba and music-based movement using YouTube videos. Patients were encouraged to engage in dancing with peers as a fun, accessible way to increase physical activity and promote circulation as an alternative to gym-based exercise.    Participation Level:  Did Not Attend  Participation Quality:  N/A  Affect:  N/A  Cognitive:  N/A  Insight: None  Engagement in Group:  None  Modes of Intervention:  N/A  Additional Comments:  Pt did not attend this group  Kristi HERO [facility] 12/01/2024, 2:01 PM

## 2024-12-02 DIAGNOSIS — F88 Other disorders of psychological development: Secondary | ICD-10-CM

## 2024-12-02 DIAGNOSIS — R4588 Nonsuicidal self-harm: Secondary | ICD-10-CM

## 2024-12-02 DIAGNOSIS — F1020 Alcohol dependence, uncomplicated: Secondary | ICD-10-CM

## 2024-12-02 DIAGNOSIS — B1920 Unspecified viral hepatitis C without hepatic coma: Secondary | ICD-10-CM

## 2024-12-02 DIAGNOSIS — R7401 Elevation of levels of liver transaminase levels: Secondary | ICD-10-CM

## 2024-12-02 DIAGNOSIS — F17200 Nicotine dependence, unspecified, uncomplicated: Secondary | ICD-10-CM

## 2024-12-02 DIAGNOSIS — R480 Dyslexia and alexia: Secondary | ICD-10-CM

## 2024-12-02 DIAGNOSIS — F411 Generalized anxiety disorder: Secondary | ICD-10-CM

## 2024-12-02 LAB — TSH: TSH: 1.3 u[IU]/mL (ref 0.350–4.500)

## 2024-12-02 LAB — HEPATIC FUNCTION PANEL
ALT: 204 U/L — ABNORMAL HIGH (ref 0–44)
AST: 255 U/L — ABNORMAL HIGH (ref 15–41)
Albumin: 4.6 g/dL (ref 3.5–5.0)
Alkaline Phosphatase: 63 U/L (ref 38–126)
Bilirubin, Direct: 0.3 mg/dL — ABNORMAL HIGH (ref 0.0–0.2)
Indirect Bilirubin: 0.3 mg/dL (ref 0.3–0.9)
Total Bilirubin: 0.7 mg/dL (ref 0.0–1.2)
Total Protein: 8.3 g/dL — ABNORMAL HIGH (ref 6.5–8.1)

## 2024-12-02 LAB — VITAMIN D 25 HYDROXY (VIT D DEFICIENCY, FRACTURES): Vit D, 25-Hydroxy: 18.5 ng/mL — ABNORMAL LOW (ref 30–100)

## 2024-12-02 MED ORDER — ONDANSETRON HCL 4 MG PO TABS
8.0000 mg | ORAL_TABLET | Freq: Three times a day (TID) | ORAL | Status: DC | PRN
  Administered 2024-12-02: 8 mg via ORAL
  Filled 2024-12-02: qty 2

## 2024-12-02 MED ORDER — MENTHOL 3 MG MT LOZG: 1.0000 | LOZENGE | OROMUCOSAL | Status: DC | PRN

## 2024-12-02 MED ORDER — ACETAMINOPHEN 325 MG PO TABS
650.0000 mg | ORAL_TABLET | Freq: Four times a day (QID) | ORAL | Status: DC | PRN
  Administered 2024-12-02 – 2024-12-05 (×7): 650 mg via ORAL
  Filled 2024-12-02 (×7): qty 2

## 2024-12-02 MED ORDER — ENSURE PLUS HIGH PROTEIN PO LIQD
237.0000 mL | Freq: Two times a day (BID) | ORAL | Status: DC
  Administered 2024-12-03 – 2024-12-05 (×5): 237 mL via ORAL
  Filled 2024-12-02 (×8): qty 237

## 2024-12-02 MED ORDER — OSELTAMIVIR PHOSPHATE 75 MG PO CAPS
75.0000 mg | ORAL_CAPSULE | Freq: Two times a day (BID) | ORAL | Status: DC
  Administered 2024-12-02 – 2024-12-05 (×7): 75 mg via ORAL
  Filled 2024-12-02 (×7): qty 1

## 2024-12-02 NOTE — [facility] Group Notes (Signed)
 Psychoeducational Group Note  Date:  12/02/2024 Time:  2000  Group Topic/Focus:  Wrap up group  Participation Level: Did Not Attend  Participation Quality:  Not Applicable  Affect:  Not Applicable  Cognitive:  Not Applicable  Insight:  Not Applicable  Engagement in Group: Not Applicable  Additional Comments:  Did  not attend.   Lenora Manuelita RAMAN 12/02/2024, 8:56 PM

## 2024-12-02 NOTE — Progress Notes (Addendum)
 Pt denies SI/HI/AVH this morning. Pt complains of alcohol detox symtpoms/ Influenza A symptoms including chills, body aches, headache, nausea and vomiting, and productive cough. PRN Zofran , Ibuprofen , and robitussin given for symptoms. Droplet precautions maintained throughout shift. Q 15 minute safety checks remain in place for safety,   12/02/24 0906  Psych Admission Type (Psych Patients Only)  Admission Status Involuntary  Psychosocial Assessment  Patient Complaints Other (Comment);Substance abuse (I do not feel well)  Eye Contact Brief  Facial Expression Anxious  Affect Anxious;Sad  Speech Logical/coherent  Interaction Isolative  Motor Activity Slow  Appearance/Hygiene Disheveled  Behavior Characteristics Cooperative;Anxious  Mood Anxious;Sad  Thought Process  Coherency WDL  Content WDL  Delusions None reported or observed  Perception WDL  Hallucination None reported or observed  Judgment Impaired  Confusion None  Danger to Self  Current suicidal ideation? Denies  Danger to Others  Danger to Others None reported or observed

## 2024-12-02 NOTE — Group Note (Signed)
 Date:  12/02/2024 Time:  10:43 AM  Group Topic/Focus:  Goals Group:   The focus of this group is to help patients establish daily goals to achieve during treatment and discuss how the patient can incorporate goal setting into their daily lives to aide in recovery.    Participation Level:  Did Not Attend   Lawrence Griffith 12/02/2024, 10:43 AM

## 2024-12-02 NOTE — Group Note (Signed)
 Date:  12/02/2024 Time:  7:42 PM  Group Topic/Focus: The affect illicit drugs have on dopamine and alternative means of getting dopamine hits safely. Making Healthy Choices:   The focus of this group is to help patients identify negative/unhealthy choices they were using prior to admission and identify positive/healthier coping strategies to replace them upon discharge.    Participation Level:  Did Not Attend    Juliene CHRISTELLA Huddle 12/02/2024, 7:42 PM

## 2024-12-02 NOTE — Plan of Care (Signed)
   Problem: Education: Goal: Knowledge of [facility] Education information/materials will improve Outcome: Progressing   Problem: Education: Goal: Verbalization of understanding the information provided will improve Outcome: Progressing

## 2024-12-02 NOTE — Progress Notes (Signed)
 [facility] MD Progress Note  12/02/2024 11:32 AM Lawrence Griffith.  MRN:  [id number]  Principal Problem: Alcohol use disorder, severe, in controlled environment [facility]) Diagnosis: Principal Problem:   Alcohol use disorder, severe, in controlled environment (HCC) Active Problems:   Transaminitis   Hepatitis C   Intentional self-harm (HCC)   Generalized anxiety disorder   Disorder of intellectual development   Dyslexia   Nicotine  dependence   Reason for Admission:  Lawrence Griffith. is a 28 y.o., male with history of intellectual developmental disorder, dyslexia, alcohol use disorder complicated by alcohol withdrawal seizures presents to [facility] from [facility] ED due to putting knife to neck in a suicidal gesture in the context of severe alcoholic intoxication. He has never been psychiatrically hospitalized but has been to ED on several occasions due to alcohol intoxication.  Ethanol level 397 while in ED  (admitted on 11/30/2024, total  LOS: 2 days )   ASSESSMENT: Patient tested positive for flu. Alcohol withdrawal symptoms currently fair but did have significant nausea/vomiting which may have been secondary to persistent coughing. Will continue CIWA protocol and added some PRNs for cough as well as Tamiflu  as symptom onset was 12/20. Increasing naltrexone  to 50 mg at bedtime for alcohol use disorder.    PLAN: Safety and Monitoring:             -- Involuntary admission to inpatient psychiatric unit for safety, stabilization and treatment             -- Daily contact with patient to assess and evaluate symptoms and progress in treatment             -- Patient's case to be discussed in multi-disciplinary team meeting             -- Observation Level : q15 minute checks             -- Vital signs: q12 hours             -- Precautions: suicide, elopement, and assault   2. Psychiatric Problems Alcohol Use Disorder-severe, dependence Generalized Anxiety Disorder Intellectual Developmental  Disorder Dyslexia -Increase to naltrexone  50 mg -continue propranolol  10 mg bid -CIWA w/ ativan  -MVI/Thiamine  -TSH, vitamin D , hepatic function panel ordered -ensure for nutritional supplementation   -PRNs: maalox, milk of magnesia, hydroxyzine, trazodone , ibuprofen  -- As needed agitation protocol in-place   The risks/benefits/side-effects/alternatives to the above medication were discussed in detail with the patient and time was given for questions. The patient consents to medication trial. FDA black box warnings, if present, were discussed.   The patient is agreeable with the medication plan, as above. We will monitor the patient's response to pharmacologic treatment, and adjust medications as necessary.   3. Medical Problems #Influenza A -tamiflu  bid x5 days -menthol  lozenge 3 mg prn -Robitussin 100 mg q4h prn -tylenol  650 mg q6h prn for fever   Hepatitis C -unclear if has been taking epclusa   -rechecking LFTs  Disposition Planning: -- Estimated LOS: 5 days --Estimated Discharge Date 12/05/24 --Barriers to Discharge: alcohol withdrawal -- Discharge Goals: Return home with outpatient referrals for mental health follow-up including medication management/psychotherapy  Subjective: The patient was seen and evaluated on the unit. On assessment today the patient reports alcohol withdrawal symptoms are well controlled with current medications. Denies SI/HI/AVH. Eating somewhat poorly due to nausea/vomiting. Understands he needs to continue fluid hydration.    Objective: Chart Review from last 24 hours:  The patient's chart was reviewed and nursing  notes were reviewed. The patient's case was discussed in multidisciplinary team meeting.  - Overnight events to report per chart review / staff report: tested positive for flu, stopped eating - Patient took all prescribed medications yes - Patient received the following PRNs: robitussin, advil   Current Medications: Current  Facility-Administered Medications  Medication Dose Route Frequency Provider Last Rate Last Admin   acetaminophen  (TYLENOL ) tablet 650 mg  650 mg Oral Q6H PRN Lynnette Barter, MD       alum & mag hydroxide-simeth (MAALOX/MYLANTA) 200-200-20 MG/5ML suspension 30 mL  30 mL Oral Q4H PRN Smith, Annie B, NP       haloperidol  (HALDOL ) tablet 5 mg  5 mg Oral TID PRN Smith, Annie B, NP       And   diphenhydrAMINE  (BENADRYL ) capsule 50 mg  50 mg Oral TID PRN Smith, Annie B, NP       haloperidol  lactate (HALDOL ) injection 10 mg  10 mg Intramuscular TID PRN Smith, Annie B, NP       And   diphenhydrAMINE  (BENADRYL ) injection 50 mg  50 mg Intramuscular TID PRN Smith, Annie B, NP       And   LORazepam  (ATIVAN ) injection 2 mg  2 mg Intramuscular TID PRN Smith, Annie B, NP       folic acid  (FOLVITE ) tablet 1 mg  1 mg Oral Daily Smith, Annie B, NP   1 mg at 12/02/24 0824   guaiFENesin  (ROBITUSSIN) 100 MG/5ML liquid 5 mL  5 mL Oral Q4H PRN Bobbitt, Shalon E, NP   5 mL at 12/02/24 0947   ibuprofen  (ADVIL ) tablet 400 mg  400 mg Oral Q6H PRN Lynnette Barter, MD   400 mg at 12/02/24 0825   influenza vac split trivalent PF (FLUZONE ) injection 0.5 mL  0.5 mL Intramuscular Tomorrow-1000 Pashayan, Alexander S, DO       LORazepam  (ATIVAN ) tablet 0-4 mg  0-4 mg Oral Q6H Smith, Annie B, NP   1 mg at 12/02/24 9053   Followed by   LORazepam  (ATIVAN ) tablet 0-4 mg  0-4 mg Oral Q12H Smith, Annie B, NP       magnesium  hydroxide (MILK OF MAGNESIA) suspension 30 mL  30 mL Oral Daily PRN Smith, Annie B, NP       melatonin tablet 5 mg  5 mg Oral QHS Lynnette Barter, MD   5 mg at 12/01/24 2055   menthol  (CEPACOL) lozenge 3 mg  1 lozenge Oral PRN Lynnette Barter, MD       multivitamin with minerals tablet 1 tablet  1 tablet Oral Daily Smith, Annie B, NP   1 tablet at 12/02/24 0825   naltrexone  (DEPADE) tablet 50 mg  50 mg Oral QHS Winfield Caba, MD       nicotine  (NICODERM CQ  - dosed in mg/24 hours) patch 14 mg  14 mg Transdermal Daily Pashayan, Alexander  S, DO   14 mg at 12/02/24 0825   ondansetron  (ZOFRAN ) tablet 8 mg  8 mg Oral Q8H PRN Lynnette Barter, MD   8 mg at 12/02/24 0825   oseltamivir  (TAMIFLU ) capsule 75 mg  75 mg Oral Q12H Mckensie Scotti, MD   75 mg at 12/02/24 1057   propranolol  (INDERAL ) tablet 10 mg  10 mg Oral BID Lynnette Barter, MD   10 mg at 12/02/24 0825   thiamine  (Vitamin B-1) tablet 100 mg  100 mg Oral Daily Smith, Annie B, NP   100 mg at 12/02/24 9175    Lab Results:  Results for orders placed or performed during the hospital encounter of 11/30/24 (from the past 48 hours)  Resp panel by RT-PCR (RSV, Flu A&B, Covid) Anterior Nasal Swab     Status: Abnormal   Collection Time: 12/01/24  9:39 PM   Specimen: Anterior Nasal Swab  Result Value Ref Range   SARS Coronavirus 2 by RT PCR NEGATIVE NEGATIVE    Comment: (NOTE) SARS-CoV-2 target nucleic acids are NOT DETECTED.  The SARS-CoV-2 RNA is generally detectable in upper respiratory specimens during the acute phase of infection. The lowest concentration of SARS-CoV-2 viral copies this assay can detect is 138 copies/mL. A negative result does not preclude SARS-Cov-2 infection and should not be used as the sole basis for treatment or other patient management decisions. A negative result may occur with  improper specimen collection/handling, submission of specimen other than nasopharyngeal swab, presence of viral mutation(s) within the areas targeted by this assay, and inadequate number of viral copies(<138 copies/mL). A negative result must be combined with clinical observations, patient history, and epidemiological information. The expected result is Negative.  Fact Sheet for Patients:  bloggercourse.com  Fact Sheet for Healthcare Providers:  seriousbroker.it  This test is no t yet approved or cleared by the [place]  FDA and  has been authorized for detection and/or diagnosis of SARS-CoV-2 by FDA under an Emergency Use  Authorization (EUA). This EUA will remain  in effect (meaning this test can be used) for the duration of the COVID-19 declaration under Section 564(b)(1) of the Act, 21 U.S.C.section 360bbb-3(b)(1), unless the authorization is terminated  or revoked sooner.       Influenza A by PCR POSITIVE (A) NEGATIVE   Influenza B by PCR NEGATIVE NEGATIVE    Comment: (NOTE) The Xpert Xpress SARS-CoV-2/FLU/RSV plus assay is intended as an aid in the diagnosis of influenza from Nasopharyngeal swab specimens and should not be used as a sole basis for treatment. Nasal washings and aspirates are unacceptable for Xpert Xpress SARS-CoV-2/FLU/RSV testing.  Fact Sheet for Patients: bloggercourse.com  Fact Sheet for Healthcare Providers: seriousbroker.it  This test is not yet approved or cleared by the [place]  FDA and has been authorized for detection and/or diagnosis of SARS-CoV-2 by FDA under an Emergency Use Authorization (EUA). This EUA will remain in effect (meaning this test can be used) for the duration of the COVID-19 declaration under Section 564(b)(1) of the Act, 21 U.S.C. section 360bbb-3(b)(1), unless the authorization is terminated or revoked.     Resp Syncytial Virus by PCR NEGATIVE NEGATIVE    Comment: (NOTE) Fact Sheet for Patients: bloggercourse.com  Fact Sheet for Healthcare Providers: seriousbroker.it  This test is not yet approved or cleared by the [place]  FDA and has been authorized for detection and/or diagnosis of SARS-CoV-2 by FDA under an Emergency Use Authorization (EUA). This EUA will remain in effect (meaning this test can be used) for the duration of the COVID-19 declaration under Section 564(b)(1) of the Act, 21 U.S.C. section 360bbb-3(b)(1), unless the authorization is terminated or revoked.  Performed at [facility], [address], [place], [place] [address]     Blood Alcohol level:  Lab Results  Component Value Date   ETH 397 [facility]) 11/29/2024   ETH >450 (HH) 11/13/2024    Metabolic Labs: No results found for: HGBA1C, MPG No results found for: PROLACTIN No results found for: CHOL, TRIG, HDL, CHOLHDL, VLDL, LDLCALC  Physical Findings:   CIWA:  CIWA-Ar Total: 7  Psychiatric Specialty Exam: General  Appearance: Caucasian male appear stated age, appropriately dressed  Eye Contact: minimal  Speech: clear, occasionally stutters  Volume: appropriate  Mood: ok  Affect: constricted  Thought Content: preoccupied with flu and hospitalization  Suicidal Thoughts: denies  Homicidal Thoughts: denies  Thought Process: coherent, goal directed  Orientation: axox3    Memory: fair  Judgment: poor  Insight: poor  Concentration: fair  Recall: fair  Fund of Knowledge: fair  Language: fair  Psychomotor Activity: decreased  Assets: resilience  Sleep: poor   Review of Systems ROS  Vital Signs: Blood pressure 137/79, pulse 97, temperature (!) 101.7 F (38.7 C), temperature source Oral, resp. rate 18, height 5' 8 (1.727 m), weight 83.5 kg, SpO2 98%. Body mass index is 27.98 kg/m. Physical Exam  I certify that inpatient services furnished can reasonably be expected to improve the patient's condition.   Signed: Prentice Espy, MD 12/02/2024, 11:32 AM

## 2024-12-02 NOTE — Progress Notes (Addendum)
(  Sleep Hours) - 5.75 hours (Any PRNs that were needed, meds refused, or side effects to meds)- guaifenesin  given (Any disturbances and when (visitation, over night)- frequent coughing (Concerns raised by the patient)-  Pt has tested positive for influenza A (SI/HI/AVH)-  Denies

## 2024-12-02 NOTE — Progress Notes (Signed)
" °   12/02/24 2054  Psych Admission Type (Psych Patients Only)  Admission Status Involuntary  Psychosocial Assessment  Patient Complaints Anxiety  Eye Contact Brief  Facial Expression Anxious  Affect Appropriate to circumstance  Speech Logical/coherent  Interaction Minimal  Motor Activity Slow  Appearance/Hygiene Disheveled  Behavior Characteristics Cooperative;Appropriate to situation  Mood Anxious  Thought Process  Coherency WDL  Content WDL  Delusions None reported or observed  Perception WDL  Hallucination None reported or observed  Judgment Impaired  Confusion None  Danger to Self  Current suicidal ideation? Denies  Danger to Others  Danger to Others None reported or observed    "

## 2024-12-02 NOTE — Plan of Care (Signed)
   Problem: Education: Goal: Knowledge of [facility] Education information/materials will improve Outcome: Progressing Goal: Emotional status will improve Outcome: Progressing Goal: Mental status will improve Outcome: Progressing Goal: Verbalization of understanding the information provided will improve Outcome: Progressing

## 2024-12-02 NOTE — Progress Notes (Signed)
 Pt resulted positive for Influenza A.  Droplet precautions initiated.

## 2024-12-03 ENCOUNTER — Encounter (HOSPITAL_COMMUNITY): Payer: Self-pay

## 2024-12-03 MED ORDER — VITAMIN D 25 MCG (1000 UNIT) PO TABS
2000.0000 [IU] | ORAL_TABLET | Freq: Every day | ORAL | Status: DC
  Administered 2024-12-03 – 2024-12-05 (×3): 2000 [IU] via ORAL
  Filled 2024-12-03 (×3): qty 2

## 2024-12-03 MED ORDER — NALTREXONE HCL 50 MG PO TABS
25.0000 mg | ORAL_TABLET | Freq: Every day | ORAL | Status: DC
  Administered 2024-12-03 – 2024-12-04 (×2): 25 mg via ORAL
  Filled 2024-12-03 (×2): qty 1

## 2024-12-03 NOTE — Progress Notes (Signed)
" °   12/03/24 1100  Psych Admission Type (Psych Patients Only)  Admission Status Involuntary  Psychosocial Assessment  Patient Complaints Other (Comment) (headache)  Eye Contact Brief  Facial Expression Anxious  Affect Appropriate to circumstance  Speech Logical/coherent  Interaction Minimal  Motor Activity Slow  Appearance/Hygiene Disheveled  Behavior Characteristics Cooperative;Appropriate to situation;Calm  Mood Pleasant  Thought Process  Coherency WDL  Content WDL  Delusions None reported or observed  Perception WDL  Hallucination None reported or observed  Judgment Impaired  Confusion None  Danger to Self  Current suicidal ideation? Denies  Danger to Others  Danger to Others None reported or observed    "

## 2024-12-03 NOTE — [facility] Group Notes (Signed)
 Patient did not attend the Physical Wellness group.

## 2024-12-03 NOTE — BH IP Treatment Plan (Signed)
 Interdisciplinary Treatment and Diagnostic Plan Update  12/03/2024 Time of Session: 10:00 AM Lawrence Griffith. MRN: [id number]  Principal Diagnosis: Alcohol use disorder, severe, in controlled environment [name])  Secondary Diagnoses: Principal Problem:   Alcohol use disorder, severe, in controlled environment [name]) Active Problems:   Transaminitis   Hepatitis C   Intentional self-harm (HCC)   Generalized anxiety disorder   Disorder of intellectual development   Dyslexia   Nicotine  dependence   Current Medications:  Current Facility-Administered Medications  Medication Dose Route Frequency Provider Last Rate Last Admin   acetaminophen  (TYLENOL ) tablet 650 mg  650 mg Oral Q6H PRN Lynnette Barter, MD   650 mg at 12/03/24 0849   alum & mag hydroxide-simeth (MAALOX/MYLANTA) 200-200-20 MG/5ML suspension 30 mL  30 mL Oral Q4H PRN Smith, Annie B, NP       cholecalciferol  (VITAMIN D3) 25 MCG (1000 UNIT) tablet 2,000 Units  2,000 Units Oral Daily Carrion-Carrero, Marlo, MD   2,000 Units at 12/03/24 1100   haloperidol  (HALDOL ) tablet 5 mg  5 mg Oral TID PRN Smith, Annie B, NP       And   diphenhydrAMINE  (BENADRYL ) capsule 50 mg  50 mg Oral TID PRN Smith, Annie B, NP       haloperidol  lactate (HALDOL ) injection 10 mg  10 mg Intramuscular TID PRN Smith, Annie B, NP       And   diphenhydrAMINE  (BENADRYL ) injection 50 mg  50 mg Intramuscular TID PRN Smith, Annie B, NP       And   LORazepam  (ATIVAN ) injection 2 mg  2 mg Intramuscular TID PRN Smith, Annie B, NP       feeding supplement (ENSURE PLUS HIGH PROTEIN) liquid 237 mL  237 mL Oral BID BM Ji, Andrew, MD   237 mL at 12/03/24 1459   folic acid  (FOLVITE ) tablet 1 mg  1 mg Oral Daily Smith, Annie B, NP   1 mg at 12/03/24 0847   guaiFENesin  (ROBITUSSIN) 100 MG/5ML liquid 5 mL  5 mL Oral Q4H PRN Bobbitt, Shalon E, NP   5 mL at 12/02/24 2058   ibuprofen  (ADVIL ) tablet 400 mg  400 mg Oral Q6H PRN Lynnette Barter, MD   400 mg at 12/03/24 1619    influenza vac split trivalent PF (FLUZONE ) injection 0.5 mL  0.5 mL Intramuscular Tomorrow-1000 Pashayan, Alexander S, DO       LORazepam  (ATIVAN ) tablet 0-4 mg  0-4 mg Oral Q12H Smith, Annie B, NP   1 mg at 12/02/24 1645   magnesium  hydroxide (MILK OF MAGNESIA) suspension 30 mL  30 mL Oral Daily PRN Smith, Annie B, NP       melatonin tablet 5 mg  5 mg Oral QHS Lynnette Barter, MD   5 mg at 12/02/24 2058   menthol  (CEPACOL) lozenge 3 mg  1 lozenge Oral PRN Lynnette Barter, MD       multivitamin with minerals tablet 1 tablet  1 tablet Oral Daily Smith, Annie B, NP   1 tablet at 12/03/24 9152   naltrexone  (DEPADE) tablet 25 mg  25 mg Oral QHS Carrion-Carrero, Margely, MD       nicotine  (NICODERM CQ  - dosed in mg/24 hours) patch 14 mg  14 mg Transdermal Daily Pashayan, Alexander S, DO   14 mg at 12/03/24 0847   ondansetron  (ZOFRAN ) tablet 8 mg  8 mg Oral Q8H PRN Lynnette Barter, MD   8 mg at 12/02/24 0825   oseltamivir  (TAMIFLU ) capsule 75 mg  75 mg Oral Q12H Ji, Andrew, MD   75 mg at 12/03/24 9152   propranolol  (INDERAL ) tablet 10 mg  10 mg Oral BID Ji, Andrew, MD   10 mg at 12/03/24 1619   thiamine  (Vitamin B-1) tablet 100 mg  100 mg Oral Daily Smith, Annie B, NP   100 mg at 12/03/24 9153   PTA Medications: Medications Prior to Admission  Medication Sig Dispense Refill Last Dose/Taking   ibuprofen  (ADVIL ) 800 MG tablet Take 1 tablet (800 mg total) by mouth every 8 (eight) hours as needed. 20 tablet 0 Past Week   meloxicam  (MOBIC ) 15 MG tablet Take 15 mg by mouth daily. Last filled October 04, 2024 at [facility]   Past Month   ondansetron  (ZOFRAN -ODT) 4 MG disintegrating tablet Take 4 mg by mouth every 8 (eight) hours as needed for nausea or vomiting. Rx sent in August 3rd, 2025, but not picked up by patient   Past Month   Sofosbuvir -Velpatasvir  (EPCLUSA ) 400-100 MG TABS Take 1 tablet by mouth daily. 28 tablet 2 Past Week    Patient Stressors: Substance abuse    Patient Strengths: Supportive family/friends    Treatment Modalities: Medication Management, Group therapy, Case management,  1 to 1 session with clinician, Psychoeducation, Recreational therapy.   Physician Treatment Plan for Primary Diagnosis: Alcohol use disorder, severe, in controlled environment (HCC) Long Term Goal(s):     Short Term Goals:    Medication Management: Evaluate patient's response, side effects, and tolerance of medication regimen.  Therapeutic Interventions: 1 to 1 sessions, Unit Group sessions and Medication administration.  Evaluation of Outcomes: Not Progressing  Physician Treatment Plan for Secondary Diagnosis: Principal Problem:   Alcohol use disorder, severe, in controlled environment (HCC) Active Problems:   Transaminitis   Hepatitis C   Intentional self-harm (HCC)   Generalized anxiety disorder   Disorder of intellectual development   Dyslexia   Nicotine  dependence  Long Term Goal(s):     Short Term Goals:       Medication Management: Evaluate patient's response, side effects, and tolerance of medication regimen.  Therapeutic Interventions: 1 to 1 sessions, Unit Group sessions and Medication administration.  Evaluation of Outcomes: Not Progressing   RN Treatment Plan for Primary Diagnosis: Alcohol use disorder, severe, in controlled environment (HCC) Long Term Goal(s): Knowledge of disease and therapeutic regimen to maintain health will improve  Short Term Goals: Ability to remain free from injury will improve, Ability to verbalize frustration and anger appropriately will improve, Ability to demonstrate self-control, Ability to participate in decision making will improve, Ability to verbalize feelings will improve, Ability to disclose and discuss suicidal ideas, Ability to identify and develop effective coping behaviors will improve, and Compliance with prescribed medications will improve  Medication Management: RN will administer medications as ordered by provider, will assess and evaluate  patient's response and provide education to patient for prescribed medication. RN will report any adverse and/or side effects to prescribing provider.  Therapeutic Interventions: 1 on 1 counseling sessions, Psychoeducation, Medication administration, Evaluate responses to treatment, Monitor vital signs and CBGs as ordered, Perform/monitor CIWA, COWS, AIMS and Fall Risk screenings as ordered, Perform wound care treatments as ordered.  Evaluation of Outcomes: Not Progressing   LCSW Treatment Plan for Primary Diagnosis: Alcohol use disorder, severe, in controlled environment [facility]) Long Term Goal(s): Safe transition to appropriate next level of care at discharge, Engage patient in therapeutic group addressing interpersonal concerns.  Short Term Goals: Engage patient in aftercare planning with referrals and resources,  Increase social support, Increase ability to appropriately verbalize feelings, Increase emotional regulation, Facilitate acceptance of mental health diagnosis and concerns, Facilitate patient progression through stages of change regarding substance use diagnoses and concerns, Identify triggers associated with mental health/substance abuse issues, and Increase skills for wellness and recovery  Therapeutic Interventions: Assess for all discharge needs, 1 to 1 time with Social worker, Explore available resources and support systems, Assess for adequacy in community support network, Educate family and significant other(s) on suicide prevention, Complete Psychosocial Assessment, Interpersonal group therapy.  Evaluation of Outcomes: Not Progressing   Progress in Treatment: Attending groups: No. Participating in groups: No. Taking medication as prescribed: Yes. Toleration medication: Yes. Family/Significant other contact made: No, will contact:  Bekim Werntz (Mom) [phone] Patient understands diagnosis: Yes. Discussing patient identified problems/goals with staff: Yes. Medical  problems stabilized or resolved: Yes. Denies suicidal/homicidal ideation: Yes. Issues/concerns per patient self-inventory: No.  New problem(s) identified:  No  New Short Term/Long Term Goal(s):    medication stabilization, elimination of SI thoughts, development of comprehensive mental wellness plan.    Patient Goals:  I want to get discharged.  I'm already working on stopping drinking.   Discharge Plan or Barriers:  Patient recently admitted. CSW will continue to follow and assess for appropriate referrals and possible discharge planning.    Reason for Continuation of Hospitalization: Anxiety Medication stabilization Suicidal ideation  Estimated Length of Stay:  5 - 7 days  Last 3 [place] Suicide Severity Risk Score: Flowsheet Row Ad[facility] (Current) [facility] from 11/29/2024 in [facility] Department [facility] ED from 11/21/2024 in [facility] Emergency Department at [facility]  C-SSRS RISK CATEGORY No Risk No Risk No Risk    Last PHQ 2/9 Scores:     No data to display          Scribe for Treatment Team: Nyriah Coote O Zimir Kittleson, LCSWA 12/03/2024 6:35 PM

## 2024-12-03 NOTE — Progress Notes (Addendum)
 [facility] MD Progress Note  12/03/2024 7:39 AM Lawrence Griffith.  MRN:  [id number]  Principal Problem: Alcohol use disorder, severe, in controlled environment [facility]) Diagnosis: Principal Problem:   Alcohol use disorder, severe, in controlled environment (HCC) Active Problems:   Transaminitis   Hepatitis C   Intentional self-harm (HCC)   Generalized anxiety disorder   Disorder of intellectual development   Dyslexia   Nicotine  dependence   Reason for Admission:  Lawrence A Derrion Tritz. is a 28 y.o., male with history of intellectual developmental disorder, dyslexia, alcohol use disorder complicated by alcohol withdrawal seizures presents to [facility] from [facility] ED due to putting knife to neck in a suicidal gesture in the context of severe alcoholic intoxication. He has never been psychiatrically hospitalized but has been to ED on several occasions due to alcohol intoxication.  Ethanol level 397 while in ED  (admitted on 11/30/2024, total  LOS: 3 days )   ASSESSMENT: Patient remains on droplet precautions, flu symptoms are mild and no GI distress reported today.  CIWA scores are downtrending at this time.  He has vitamin D  deficiency, will add on supplementation today.  Based on LFTs today (both AST and ALT 5x the upper limit of normal), which appear to be uptrending, naltrexone  will be reduced back down to 25 mg daily.  PLAN: Safety and Monitoring:             -- Involuntary admission to inpatient psychiatric unit for safety, stabilization and treatment             -- Daily contact with patient to assess and evaluate symptoms and progress in treatment             -- Patient's case to be discussed in multi-disciplinary team meeting             -- Observation Level : q15 minute checks             -- Vital signs: q12 hours             -- Precautions: suicide, elopement, and assault   2. Psychiatric Problems Alcohol Use Disorder-severe, dependence Generalized Anxiety Disorder Intellectual  Developmental Disorder Dyslexia - Continue naltrexone  25 mg, hold further titations - Continue propranolol  10 mg bid -CIWA w/ ativan , score decreased from to 3 today -MVI/Thiamine  -TSH, vitamin D , hepatic function panel ordered -ensure for nutritional supplementation   -PRNs: maalox, milk of magnesia, hydroxyzine, trazodone , ibuprofen  -- As needed agitation protocol in-place   The risks/benefits/side-effects/alternatives to the above medication were discussed in detail with the patient and time was given for questions. The patient consents to medication trial. FDA black box warnings, if present, were discussed.   The patient is agreeable with the medication plan, as above. We will monitor the patient's response to pharmacologic treatment, and adjust medications as necessary.   3. Medical Problems #Influenza A -tamiflu  bid x5 days -menthol  lozenge 3 mg prn -Robitussin 100 mg q4h prn -tylenol  650 mg q6h prn for fever  # Vitamin D  deficiency -Level on 12/21, 18.5 - Start vitamin D  supplementation 2000 units daily  #Hepatitis C -unclear if has been taking epclusa   -Repeat LFTs on 12/21:  AST 255, ALT 204 - Hold further titration of naltrexone  25 mg  Disposition Planning: -- Estimated LOS: 5 days --Estimated Discharge Date 12/05/24 --Barriers to Discharge: alcohol withdrawal -- Discharge Goals: Return home with outpatient referrals for mental health follow-up including medication management/psychotherapy  Subjective: Patient evaluated on the unit this morning.  He reports adequate sleep.  He continues to experience poor appetite, but reports he has been maintaining fluids.  Ongoing contact precautions, is unable to leave room.  He is laying in bed, reports no significant distress related to flu.  Reports some fatigue and malaise.  No vomiting or diarrhea reported today.  Denies any significant mood symptoms, denies any symptoms of depression or anxiety.  He denies any significant  withdrawal symptoms, reports ongoing cravings.  He is responding well to naltrexone , denies any side effects from psychotropic medications.  He denies suicidal ideations, homicidal ideations, and hallucinations.   Objective: Chart Review from last 24 hours:  The patient's chart was reviewed and nursing notes were reviewed. The patient's case was discussed in multidisciplinary team meeting.  - Overnight events to report per chart review / staff report: tested positive for flu, continues to have poor appetite - Patient took all prescribed medications yes - Patient received the following PRNs: robitussin, advil   Current Medications: Current Facility-Administered Medications  Medication Dose Route Frequency Provider Last Rate Last Admin   acetaminophen  (TYLENOL ) tablet 650 mg  650 mg Oral Q6H PRN Lynnette Barter, MD   650 mg at 12/02/24 1958   alum & mag hydroxide-simeth (MAALOX/MYLANTA) 200-200-20 MG/5ML suspension 30 mL  30 mL Oral Q4H PRN Smith, Annie B, NP       haloperidol  (HALDOL ) tablet 5 mg  5 mg Oral TID PRN Smith, Annie B, NP       And   diphenhydrAMINE  (BENADRYL ) capsule 50 mg  50 mg Oral TID PRN Smith, Annie B, NP       haloperidol  lactate (HALDOL ) injection 10 mg  10 mg Intramuscular TID PRN Smith, Annie B, NP       And   diphenhydrAMINE  (BENADRYL ) injection 50 mg  50 mg Intramuscular TID PRN Smith, Annie B, NP       And   LORazepam  (ATIVAN ) injection 2 mg  2 mg Intramuscular TID PRN Smith, Annie B, NP       feeding supplement (ENSURE PLUS HIGH PROTEIN) liquid 237 mL  237 mL Oral BID BM Ji, Andrew, MD       folic acid  (FOLVITE ) tablet 1 mg  1 mg Oral Daily Smith, Annie B, NP   1 mg at 12/02/24 0824   guaiFENesin  (ROBITUSSIN) 100 MG/5ML liquid 5 mL  5 mL Oral Q4H PRN Bobbitt, Shalon E, NP   5 mL at 12/02/24 2058   ibuprofen  (ADVIL ) tablet 400 mg  400 mg Oral Q6H PRN Lynnette Barter, MD   400 mg at 12/02/24 0825   influenza vac split trivalent PF (FLUZONE ) injection 0.5 mL  0.5 mL  Intramuscular Tomorrow-1000 Pashayan, Alexander S, DO       LORazepam  (ATIVAN ) tablet 0-4 mg  0-4 mg Oral Q12H Smith, Annie B, NP   1 mg at 12/02/24 1645   magnesium  hydroxide (MILK OF MAGNESIA) suspension 30 mL  30 mL Oral Daily PRN Smith, Annie B, NP       melatonin tablet 5 mg  5 mg Oral QHS Lynnette Barter, MD   5 mg at 12/02/24 2058   menthol  (CEPACOL) lozenge 3 mg  1 lozenge Oral PRN Lynnette Barter, MD       multivitamin with minerals tablet 1 tablet  1 tablet Oral Daily Smith, Annie B, NP   1 tablet at 12/02/24 0825   naltrexone  (DEPADE) tablet 50 mg  50 mg Oral QHS Ji, Andrew, MD   50 mg at 12/02/24 2057  nicotine  (NICODERM CQ  - dosed in mg/24 hours) patch 14 mg  14 mg Transdermal Daily Pashayan, Alexander S, DO   14 mg at 12/02/24 0825   ondansetron  (ZOFRAN ) tablet 8 mg  8 mg Oral Q8H PRN Lynnette Barter, MD   8 mg at 12/02/24 0825   oseltamivir  (TAMIFLU ) capsule 75 mg  75 mg Oral Q12H Ji, Andrew, MD   75 mg at 12/02/24 2000   propranolol  (INDERAL ) tablet 10 mg  10 mg Oral BID Ji, Andrew, MD   10 mg at 12/02/24 1645   thiamine  (Vitamin B-1) tablet 100 mg  100 mg Oral Daily Smith, Annie B, NP   100 mg at 12/02/24 9175    Lab Results:  Results for orders placed or performed during the hospital encounter of 11/30/24 (from the past 48 hours)  Resp panel by RT-PCR (RSV, Flu A&B, Covid) Anterior Nasal Swab     Status: Abnormal   Collection Time: 12/01/24  9:39 PM   Specimen: Anterior Nasal Swab  Result Value Ref Range   SARS Coronavirus 2 by RT PCR NEGATIVE NEGATIVE    Comment: (NOTE) SARS-CoV-2 target nucleic acids are NOT DETECTED.  The SARS-CoV-2 RNA is generally detectable in upper respiratory specimens during the acute phase of infection. The lowest concentration of SARS-CoV-2 viral copies this assay can detect is 138 copies/mL. A negative result does not preclude SARS-Cov-2 infection and should not be used as the sole basis for treatment or other patient management decisions. A negative  result may occur with  improper specimen collection/handling, submission of specimen other than nasopharyngeal swab, presence of viral mutation(s) within the areas targeted by this assay, and inadequate number of viral copies(<138 copies/mL). A negative result must be combined with clinical observations, patient history, and epidemiological information. The expected result is Negative.  Fact Sheet for Patients:  bloggercourse.com  Fact Sheet for Healthcare Providers:  seriousbroker.it  This test is no t yet approved or cleared by the [place]  FDA and  has been authorized for detection and/or diagnosis of SARS-CoV-2 by FDA under an Emergency Use Authorization (EUA). This EUA will remain  in effect (meaning this test can be used) for the duration of the COVID-19 declaration under Section 564(b)(1) of the Act, 21 U.S.C.section 360bbb-3(b)(1), unless the authorization is terminated  or revoked sooner.       Influenza A by PCR POSITIVE (A) NEGATIVE   Influenza B by PCR NEGATIVE NEGATIVE    Comment: (NOTE) The Xpert Xpress SARS-CoV-2/FLU/RSV plus assay is intended as an aid in the diagnosis of influenza from Nasopharyngeal swab specimens and should not be used as a sole basis for treatment. Nasal washings and aspirates are unacceptable for Xpert Xpress SARS-CoV-2/FLU/RSV testing.  Fact Sheet for Patients: bloggercourse.com  Fact Sheet for Healthcare Providers: seriousbroker.it  This test is not yet approved or cleared by the [place]  FDA and has been authorized for detection and/or diagnosis of SARS-CoV-2 by FDA under an Emergency Use Authorization (EUA). This EUA will remain in effect (meaning this test can be used) for the duration of the COVID-19 declaration under Section 564(b)(1) of the Act, 21 U.S.C. section 360bbb-3(b)(1), unless the authorization is terminated  or revoked.     Resp Syncytial Virus by PCR NEGATIVE NEGATIVE    Comment: (NOTE) Fact Sheet for Patients: bloggercourse.com  Fact Sheet for Healthcare Providers: seriousbroker.it  This test is not yet approved or cleared by the [place]  FDA and has been authorized for detection and/or diagnosis of SARS-CoV-2 by  FDA under an Emergency Use Authorization (EUA). This EUA will remain in effect (meaning this test can be used) for the duration of the COVID-19 declaration under Section 564(b)(1) of the Act, 21 U.S.C. section 360bbb-3(b)(1), unless the authorization is terminated or revoked.  Performed at [facility], [address], [place], [place] [address]   Hepatic function panel     Status: Abnormal   Collection Time: 12/02/24  6:48 PM  Result Value Ref Range   Total Protein 8.3 (H) 6.5 - 8.1 g/dL   Albumin 4.6 3.5 - 5.0 g/dL   AST 744 (H) 15 - 41 U/L   ALT 204 (H) 0 - 44 U/L   Alkaline Phosphatase 63 38 - 126 U/L   Total Bilirubin 0.7 0.0 - 1.2 mg/dL   Bilirubin, Direct 0.3 (H) 0.0 - 0.2 mg/dL   Indirect Bilirubin 0.3 0.3 - 0.9 mg/dL    Comment: Performed at [facility], [address] [place], [place] [address]  TSH     Status: None   Collection Time: 12/02/24  6:48 PM  Result Value Ref Range   TSH 1.300 0.350 - 4.500 uIU/mL    Comment: Performed at [facility], [address], [place], [place] [address]  VITAMIN D  25 Hydroxy (Vit-D Deficiency, Fractures)     Status: Abnormal   Collection Time: 12/02/24  6:48 PM  Result Value Ref Range   Vit D, 25-Hydroxy 18.5 (L) 30 - 100 ng/mL    Comment: (NOTE) Vitamin D  deficiency has been defined by the Institute of Medicine  and an Endocrine Society practice guideline as a level of serum 25-OH  vitamin D  less than 20 ng/mL (1,2). The Endocrine Society went on to  further define vitamin D  insufficiency as a level  between 21 and 29  ng/mL (2).  1. IOM (Institute of Medicine). 2010. Dietary reference intakes for  calcium and D. [place]: [employer]. 2. Holick MF, Binkley [place], Bischoff-Ferrari HA, et al. Evaluation,  treatment, and prevention of vitamin D  deficiency: an Endocrine  Society clinical practice guideline, JCEM. 2011 Jul; 96(7): 1911-30.  Performed at [facility], [address], [place], [place] [address]     Blood Alcohol level:  Lab Results  Component Value Date   ETH [facility]) 11/29/2024   ETH >450 (HH) 11/13/2024    Metabolic Labs: No results found for: HGBA1C, MPG No results found for: PROLACTIN No results found for: CHOL, TRIG, HDL, CHOLHDL, VLDL, LDLCALC  Physical Findings:   CIWA:  CIWA-Ar Total: 6  Psychiatric Specialty Exam: General Appearance: Caucasian male appear stated age, appropriately dressed  Eye Contact: minimal  Speech: Clear and coherent  Volume: appropriate  Mood: fine  Affect: constricted  Thought Content: preoccupied with flu and hospitalization  Suicidal Thoughts: denies  Homicidal Thoughts: denies  Thought Process: coherent, goal directed  Orientation: axox3    Memory: fair  Judgment: poor  Insight: poor  Concentration: fair  Recall: fair  Fund of Knowledge: fair  Language: fair  Psychomotor Activity: decreased  Assets: resilience  Sleep: poor   Review of Systems Review of Systems  Constitutional:  Positive for diaphoresis and malaise/fatigue. Negative for chills and fever.  Respiratory:  Negative for shortness of breath.   Gastrointestinal:  Negative for abdominal pain, diarrhea and vomiting.  Neurological:  Negative for dizziness.    Vital Signs:  Blood pressure 130/81, pulse 94, temperature 97.8 F (36.6 C), resp. rate 18, height 5'  8 (1.727 m), weight 83.5 kg, SpO2 95%. Body mass index is 27.98 kg/m. Physical Exam Vitals and nursing note reviewed.  Constitutional:       General: He is not in acute distress.    Appearance: Normal appearance. He is diaphoretic.  HENT:     Head: Normocephalic and atraumatic.  Eyes:     Extraocular Movements: Extraocular movements intact.     Conjunctiva/sclera: Conjunctivae normal.  Pulmonary:     Effort: Pulmonary effort is normal. No respiratory distress.  Neurological:     General: No focal deficit present.     Mental Status: He is alert.     I certify that inpatient services furnished can reasonably be expected to improve the patient's condition.   Signed: Marlo Masson, MD 12/03/2024, 7:39 AM

## 2024-12-03 NOTE — Group Note (Unsigned)
 Date:  12/03/2024 Time:  9:16 AM  Group Topic/Focus:  Emotional Education:   The focus of this group is to discuss what feelings/emotions are, and how they are experienced. Goals Group:   The focus of this group is to help patients establish daily goals to achieve during treatment and discuss how the patient can incorporate goal setting into their daily lives to aide in recovery.     Participation Level:  {BHH PARTICIPATION OZCZO:77735}  Participation Quality:  {BHH PARTICIPATION QUALITY:22265}  Affect:  {BHH AFFECT:22266}  Cognitive:  {BHH COGNITIVE:22267}  Insight: {BHH Insight2:20797}  Engagement in Group:  {BHH ENGAGEMENT IN HMNLE:77731}  Modes of Intervention:  {BHH MODES OF INTERVENTION:22269}  Additional Comments:  ***  Lawrence Griffith 12/03/2024, 9:16 AM

## 2024-12-03 NOTE — Plan of Care (Signed)

## 2024-12-03 NOTE — Plan of Care (Signed)
  Problem: Education: Goal: Emotional status will improve Outcome: Progressing   Problem: Education: Goal: Verbalization of understanding the information provided will improve Outcome: Progressing

## 2024-12-03 NOTE — Group Note (Signed)
 Date:  12/03/2024 Time:  10:00 PM  Group Topic/Focus:  Healthy Communication:   The focus of this group is to discuss communication, barriers to communication, as well as healthy ways to communicate with others.    Participation Level:  Did Not Attend   Juliene CHRISTELLA Huddle 12/03/2024, 10:00 PM

## 2024-12-03 NOTE — Progress Notes (Signed)
(  Sleep Hours) -8  (Any PRNs that were needed, meds refused, or side effects to meds)- Tylenol   (Any disturbances and when (visitation, over night)-none  (Concerns raised by the patient)- none  (SI/HI/AVH)- Denies

## 2024-12-03 NOTE — Group Note (Signed)
 Date:  12/03/2024 Time:  9:26 AM  Group Topic/Focus:  Emotional Education:   The focus of this group is to discuss what feelings/emotions are, and how they are experienced. Goals Group:   The focus of this group is to help patients establish daily goals to achieve during treatment and discuss how the patient can incorporate goal setting into their daily lives to aide in recovery.    Participation Level:  Did Not Attend  Lawrence Griffith Molly 12/03/2024, 9:26 AM
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
## 2024-12-03 NOTE — Group Note (Signed)
 Recreation Therapy Group Note   Group Topic:Communication  Group Date: 12/03/2024 Start Time: 9065 End Time: 1010 Facilitators: Devorah Givhan-McCall, LRT,CTRS Location: [address] Dayroom   Group Topic: Communication, Problem Solving   Goal Area(s) Addresses:  Patient will effectively listen to complete activity.  Patient will identify communication skills used to make activity successful.  Patient will identify how skills used during activity can be used to reach post d/c goals.    Behavioral Response:    Intervention: Building Surveyor Activity - Geometric pattern cards, pencils, blank paper    Activity: Geometric Drawings.  Three volunteers from the peer group will be shown an abstract picture with a particular arrangement of geometrical shapes.  Each round, one 'speaker' will describe the pattern, as accurately as possible without revealing the image to the group.  The remaining group members will listen and draw the picture to reflect how it is described to them. Patients with the role of 'listener' cannot ask clarifying questions but, may request that the speaker repeat a direction. Once the drawings are complete, the presenter will show the rest of the group the picture and compare how close each person came to drawing the picture. LRT will facilitate a post-activity discussion regarding effective communication and the importance of planning, listening, and asking for clarification in daily interactions with others.  Education: Environmental consultant, Active listening, Support systems, Discharge planning  Education Outcome: Acknowledges understanding/In group clarification offered/Needs additional education.    Affect/Mood: N/A   Participation Level: Did not attend    Clinical Observations/Individualized Feedback:      Plan: Continue to engage patient in RT group sessions 2-3x/week.   Melenda Bielak-McCall, LRT,CTRS 12/03/2024 12:29 PM

## 2024-12-03 NOTE — Progress Notes (Signed)
(  Sleep Hours) - 8 hours (Any PRNs that were needed, meds refused, or side effects to meds)-  Tylenol  given, Guaifenisen given  (Any disturbances and when (visitation, over night)- None (Concerns raised by the patient)-  None (SI/HI/AVH)-  Denies

## 2024-12-03 NOTE — BHH Group Notes (Signed)
 Patient did not attend the Grief and Loss group.

## 2024-12-03 NOTE — Plan of Care (Signed)
" °  Problem: Coping: Goal: Ability to demonstrate self-control will improve Outcome: Progressing   Problem: Education: Goal: Emotional status will improve 12/03/2024 2127 by Bobbye Rosina SAILOR, RN Outcome: Progressing    Problem: Education: Goal: Mental status will improve 12/03/2024 2127 by Bobbye Rosina SAILOR, RN Outcome: Progressing    Problem: Education: Goal: Verbalization of understanding the information provided will improve Outcome: Progressing   "

## 2024-12-04 LAB — HEPATIC FUNCTION PANEL
ALT: 233 U/L — ABNORMAL HIGH (ref 0–44)
AST: 228 U/L — ABNORMAL HIGH (ref 15–41)
Albumin: 4.1 g/dL (ref 3.5–5.0)
Alkaline Phosphatase: 60 U/L (ref 38–126)
Bilirubin, Direct: <0.1 mg/dL (ref 0.0–0.2)
Total Bilirubin: 0.6 mg/dL (ref 0.0–1.2)
Total Protein: 8.1 g/dL (ref 6.5–8.1)

## 2024-12-04 NOTE — BHH Group Notes (Signed)
 Patient did not attend the Social Work group.

## 2024-12-04 NOTE — Plan of Care (Signed)
   Problem: Education: Goal: Emotional status will improve Outcome: Progressing Goal: Mental status will improve Outcome: Progressing

## 2024-12-04 NOTE — Group Note (Signed)
 Recreation Therapy Group Note   Group Topic:Animal Assisted Therapy   Group Date: 12/04/2024 Start Time: 0945 End Time: 1030 Facilitators: Diane Mochizuki-McCall, LRT,CTRS Location: [address] Dayroom   Animal-Assisted Activity (AAA) Program Checklist/Progress Notes Patient Eligibility Criteria Checklist & Daily Group note for Rec Tx Intervention  AAA/T Program Assumption of Risk Form signed by Patient/ or Parent Legal Guardian Yes  Patient understands his/her participation is voluntary Yes  Behavioral Response:    Education: Charity Fundraiser, Appropriate Animal Interaction   Education Outcome: Acknowledges education.    Affect/Mood: N/A   Participation Level: Did not attend    Clinical Observations/Individualized Feedback:      Plan: Continue to engage patient in RT group sessions 2-3x/week.   Genella Bas-McCall, LRT,CTRS 12/04/2024 12:22 PM

## 2024-12-04 NOTE — Group Note (Signed)
 Date:  12/04/2024 Time:  4:29 PM  Group Topic/Focus:  Wellness Toolbox:   The focus of this group is to discuss various aspects of wellness, balancing those aspects and exploring ways to increase the ability to experience wellness.  Patients will create a wellness toolbox for use upon discharge.    Participation Level:  Did Not Attend   Lawrence Griffith 12/04/2024, 4:29 PM

## 2024-12-04 NOTE — B[facility] Group Notes (Signed)
Patient did not attend the Wrap-up group. 
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
## 2024-12-04 NOTE — Progress Notes (Signed)
" °   12/04/24 0900  Psych Admission Type (Psych Patients Only)  Admission Status Involuntary  Psychosocial Assessment  Patient Complaints Other (Comment) (Headache)  Eye Contact Brief  Facial Expression Anxious  Affect Appropriate to circumstance  Speech Logical/coherent  Interaction Minimal  Motor Activity Slow  Appearance/Hygiene In scrubs  Behavior Characteristics Cooperative  Mood Pleasant  Thought Process  Coherency WDL  Content WDL  Delusions None reported or observed  Perception WDL  Hallucination None reported or observed  Judgment Impaired  Confusion None  Danger to Self  Current suicidal ideation? Denies  Danger to Others  Danger to Others None reported or observed   DAR NOTE: Patient presents with anxious affect and mood.  Denies suicidal thoughts, auditory and visual hallucinations.  Maintained on routine safety checks.  Medications given as prescribed.  Support and encouragement offered as needed.  Patient maintained on droplet precaution for Flu.  Antibiotic therapy continues.  Fluid intake encouraged.  Tylenol  650 mg given for complain of headache with good effect.  Patient is safe on the unit.    "

## 2024-12-04 NOTE — Progress Notes (Signed)
 (  Sleep Hours) - 8.5 (Any PRNs that were needed, meds refused, or side effects to meds)- none (Any disturbances and when (visitation, over night)- none (Concerns raised by the patient)- none  (SI/HI/AVH)- denies

## 2024-12-04 NOTE — Group Note (Signed)
 LCSW Group Therapy Note  Group Date: 12/04/2024 Start Time: 1100 End Time: 1200   Type of Therapy and Topic:  Group Therapy - Healthy vs Unhealthy Coping Skills  Participation Level:  Did Not Attend   Description of Group The focus of this group was to determine what unhealthy coping techniques typically are used by group members and what healthy coping techniques would be helpful in coping with various problems. Patients were guided in becoming aware of the differences between healthy and unhealthy coping techniques. Patients were asked to identify 2-3 healthy coping skills they would like to learn to use more effectively.  Therapeutic Goals Patients learned that coping is what human beings do all day long to deal with various situations in their lives Patients defined and discussed healthy vs unhealthy coping techniques Patients identified their preferred coping techniques and identified whether these were healthy or unhealthy Patients determined 2-3 healthy coping skills they would like to become more familiar with and use more often. Patients provided support and ideas to each other   Summary of Patient Progress:  Pt on room precautions   Therapeutic Modalities Cognitive Behavioral Therapy Motivational Interviewing  [name], LCSWA 12/04/2024  12:21 PM

## 2024-12-04 NOTE — BHH Group Notes (Signed)
 Patient did not attend the Goals group.

## 2024-12-04 NOTE — Plan of Care (Signed)
   Problem: Education: Goal: Emotional status will improve Outcome: Progressing Goal: Mental status will improve Outcome: Progressing   Problem: Activity: Goal: Interest or engagement in activities will improve Outcome: Progressing Goal: Sleeping patterns will improve Outcome: Progressing

## 2024-12-05 MED ORDER — VITAMIN D3 25 MCG PO TABS: 2000.0000 [IU] | ORAL_TABLET | Freq: Every day | ORAL | Status: AC

## 2024-12-05 MED ORDER — ENSURE PLUS HIGH PROTEIN PO LIQD: 237.0000 mL | Freq: Two times a day (BID) | ORAL | Status: AC

## 2024-12-05 MED ORDER — NICOTINE 14 MG/24HR TD PT24: 14.0000 mg | MEDICATED_PATCH | Freq: Every day | TRANSDERMAL | 0 refills | Status: AC

## 2024-12-05 MED ORDER — ADULT MULTIVITAMIN W/MINERALS CH: 1.0000 | ORAL_TABLET | Freq: Every day | ORAL | Status: AC

## 2024-12-05 MED ORDER — MELATONIN 5 MG PO TABS: 5.0000 mg | ORAL_TABLET | Freq: Every day | ORAL | Status: AC

## 2024-12-05 MED ORDER — NALTREXONE HCL 50 MG PO TABS: 25.0000 mg | ORAL_TABLET | Freq: Every day | ORAL | 0 refills | Status: AC

## 2024-12-05 MED ORDER — OSELTAMIVIR PHOSPHATE 75 MG PO CAPS: 75.0000 mg | ORAL_CAPSULE | Freq: Two times a day (BID) | ORAL | 0 refills | Status: AC

## 2024-12-05 MED ORDER — PROPRANOLOL HCL 10 MG PO TABS: 10.0000 mg | ORAL_TABLET | Freq: Two times a day (BID) | ORAL | 0 refills | Status: AC

## 2024-12-05 MED ORDER — NICOTINE 14 MG/24HR TD PT24: 14.0000 mg | MEDICATED_PATCH | Freq: Every day | TRANSDERMAL | 0 refills | Status: DC

## 2024-12-05 MED ORDER — NICOTINE 14 MG/24HR TD PT24: 14.0000 mg | MEDICATED_PATCH | Freq: Every day | TRANSDERMAL | Status: DC

## 2024-12-05 MED ORDER — FOLIC ACID 1 MG PO TABS: 1.0000 mg | ORAL_TABLET | Freq: Every day | ORAL | Status: AC

## 2024-12-05 MED ORDER — VITAMIN B-1 100 MG PO TABS: 100.0000 mg | ORAL_TABLET | Freq: Every day | ORAL | Status: AC

## 2024-12-05 NOTE — Group Note (Signed)
 Date:  12/05/2024 Time:  10:18 AM  Group Topic/Focus:  Goals Group:   The focus of this group is to help patients establish daily goals to achieve during treatment and discuss how the patient can incorporate goal setting into their daily lives to aide in recovery. Orientation:   The focus of this group is to educate the patient on the purpose and policies of crisis stabilization and provide a format to answer questions about their admission.  The group details unit policies and expectations of patients while admitted.    Participation Level:  Did Not Attend  Lawrence Griffith 12/05/2024, 10:18 AM

## 2024-12-05 NOTE — Discharge Summary (Signed)
 " Physician Discharge Summary Note Patient:  Lawrence Griffith. is an 28 y.o., male MRN:  [id number] DOB:  1996-10-05 Patient phone:  [phone] (home)  Patient address:   [address] [place] [address],  Total Time spent with patient: 30 minutes  Date of Admission:  11/30/2024 Date of Discharge: 12/05/2024   Lawrence Griffith. is a 28 y.o., male with history of intellectual developmental disorder, dyslexia, alcohol use disorder complicated by alcohol withdrawal seizures presents to [facility] from [facility] ED due to putting knife to neck in a suicidal gesture in the context of severe alcoholic intoxication. He has never been psychiatrically hospitalized but has been to ED on several occasions due to alcohol intoxication.  Ethanol level 397 while in ED    During the patient's hospitalization, patient had extensive initial psychiatric evaluation, and follow-up psychiatric evaluations every day.   Psychiatric diagnoses provided upon initial assessment:  Alcohol use disorder, severe, in controlled environment (HCC)  Intentional self-harm (HCC) Generalized anxiety disorder Disorder of intellectual development Dyslexia   Patient's psychiatric medications were adjusted on admission:      Generalized anxiety disorder - Patient was started and continued on propranolol  10 mg twice daily   Alcohol use disorder, severe, dependence Chronic hepatitis C --The patient was started on naltrexone  25 mg at bedtime , initially planned to be titrated to 50 mg, but was continued on 25 mg due to the patient's elevated LFTs. -- LFTs were trended during hospitalization, unclear if patient had been taking home Epclusa . --The patient was placed on CIWA protocol with as needed Ativan   -- Resources provided for patient to follow-up with ID services for treatment of chronic hepatitis -- The patient was also started on multivitamins, folate, and thiamine  100 mg daily -- On 12/01/2024 the patient tested positive  for influenza A, was isolated and placed on droplet precautions.  This limited his interaction and the unit milieu for the remainder of the hospitalization.  Tamiflu  was started on same day.  Patient was asymptomatic by day of discharge.     Patient's care was discussed during the interdisciplinary team meeting every day during the hospitalization.   The patient denies having side effects to prescribed psychiatric medication.   Gradually, patient started adjusting to milieu. The patient was evaluated each day by a clinical provider to ascertain response to treatment. Improvement was noted by the patient's report of decreasing symptoms, improved sleep and appetite, affect, medication tolerance, behavior, and participation in unit programming.  Patient was asked each day to complete a self inventory noting mood, mental status, pain, new symptoms, anxiety and concerns.     Symptoms were reported as significantly decreased or resolved completely by discharge.    On day of discharge, the patient reports that their mood is stable. The patient denied having suicidal thoughts for more than 48 hours prior to discharge.  Patient denies having homicidal thoughts.  Patient denies having auditory hallucinations.  Patient denies any visual hallucinations or other symptoms of psychosis. The patient was motivated to continue taking medication with a goal of continued improvement in mental health.    The patient reports their target psychiatric symptoms of anxiety, alcohol cravings and withdrawals all responded well to the psychiatric medications, and the patient reports overall benefit other psychiatric hospitalization. Supportive psychotherapy was provided to the patient. The patient also participated in regular group therapy while hospitalized. Coping skills, problem solving as well as relaxation therapies were also part of the unit programming.   Labs  were reviewed with the patient, and abnormal results were  discussed with the patient.   The patient is able to verbalize their individual safety plan to this provider.   # It is recommended to the patient to continue psychiatric medications as prescribed, after discharge from the hospital.     # It is recommended to the patient to follow up with your outpatient psychiatric provider and PCP.   # It was discussed with the patient, the impact of alcohol, drugs, tobacco have been there overall psychiatric and medical wellbeing, and total abstinence from substance use was recommended the patient.ed.   # Prescriptions provided or sent directly to preferred pharmacy at discharge. Patient agreeable to plan. Given opportunity to ask questions. Appears to feel comfortable with discharge.    # In the event of worsening symptoms, the patient is instructed to call the crisis hotline, 911 and or go to the nearest ED for appropriate evaluation and treatment of symptoms. To follow-up with primary care provider for other medical issues, concerns and or health care needs   # Patient was discharged home with a plan to follow up as noted below.     Principal Problem: Alcohol use disorder, severe, in controlled environment [facility]) Discharge Diagnoses: Principal Problem:   Alcohol use disorder, severe, in controlled environment (HCC) Active Problems:   Transaminitis   Hepatitis C   Intentional self-harm (HCC)   Generalized anxiety disorder   Disorder of intellectual development   Dyslexia   Nicotine  dependence   Past Psychiatric History: Previous psych diagnoses: IDD, Alcohol Use Disorder Prior inpatient psychiatric treatment: denies Prior outpatient psychiatric treatment: denies Current psychiatric provider: none Current therapist:none   History of suicide attempts: none History of homicide: none   Past Psychotropics: none   Substance Use History: Alcohol: 6-15 12 oz beer per day Tobacco: denies Illicit Substance: denies Cannabis: former use  Past  Medical/Surgical History:  Medical Diagnoses: hepatitis C Home Rx: Epclusa   Prior Hosp: 2x for alcohol withdrawal    Head trauma: denies LOC: denies Seizures: alcohol withdrawal seizure  Social History:  Abuse: denies Marital Status: single Children: none Employment: on disability for IDD Housing: homeless, stays in car with mom, dad, and brother. Currently looking for apartment Finances: disability check $900/month Legal: denies Weapons: denies  Past Medical History:  Past Medical History:  Diagnosis Date   Acute metabolic encephalopathy 11/17/2023   Acute respiratory failure with hypoxia (HCC) 11/16/2023   Alcohol withdrawal seizure (HCC) 08/05/2024   Alcoholic hepatitis (HCC) 08/05/2024   Asthma    Closed fracture of scaphoid bone of wrist 07/16/2021   Hypokalemia 11/18/2023   Multifocal pneumonia 11/17/2023   Nausea and vomiting 08/05/2024   Severe sepsis (HCC) 11/16/2023   Sinusitis 11/16/2023   Sprain of acromioclavicular ligament 07/28/2023    Past Surgical History:  Procedure Laterality Date   ORIF TIBIA PLATEAU Left 05/13/2020   Procedure: OPEN REDUCTION INTERNAL FIXATION (ORIF) OF LEFT LATERAL TIBIAL PLATEAU FRACTURE;  Surgeon: Tobie Priest, MD;  Location: [facility] ORS;  Service: Orthopedics;  Laterality: Left;    Family History:  History reviewed. No pertinent family history.  Social History:  Social History   Socioeconomic History   Marital status: Single    Spouse name: Not on file   Number of children: Not on file   Years of education: Not on file   Highest education level: 12th grade  Occupational History   Not on file  Tobacco Use   Smoking status: Never   Smokeless tobacco:  Current    Types: Chew  Vaping Use   Vaping status: Never Used  Substance and Sexual Activity   Alcohol use: Yes    Alcohol/week: 6.0 standard drinks of alcohol    Types: 6 Cans of beer per week    Comment: 15 cans beer a day previously   Drug use: Not Currently     Frequency: 7.0 times per week    Types: Marijuana   Sexual activity: Yes    Birth control/protection: Condom  Other Topics Concern   Not on file  Social History Narrative   Not on file   Social Drivers of Health   Tobacco Use: High Risk (11/30/2024)   Patient History    Smoking Tobacco Use: Never    Smokeless Tobacco Use: Current    Passive Exposure: Not on file  Financial Resource Strain: Not on file  Food Insecurity: No Food Insecurity (11/30/2024)   Epic    Worried About Programme Researcher, Broadcasting/film/video in the Last Year: Never true    Ran Out of Food in the Last Year: Never true  Transportation Needs: No Transportation Needs (11/30/2024)   Epic    Lack of Transportation (Medical): No    Lack of Transportation (Non-Medical): No  Physical Activity: Not on file  Stress: Not on file  Social Connections: Socially Isolated (11/30/2024)   Social Connection and Isolation Panel    Frequency of Communication with Friends and Family: More than three times a week    Frequency of Social Gatherings with Friends and Family: More than three times a week    Attends Religious Services: Patient declined    Active Member of Clubs or Organizations: No    Attends Banker Meetings: Never    Marital Status: Never married  Intimate Partner Violence: Not At Risk (11/30/2024)   Epic    Fear of Current or Ex-Partner: No    Emotionally Abused: No    Physically Abused: No    Sexually Abused: No  Depression (PHQ2-9): Not on file  Alcohol Screen: High Risk (12/01/2024)   Alcohol Screen    Last Alcohol Screening Score (AUDIT): 34  Housing: Patient Declined (11/30/2024)   Epic    Unable to Pay for Housing in the Last Year: Patient declined    Number of Times Moved in the Last Year: Not on file    Homeless in the Last Year: Patient declined  Utilities: Not At Risk (11/30/2024)   Epic    Threatened with loss of utilities: No  Health Literacy: Not on file    Blood pressure (!) 121/90, pulse 94,  temperature 98.8 F (37.1 C), temperature source Oral, resp. rate 16, height 5' 8 (1.727 m), weight 83.5 kg, SpO2 100%. Body mass index is 27.98 kg/m.  Tobacco Use History[1] Tobacco Cessation:  A prescription for an FDA-approved tobacco cessation medication provided at discharge  Blood Alcohol level:  Lab Results  Component Value Date   ETH 397 (HH) 11/29/2024   ETH >450 (HH) 11/13/2024   Metabolic Disorder Labs:  No results found for: HGBA1C, MPG No results found for: PROLACTIN No results found for: CHOL, TRIG, HDL, CHOLHDL, VLDL, LDLCALC  See Psychiatric Specialty Exam and Suicide Risk Assessment completed by Attending Physician prior to discharge.  Discharge destination:  Home  Is patient on multiple antipsychotic therapies at discharge:  No   Has Patient had three or more failed trials of antipsychotic monotherapy by history:  No  Recommended Plan for Multiple Antipsychotic Therapies: NA  Psychiatric Specialty Exam:   Presentation  General Appearance: Appropriate for environment; Fairly Groomed Eye Contact: Appropriate Speech: Clear and Coherent Speech Volume: Normal Handedness: Unable to assess   Mood and Affect  Mood: Reports doing well Affect: Congruent; Euthymic; Full Range   Thought Process  Thought Processes:Coherent, Linear, Goal Directed Descriptions of Associations: Intact Orientation:Full (Person, place, time) Thought Content:Linear; Logical  History of Schizophrenia/Schizoaffective disorder: None Duration of Psychotic Symptoms: None Hallucinations: None Ideas of Reference: None Suicidal Thoughts: None Homicidal Thoughts: None   Sensorium  Memory: Good Judgment: Fair Insight: Limited   Executive Functions  Concentration: Good Attention Span: Good Recall: Good Fund of Knowledge: Good Language: Good   Psychomotor Activity  Psychomotor Activity: Normal   Assets  Assets: Communication Skills; Desire for Improvement;  Social Support   Sleep  Sleep: Good      Review of Systems  Respiratory:  Negative for shortness of breath.   Cardiovascular:  Negative for chest pain.  Gastrointestinal:  Negative for abdominal pain, constipation, diarrhea, nausea and vomiting.  Musculoskeletal: Negative for joint pain and myalgias.  Neurological:  Negative for headaches.      Physical Exam General: No acute distress. Awake and conversant. Eyes: Normal conjunctiva, anicteric. Round symmetric pupils. ENT: Hearing grossly intact. No nasal discharge. Neck: Neck is supple. No masses or thyromegaly. Respiratory: Respirations are non-labored. No wheezing. Skin: Warm. No rashes or ulcers. Psych: Alert and oriented. Cooperative, Appropriate mood and affect, Normal judgment. CV: No lower extremity edema. MSK: Normal ambulation. No clubbing or cyanosis. Neuro: Sensation and CN II-XII grossly normal.     Blood pressure (!) 121/90, pulse 94, temperature 98.8 F (37.1 C), temperature source Oral, resp. rate 16, height 5' 8 (1.727 m), weight 83.5 kg, SpO2 100%. Body mass index is 27.98 kg/m.    Allergies as of 12/05/2024       Reactions   Clarithromycin    Other reaction(s): HIVES   Penicillamine Hives   Penicillins Hives   Has patient had a PCN reaction causing immediate rash, facial/tongue/throat swelling, SOB or lightheadedness with hypotension: Yes Has patient had a PCN reaction causing severe rash involving mucus membranes or skin necrosis: No Has patient had a PCN reaction that required hospitalization No Has patient had a PCN reaction occurring within the last 10 years: Yes If all of the above answers are NO, then may proceed with Cephalosporin use.   Sulfa Antibiotics    Other reaction(s): HIVES        Medication List     STOP taking these medications    ondansetron  4 MG disintegrating tablet Commonly known as: ZOFRAN -ODT       TAKE these medications      Indication  feeding supplement  Liqd Take 237 mLs by mouth 2 (two) times daily between meals.  Indication: nutritional deficiency   folic acid  1 MG tablet Commonly known as: FOLVITE  Take 1 tablet (1 mg total) by mouth daily.  Indication: vitamin deficiency   ibuprofen  800 MG tablet Commonly known as: ADVIL  Take 1 tablet (800 mg total) by mouth every 8 (eight) hours as needed.  Indication: Fever   melatonin 5 MG Tabs Take 1 tablet (5 mg total) by mouth at bedtime.  Indication: Trouble Sleeping   meloxicam  15 MG tablet Commonly known as: MOBIC  Take 15 mg by mouth daily. Last filled October 04, 2024 at [facility]  Indication: Acute Pain   multivitamin with minerals Tabs tablet Take 1 tablet by mouth daily.  Indication: vitamin deficiency  naltrexone  50 MG tablet Commonly known as: DEPADE Take 0.5 tablets (25 mg total) by mouth at bedtime.  Indication: Abuse or Misuse of Alcohol   nicotine  14 mg/24hr patch Commonly known as: NICODERM CQ  - dosed in mg/24 hours Place 1 patch (14 mg total) onto the skin daily.  Indication: Nicotine  Addiction   oseltamivir  75 MG capsule Commonly known as: TAMIFLU  Take 1 capsule (75 mg total) by mouth every 12 (twelve) hours.  Indication: influenza   propranolol  10 MG tablet Commonly known as: INDERAL  Take 1 tablet (10 mg total) by mouth 2 (two) times daily.  Indication: Feeling Anxious   Sofosbuvir -Velpatasvir  400-100 MG Tabs Commonly known as: Epclusa  Take 1 tablet by mouth daily.  Indication: Chronic Hepatitis C   thiamine  100 MG tablet Commonly known as: Vitamin B-1 Take 1 tablet (100 mg total) by mouth daily.  Indication: Deficiency of Vitamin B1   vitamin D3 25 MCG tablet Commonly known as: CHOLECALCIFEROL  Take 2 tablets (2,000 Units total) by mouth daily.  Indication: Vitamin D  Deficiency        Follow-up Information     [facility], [facility]. Go on 12/17/2024.   Why: You have a hospital follow up appointment on 12/17/24 at 9:00 am. The  appointment will be held in person. Following this appointment, you will be scheduled for a clinical assessment to obtain necessary therapy and medication management services. Contact information: [address] [place] [address] [phone]         Center, [facility] Follow up on 12/07/2024.   Why: Please call this provider on 12/07/24 at 9:00 am to schedule an appointment to establish care for primary care services. Contact information: [address] [place] [address] [phone]         [facility] Follow up on 12/07/2024.   Why: Please call this provider on 12/07/24 at 9:00 am to schedule an appointment to establish care for primary care services. Contact information: [address], [place], [place] [address] Phone: [phone]                 Follow-up recommendations:   Activity:  as tolerated Diet:  heart healthy   Comments:  Prescriptions were given at discharge.  Patient is agreeable with the discharge plan.  Patient was given an opportunity to ask questions.  Patient appears to feel comfortable with discharge and denies any current suicidal or homicidal thoughts.    Patient is instructed prior to discharge to: Take all medications as prescribed by mental healthcare provider. Report any adverse effects and or reactions from the medicines to outpatient provider promptly. In the event of worsening symptoms, patient is instructed to call the crisis hotline, 911 and or go to the nearest ED for appropriate evaluation and treatment of symptoms. Patient is to follow-up with primary care provider for other medical issues, concerns and or health care needs.     Signed: Marlo Masson, MD 12/05/2024, 9:32 AM      [1]  Social History Tobacco Use  Smoking Status Never  Smokeless Tobacco Current   Types: Chew   "

## 2024-12-05 NOTE — Progress Notes (Signed)
" °  [facility] Adult Case Management Discharge Plan :  Will you be returning to the same living situation after discharge:  Yes At discharge, do you have transportation home?: Yes,    Do you have the ability to pay for your medications: Yes,  PRIMARY INS: [employer]  Release of information consent forms completed and in the chart;  Patient's signature needed at discharge.  Patient to Follow up at:  Follow-up Information     [facility], [facility]. Go on 12/17/2024.   Why: You have a hospital follow up appointment on 12/17/24 at 9:00 am. The appointment will be held in person. Following this appointment, you will be scheduled for a clinical assessment to obtain necessary therapy and medication management services. Contact information: [address] [place] [address] [phone]         Center, [facility] Follow up on 12/07/2024.   Why: Please call this provider on 12/07/24 at 9:00 am to schedule an appointment to establish care for primary care services. Contact information: [address] [place] [address] [phone]         [facility] Follow up on 12/07/2024.   Why: Please call this provider on 12/07/24 at 9:00 am to schedule an appointment to establish care for primary care services. Contact information: [address], [place], [place] [address] Phone: [phone]                Next level of care provider has access to [facility] Link:no  Safety Planning and Suicide Prevention discussed: Lawrence Griffith [phone]     Has patient been referred to the Quitline?: Patient refused referral for treatment  Patient has been referred for addiction treatment: Patient refused referral for treatment.  Lawrence Louder, LCSW 12/05/2024, 10:30 AM "

## 2024-12-05 NOTE — Progress Notes (Signed)
 Pt discharged to lobby. Pt was stable and appreciative at that time. All papers and prescriptions were given and valuables returned. Verbal understanding expressed. Denies SI/HI and A/VH. Pt given opportunity to express concerns and ask questions.

## 2024-12-05 NOTE — Group Note (Unsigned)
 Date:  12/05/2024 Time:  9:38 AM  Group Topic/Focus:  Goals Group:   The focus of this group is to help patients establish daily goals to achieve during treatment and discuss how the patient can incorporate goal setting into their daily lives to aide in recovery. Orientation:   The focus of this group is to educate the patient on the purpose and policies of crisis stabilization and provide a format to answer questions about their admission.  The group details unit policies and expectations of patients while admitted.     Participation Level:  {[facility] PARTICIPATION OZCZO:77735}  Participation Quality:  {[facility] PARTICIPATION QUALITY:22265}  Affect:  {[facility] AFFECT:22266}  Cognitive:  {[facility] COGNITIVE:22267}  Insight: {BHH Insight2:20797}  Engagement in Group:  {[facility] ENGAGEMENT IN HMNLE:77731}  Modes of Intervention:  {[facility] MODES OF INTERVENTION:22269}  Additional Comments:  ***  Lauris JONELLE Morales 12/05/2024, 9:38 AM

## 2024-12-05 NOTE — [facility] Suicide Risk Assessment (Signed)
 [facility] INPATIENT:  Family/Significant Other Suicide Prevention Education  Suicide Prevention Education:  Education Completed; -Corrion Stirewalt (Mom) [phone],  (name of family member/significant other) has been identified by the patient as the family member/significant other with whom the patient will be residing, and identified as the person(s) who will aid the patient in the event of a mental health crisis (suicidal ideations/suicide attempt).  With written consent from the patient, the family member/significant other has been provided the following suicide prevention education, prior to the and/or following the discharge of the patient.  The suicide prevention education provided includes the following: Suicide risk factors Suicide prevention and interventions National Suicide Hotline telephone number [facility] assessment telephone number [facility] Emergency Assistance 911 [facility] and/or Residential Mobile Crisis Unit telephone number  Request made of family/significant other to: Remove weapons (e.g., guns, rifles, knives), all items previously/currently identified as safety concern.   Remove drugs/medications (over-the-counter, prescriptions, illicit drugs), all items previously/currently identified as a safety concern.  The family member/significant other verbalizes understanding of the suicide prevention education information provided.  The family member/significant other agrees to remove the items of safety concern listed above.  Faithann Natal LCSW 12/05/2024, 10:20 AM

## 2024-12-05 NOTE — [facility] Suicide Risk Assessment (Addendum)
 Suicide Risk Assessment  Discharge Assessment    [facility] Discharge Suicide Risk Assessment   Principal Problem: Alcohol use disorder, severe, in controlled environment [facility]) Discharge Diagnoses: Principal Problem:   Alcohol use disorder, severe, in controlled environment (HCC) Active Problems:   Transaminitis   Hepatitis C   Intentional self-harm (HCC)   Generalized anxiety disorder   Disorder of intellectual development   Dyslexia   Nicotine  dependence   Total Time spent with patient: 30 minutes  Lawrence Griffith. is a 28 y.o., male with history of intellectual developmental disorder, dyslexia, alcohol use disorder complicated by alcohol withdrawal seizures presents to [facility] from [facility] ED due to putting knife to neck in a suicidal gesture in the context of severe alcoholic intoxication. He has never been psychiatrically hospitalized but has been to ED on several occasions due to alcohol intoxication.  Ethanol level 397 while in ED   During the patient's hospitalization, patient had extensive initial psychiatric evaluation, and follow-up psychiatric evaluations every day.  Psychiatric diagnoses provided upon initial assessment:  Alcohol use disorder, severe, in controlled environment (HCC)  Intentional self-harm (HCC) Generalized anxiety disorder Disorder of intellectual development Dyslexia  Patient's psychiatric medications were adjusted on admission:    Generalized anxiety disorder - Patient was started and continued on propranolol  10 mg twice daily  Alcohol use disorder, severe, dependence Chronic hepatitis C --The patient was started on naltrexone  25 mg at bedtime , initially planned to be titrated to 50 mg, but was continued on 25 mg due to the patient's elevated LFTs. -- LFTs were trended during hospitalization, unclear if patient had been taking home Epclusa . --The patient was placed on CIWA protocol with as needed Ativan   -- Resources provided for patient to follow-up  with ID services for treatment of chronic hepatitis -- The patient was also started on multivitamins, folate, and thiamine  100 mg daily -- On 12/01/2024 the patient tested positive for influenza A, was isolated and placed on droplet precautions.  This limited his interaction and the unit milieu for the remainder of the hospitalization.  Tamiflu  was started on same day.  Patient was asymptomatic by day of discharge.   Patient's care was discussed during the interdisciplinary team meeting every day during the hospitalization.  The patient denies having side effects to prescribed psychiatric medication.  Gradually, patient started adjusting to milieu. The patient was evaluated each day by a clinical provider to ascertain response to treatment. Improvement was noted by the patient's report of decreasing symptoms, improved sleep and appetite, affect, medication tolerance, behavior, and participation in unit programming.  Patient was asked each day to complete a self inventory noting mood, mental status, pain, new symptoms, anxiety and concerns.    Symptoms were reported as significantly decreased or resolved completely by discharge.   On day of discharge, the patient reports that their mood is stable. The patient denied having suicidal thoughts for more than 48 hours prior to discharge.  Patient denies having homicidal thoughts.  Patient denies having auditory hallucinations.  Patient denies any visual hallucinations or other symptoms of psychosis. The patient was motivated to continue taking medication with a goal of continued improvement in mental health.   The patient reports their target psychiatric symptoms of anxiety, alcohol cravings and withdrawals all responded well to the psychiatric medications, and the patient reports overall benefit other psychiatric hospitalization. Supportive psychotherapy was provided to the patient. The patient also participated in regular group therapy while hospitalized.  Coping skills, problem solving as well as  relaxation therapies were also part of the unit programming.  Labs were reviewed with the patient, and abnormal results were discussed with the patient.  The patient is able to verbalize their individual safety plan to this provider.  # It is recommended to the patient to continue psychiatric medications as prescribed, after discharge from the hospital.    # It is recommended to the patient to follow up with your outpatient psychiatric provider and PCP.  # It was discussed with the patient, the impact of alcohol, drugs, tobacco have been there overall psychiatric and medical wellbeing, and total abstinence from substance use was recommended the patient.ed.  # Prescriptions provided or sent directly to preferred pharmacy at discharge. Patient agreeable to plan. Given opportunity to ask questions. Appears to feel comfortable with discharge.    # In the event of worsening symptoms, the patient is instructed to call the crisis hotline, 911 and or go to the nearest ED for appropriate evaluation and treatment of symptoms. To follow-up with primary care provider for other medical issues, concerns and or health care needs  # Patient was discharged home with a plan to follow up as noted below.   Psychiatric Specialty Exam:  Presentation  General Appearance: Appropriate for environment; Fairly Groomed Eye Contact: Appropriate Speech: Clear and Coherent Speech Volume: Normal Handedness: Unable to assess  Mood and Affect  Mood: Reports doing well Affect: Congruent; Euthymic; Full Range  Thought Process  Thought Processes:Coherent, Linear, Goal Directed Descriptions of Associations: Intact Orientation:Full (Person, place, time) Thought Content:Linear; Logical  History of Schizophrenia/Schizoaffective disorder: None Duration of Psychotic Symptoms: None Hallucinations: None Ideas of Reference: None Suicidal Thoughts: None Homicidal Thoughts:  None  Sensorium  Memory: Good Judgment: Fair Insight: Limited  Executive Functions  Concentration: Good Attention Span: Good Recall: Good Fund of Knowledge: Good Language: Good  Psychomotor Activity  Psychomotor Activity: Normal  Assets  Assets: Communication Skills; Desire for Improvement; Social Support  Sleep  Sleep: Good    Review of Systems  Respiratory:  Negative for shortness of breath.   Cardiovascular:  Negative for chest pain.  Gastrointestinal:  Negative for abdominal pain, constipation, diarrhea, nausea and vomiting.  Musculoskeletal: Negative for joint pain and myalgias.  Neurological:  Negative for headaches.    Physical Exam General: No acute distress. Awake and conversant. Eyes: Normal conjunctiva, anicteric. Round symmetric pupils. ENT: Hearing grossly intact. No nasal discharge. Neck: Neck is supple. No masses or thyromegaly. Respiratory: Respirations are non-labored. No wheezing. Skin: Warm. No rashes or ulcers. Psych: Alert and oriented. Cooperative, Appropriate mood and affect, Normal judgment. CV: No lower extremity edema. MSK: Normal ambulation. No clubbing or cyanosis. Neuro: Sensation and CN II-XII grossly normal.    Blood pressure (!) 121/90, pulse 94, temperature 98.8 F (37.1 C), temperature source Oral, resp. rate 16, height 5' 8 (1.727 m), weight 83.5 kg, SpO2 100%. Body mass index is 27.98 kg/m.  Mental Status Per Nursing Assessment::   On Admission:  NA  Demographic factors:  Male, Caucasian, Unemployed Current Mental Status:  NA Loss Factors:  NA Historical Factors:  Impulsivity Risk Reduction Factors:  Positive social support, Living with another person, especially a rel   Continued Clinical Symptoms:  Alcohol/Substance Abuse/Dependencies Previous Psychiatric Diagnoses and Treatments  Cognitive Features That Contribute To Risk:  None    Suicide Risk:  Mild: There are no identifiable suicide plans, no associated  intent, mild dysphoria and related symptoms, good self-control (both objective and subjective assessment), few other risk factors, and identifiable  protective factors, including available and accessible social support.    Follow-up Information     [facility]. Go on 12/17/2024.   Why: You have a hospital follow up appointment on 12/17/24 at 9:00 am. The appointment will be held in person. Following this appointment, you will be scheduled for a clinical assessment to obtain necessary therapy and medication management services. Contact information: [address] [place] [address] [phone]         [facility] Follow up on 12/07/2024.   Why: Please call this provider on 12/07/24 at 9:00 am to schedule an appointment to establish care for primary care services. Contact information: [address] [place] [address] [phone]         [facility] Follow up on 12/07/2024.   Why: Please call this provider on 12/07/24 at 9:00 am to schedule an appointment to establish care for primary care services. Contact information: [address], [place], [place] [address] Phone: [phone]                Plan Of Care/Follow-up recommendations:  Activity: as tolerated  Diet: heart healthy  Other: -Follow-up with your outpatient psychiatric provider -instructions on appointment date, time, and address (location) are provided to you in discharge paperwork.  -Take your psychiatric medications as prescribed at discharge - instructions are provided to you in the discharge paperwork   -Testing: Follow-up with outpatient provider for abnormal lab results:     Latest Ref Rng & Units 12/04/2024    6:41 AM 12/02/2024    6:48 PM 11/13/2024    9:21 PM  Hepatic Function  Total Protein 6.5 - 8.1 g/dL 8.1  8.3  7.6   Albumin 3.5 - 5.0 g/dL 4.1  4.6  4.4   AST 15 - 41 U/L 228  255  129   ALT 0 - 44 U/L  233  204  152   Alk Phosphatase 38 - 126 U/L 60  63  58   Total Bilirubin 0.0 - 1.2 mg/dL 0.6  0.7  0.3   Bilirubin, Direct 0.0 - 0.2 mg/dL <9.8  0.3       Lab Results  Component Value Date   VD25OH 18.5 (L) 12/02/2024     -Recommend abstinence from alcohol, tobacco, and other illicit drug use at discharge.   -If your psychiatric symptoms recur, worsen, or if you have side effects to your psychiatric medications, call your outpatient psychiatric provider, 911, [phone] or go to the nearest emergency department.  -If suicidal thoughts recur, call your outpatient psychiatric provider, 911, [phone] or go to the nearest emergency department.     Marlo Masson, MD 12/05/2024, 9:16 AM

## 2025-01-07 ENCOUNTER — Other Ambulatory Visit (HOSPITAL_COMMUNITY): Payer: Self-pay
